# Patient Record
Sex: Female | Born: 1975
Health system: Southern US, Community
[De-identification: ages and names within clinical notes are randomized; demographics above are authoritative.]

## PROBLEM LIST (undated history)

## (undated) DIAGNOSIS — R87619 Unspecified abnormal cytological findings in specimens from cervix uteri: Secondary | ICD-10-CM

## (undated) DIAGNOSIS — F32A Depression, unspecified: Secondary | ICD-10-CM

## (undated) DIAGNOSIS — M1990 Unspecified osteoarthritis, unspecified site: Secondary | ICD-10-CM

## (undated) DIAGNOSIS — R102 Pelvic and perineal pain unspecified side: Secondary | ICD-10-CM

## (undated) DIAGNOSIS — Z9889 Other specified postprocedural states: Secondary | ICD-10-CM

## (undated) DIAGNOSIS — R112 Nausea with vomiting, unspecified: Secondary | ICD-10-CM

## (undated) DIAGNOSIS — E039 Hypothyroidism, unspecified: Secondary | ICD-10-CM

## (undated) DIAGNOSIS — F1910 Other psychoactive substance abuse, uncomplicated: Secondary | ICD-10-CM

## (undated) DIAGNOSIS — F329 Major depressive disorder, single episode, unspecified: Secondary | ICD-10-CM

## (undated) DIAGNOSIS — N809 Endometriosis, unspecified: Secondary | ICD-10-CM

## (undated) DIAGNOSIS — E079 Disorder of thyroid, unspecified: Secondary | ICD-10-CM

## (undated) HISTORY — DX: Endometriosis, unspecified: N80.9

## (undated) HISTORY — PX: RHINOPLASTY: SUR1284

## (undated) HISTORY — DX: Other psychoactive substance abuse, uncomplicated: F19.10

## (undated) HISTORY — PX: APPENDECTOMY: SHX54

## (undated) HISTORY — DX: Unspecified abnormal cytological findings in specimens from cervix uteri: R87.619

## (undated) HISTORY — DX: Disorder of thyroid, unspecified: E07.9

## (undated) HISTORY — PX: WISDOM TOOTH EXTRACTION: SHX21

---

## 2010-01-21 ENCOUNTER — Encounter: Admission: RE | Admit: 2010-01-21 | Discharge: 2010-01-21 | Payer: Self-pay | Admitting: Obstetrics and Gynecology

## 2010-03-23 ENCOUNTER — Ambulatory Visit: Payer: Self-pay | Admitting: Genetic Counselor

## 2010-06-14 ENCOUNTER — Encounter: Payer: Self-pay | Admitting: Obstetrics and Gynecology

## 2010-07-20 ENCOUNTER — Ambulatory Visit
Admission: RE | Admit: 2010-07-20 | Discharge: 2010-07-20 | Disposition: A | Discharge status: Home / Self Care | Payer: BC Managed Care – PPO | Source: Ambulatory Visit | Attending: Family Medicine | Admitting: Family Medicine

## 2010-07-20 ENCOUNTER — Other Ambulatory Visit: Payer: Self-pay | Admitting: Family Medicine

## 2010-08-06 ENCOUNTER — Other Ambulatory Visit (HOSPITAL_COMMUNITY): Payer: Self-pay | Admitting: Obstetrics and Gynecology

## 2010-08-06 DIAGNOSIS — Z09 Encounter for follow-up examination after completed treatment for conditions other than malignant neoplasm: Secondary | ICD-10-CM

## 2010-08-17 ENCOUNTER — Ambulatory Visit
Admission: RE | Admit: 2010-08-17 | Discharge: 2010-08-17 | Disposition: A | Discharge status: Home / Self Care | Payer: BC Managed Care – PPO | Source: Ambulatory Visit | Attending: Obstetrics and Gynecology | Admitting: Obstetrics and Gynecology

## 2010-08-17 DIAGNOSIS — Z09 Encounter for follow-up examination after completed treatment for conditions other than malignant neoplasm: Secondary | ICD-10-CM

## 2011-02-16 ENCOUNTER — Other Ambulatory Visit: Payer: Self-pay | Admitting: Obstetrics and Gynecology

## 2011-02-16 DIAGNOSIS — R920 Mammographic microcalcification found on diagnostic imaging of breast: Secondary | ICD-10-CM

## 2011-03-03 ENCOUNTER — Ambulatory Visit
Admission: RE | Admit: 2011-03-03 | Discharge: 2011-03-03 | Disposition: A | Discharge status: Home / Self Care | Payer: BC Managed Care – PPO | Source: Ambulatory Visit | Attending: Obstetrics and Gynecology | Admitting: Obstetrics and Gynecology

## 2011-03-03 DIAGNOSIS — R920 Mammographic microcalcification found on diagnostic imaging of breast: Secondary | ICD-10-CM

## 2011-04-29 ENCOUNTER — Ambulatory Visit (INDEPENDENT_AMBULATORY_CARE_PROVIDER_SITE_OTHER): Payer: BC Managed Care – PPO

## 2011-04-29 DIAGNOSIS — B9789 Other viral agents as the cause of diseases classified elsewhere: Secondary | ICD-10-CM

## 2011-04-29 DIAGNOSIS — R509 Fever, unspecified: Secondary | ICD-10-CM

## 2011-05-25 HISTORY — PX: LEEP: SHX91

## 2011-08-05 ENCOUNTER — Other Ambulatory Visit: Payer: Self-pay | Admitting: Family Medicine

## 2011-09-14 ENCOUNTER — Other Ambulatory Visit: Payer: Self-pay | Admitting: Physician Assistant

## 2011-09-14 ENCOUNTER — Other Ambulatory Visit: Payer: Self-pay | Admitting: Family Medicine

## 2011-10-04 ENCOUNTER — Ambulatory Visit: Payer: BC Managed Care – PPO

## 2011-10-04 ENCOUNTER — Ambulatory Visit (INDEPENDENT_AMBULATORY_CARE_PROVIDER_SITE_OTHER): Payer: BC Managed Care – PPO | Admitting: Internal Medicine

## 2011-10-04 VITALS — BP 100/68 | HR 81 | Temp 98.0°F | Resp 16 | Ht 66.5 in | Wt 123.0 lb

## 2011-10-04 DIAGNOSIS — S8000XA Contusion of unspecified knee, initial encounter: Secondary | ICD-10-CM

## 2011-10-04 DIAGNOSIS — M25569 Pain in unspecified knee: Secondary | ICD-10-CM

## 2011-10-04 MED ORDER — MELOXICAM 15 MG PO TABS: 15.0000 mg | ORAL_TABLET | Freq: Every day | ORAL | Status: DC

## 2011-10-04 NOTE — Progress Notes (Signed)
  Subjective:    Patient ID: Crystal Mcknight, female    DOB: Dec 23, 1975, 36 y.o.   MRN: [id number]  HPIDuring an incident with her dog her left knee was slammed into a gait yesterday She has pain and swelling with difficulty weightbearing She is an avid athlete    Review of Systems     Objective:   Physical Exam The left knee has a slight effusion under the patella. There is slight ecchymosis along the lateral patellar but she is most tender on the medial aspect of the patella and up into the medial aspect of the left anterior thigh/There is no laxity to valgus varus or drawer/she has pain throughout her full range of motion and can flex past 90 and fully extend/any patellar ballottement is uncomfortable     [facility] reading (PRIMARY) by  Dr. Merla Riches no fx or patellar disloc     Assessment & Plan:  Problem #1 contusion patellar with strain of the medial muscles about the knee Mobic 15 mg daily Ice 20 minutes 3 times a day Continue range of motion  advance activities as tolerated Recheck in 7-10 days if not improving

## 2011-11-29 ENCOUNTER — Ambulatory Visit (INDEPENDENT_AMBULATORY_CARE_PROVIDER_SITE_OTHER): Payer: BC Managed Care – PPO | Admitting: Family Medicine

## 2011-11-29 ENCOUNTER — Telehealth: Payer: Self-pay

## 2011-11-29 VITALS — BP 128/86 | HR 87 | Temp 98.1°F | Resp 16 | Ht 66.78 in | Wt 125.4 lb

## 2011-11-29 DIAGNOSIS — J029 Acute pharyngitis, unspecified: Secondary | ICD-10-CM

## 2011-11-29 DIAGNOSIS — F411 Generalized anxiety disorder: Secondary | ICD-10-CM

## 2011-11-29 DIAGNOSIS — F419 Anxiety disorder, unspecified: Secondary | ICD-10-CM

## 2011-11-29 MED ORDER — ALPRAZOLAM 0.25 MG PO TABS: 0.2500 mg | ORAL_TABLET | Freq: Every day | ORAL | Status: DC | PRN

## 2011-11-29 MED ORDER — FLUCONAZOLE 150 MG PO TABS: 150.0000 mg | ORAL_TABLET | Freq: Once | ORAL | Status: AC

## 2011-11-29 MED ORDER — CEFDINIR 300 MG PO CAPS: 600.0000 mg | ORAL_CAPSULE | Freq: Every day | ORAL | Status: AC

## 2011-11-29 NOTE — Telephone Encounter (Signed)
[place] HAVE QUESTIONS REGARDING PT'S MEDICINE. PLEASE CALL [phone]

## 2011-11-29 NOTE — Progress Notes (Signed)
@[email or web address] @   Patient ID: Crystal Mcknight MRN: [id number], DOB: 1975/07/26, 36 y.o. Date of Encounter: 11/29/2011, 2:09 PM  Primary Physician: No primary provider on file.  Chief Complaint:  Chief Complaint  Patient presents with  . Sore Throat    x 3 days    Has had strep twice before this year, most recently in May  Also requests refill on Xanax which she takes "rarely" HPI: 36 y.o. year old female presents with day history of sore throat. Subjective fever and chills. No cough, congestion, rhinorrhea, sinus pressure, otalgia, or headache. Normal hearing. No GI complaints. Able to swallow saliva, but hurts to do so. Decreased appetite secondary to sore throat.   No past medical history on file.   Home Meds: Prior to Admission medications   Medication Sig Start Date End Date Taking? Authorizing Provider  LEVOTHROID 150 MCG tablet TAKE 1 TABLET DAILY. TAKE A TABLET WITH THESE ON TUESDAY AND SATURDAYS. 08/05/11  Yes Morrell Riddle, PA-C  LEVOTHROID 25 MCG tablet TAKE 1 TABLET ONCE DAILY ON TUESDAYS AND SATURDAYS ONLY 09/14/11  Yes Ryan M Dunn, PA-C  sertraline (ZOLOFT) 25 MG tablet Take 25 mg by mouth daily.   Yes Historical Provider, MD  ALPRAZolam (XANAX) 0.25 MG tablet Take 1 tablet (0.25 mg total) by mouth daily as needed for sleep. 11/29/11 12/29/11  Elvina Sidle, MD  cefdinir (OMNICEF) 300 MG capsule Take 2 capsules (600 mg total) by mouth daily. 11/29/11 12/09/11  Elvina Sidle, MD  fluconazole (DIFLUCAN) 150 MG tablet Take 1 tablet (150 mg total) by mouth once. 11/29/11 11/30/11  Elvina Sidle, MD    Allergies:  Allergies  Allergen Reactions  . Penicillins Rash    History   Social History  . Marital Status: Married    Spouse Name: N/A    Number of Children: N/A  . Years of Education: N/A   Occupational History  . Not on file.   Social History Main Topics  . Smoking status: Never Smoker   . Smokeless tobacco: Not on file  . Alcohol Use: Not on file  . Drug  Use: Not on file  . Sexually Active: Not on file   Other Topics Concern  . Not on file   Social History Narrative  . No narrative on file     Review of Systems: Constitutional: negative for chills, fever, night sweats or weight changes HEENT: see above Cardiovascular: negative for chest pain or palpitations Respiratory: negative for hemoptysis, wheezing, or shortness of breath Abdominal: negative for abdominal pain, nausea, vomiting or diarrhea Dermatological: negative for rash Neurologic: negative for headache   Physical Exam: Blood pressure 128/86, pulse 87, temperature 98.1 F (36.7 C), temperature source Oral, resp. rate 16, height 5' 6.78" (1.696 m), weight 125 lb 6.4 oz (56.881 kg), last menstrual period 11/12/2011, SpO2 100.00%., Body mass index is 19.77 kg/(m^2). General: Well developed, well nourished, in no acute distress. Head: Normocephalic, atraumatic, eyes without discharge, sclera non-icteric, nares are patent. Bilateral auditory canals clear, TM's are without perforation, pearly grey with reflective cone of light bilaterally. No sinus TTP. Oral cavity moist, dentition normal. Posterior pharynx with post nasal drip and mild erythema. No peritonsillar abscess or tonsillar exudate. Neck: Supple. No thyromegaly. Full ROM. No lymphadenopathy. Lungs: Clear bilaterally to auscultation without wheezes, rales, or rhonchi. Breathing is unlabored. Heart: RRR with S1 S2. No murmurs, rubs, or gallops appreciated. Msk:  Strength and tone normal for age. Extremities: No clubbing or cyanosis. No edema. Neuro: Alert  and oriented X 3. Moves all extremities spontaneously. CNII-XII grossly in tact. Psych:  Responds to questions appropriately with a normal affect.   Labs:   ASSESSMENT AND PLAN:  36 y.o. year old female with recurrent pharyngitis 1. Pharyngitis  cefdinir (OMNICEF) 300 MG capsule, fluconazole (DIFLUCAN) 150 MG tablet  2. Anxiety  ALPRAZolam (XANAX) 0.25 MG tablet     - -Tylenol/Motrin prn -Rest/fluids -RTC precautions -RTC 3-5 days if no improvement  Signed, Elvina Sidle, MD 11/29/2011 2:09 PM

## 2011-11-30 NOTE — Telephone Encounter (Signed)
Pharmacist stated that the pt was able to answer their ? No further ?s.

## 2012-03-22 ENCOUNTER — Ambulatory Visit (INDEPENDENT_AMBULATORY_CARE_PROVIDER_SITE_OTHER): Payer: BC Managed Care – PPO | Admitting: Emergency Medicine

## 2012-03-22 ENCOUNTER — Ambulatory Visit: Payer: BC Managed Care – PPO

## 2012-03-22 VITALS — BP 122/76 | HR 83 | Temp 99.0°F | Resp 17 | Ht 67.0 in | Wt 126.0 lb

## 2012-03-22 DIAGNOSIS — R05 Cough: Secondary | ICD-10-CM

## 2012-03-22 DIAGNOSIS — B373 Candidiasis of vulva and vagina: Secondary | ICD-10-CM

## 2012-03-22 DIAGNOSIS — J40 Bronchitis, not specified as acute or chronic: Secondary | ICD-10-CM

## 2012-03-22 DIAGNOSIS — R059 Cough, unspecified: Secondary | ICD-10-CM

## 2012-03-22 DIAGNOSIS — B3731 Acute candidiasis of vulva and vagina: Secondary | ICD-10-CM

## 2012-03-22 LAB — POCT CBC
Granulocyte percent: 58.2 %G (ref 37–80)
HCT, POC: 43.7 % (ref 37.7–47.9)
Hemoglobin: 13.6 g/dL (ref 12.2–16.2)
MCHC: 31.1 g/dL — AB (ref 31.8–35.4)
MID (cbc): 0.5 (ref 0–0.9)
POC Granulocyte: 4.1 (ref 2–6.9)
POC LYMPH PERCENT: 35.1 %L (ref 10–50)
POC MID %: 6.7 %M (ref 0–12)
Platelet Count, POC: 185 10*3/uL (ref 142–424)
RBC: 4.21 M/uL (ref 4.04–5.48)

## 2012-03-22 MED ORDER — FLUCONAZOLE 150 MG PO TABS: 150.0000 mg | ORAL_TABLET | Freq: Once | ORAL | Status: DC

## 2012-03-22 MED ORDER — AZITHROMYCIN 250 MG PO TABS: ORAL_TABLET | ORAL | Status: DC

## 2012-03-22 MED ORDER — HYDROCODONE-HOMATROPINE 5-1.5 MG/5ML PO SYRP: 5.0000 mL | ORAL_SOLUTION | Freq: Three times a day (TID) | ORAL | Status: DC | PRN

## 2012-03-22 NOTE — Patient Instructions (Addendum)

## 2012-03-22 NOTE — Progress Notes (Signed)
  Subjective:    Patient ID: Crystal Mcknight, female    DOB: April 09, 1976, 36 y.o.   MRN: [id number]  HPI patient presents with a cough and body aches.  Kids have had strep but she doesn't have a sore throat.  Been coughing for two weeks.  Has taken robitussin, nyquil and mucinex.  Has not had the flu shot this year.  Right side of ear and face has been hurting.      Review of Systems     Objective:   Physical Exam H. EENT exam is unremarkable. Her neck is supple. Her chest is clear to both auscultation and percussion. I did not hear any audible wheezes. There were no areas of dullness to percussion. Her cardiac exam is unremarkable  Results for orders placed in visit on 03/22/12  POCT RAPID STREP A (OFFICE)      Component Value Range   Rapid Strep A Screen Negative  Negative  POCT CBC      Component Value Range   WBC 7.1  4.6 - 10.2 K/uL   Lymph, poc 2.5  0.6 - 3.4   POC LYMPH PERCENT 35.1  10 - 50 %L   MID (cbc) 0.5  0 - 0.9   POC MID % 6.7  0 - 12 %M   POC Granulocyte 4.1  2 - 6.9   Granulocyte percent 58.2  37 - 80 %G   RBC 4.21  4.04 - 5.48 M/uL   Hemoglobin 13.6  12.2 - 16.2 g/dL   HCT, POC 40.9  81.1 - 47.9 %   MCV 103.9 (*) 80 - 97 fL   MCH, POC 32.3 (*) 27 - 31.2 pg   MCHC 31.1 (*) 31.8 - 35.4 g/dL   RDW, POC 91.4     Platelet Count, POC 185  142 - 424 K/uL   MPV 9.1  0 - 99.8 fL   [facility] reading (PRIMARY) by  Dr. Cleta Alberts no acute disease     Assessment & Plan:  There is no pneumonia on x-ray she has a dry nonproductive cough. This could be an atypical infection. We'll treat with Z-Pak and Hycodan at night.

## 2012-03-23 LAB — FOLATE: Folate: >20 ng/mL

## 2012-03-23 LAB — VITAMIN B12: Vitamin B-12: 807 pg/mL (ref 211–911)

## 2012-04-18 ENCOUNTER — Other Ambulatory Visit: Payer: Self-pay | Admitting: Obstetrics and Gynecology

## 2012-04-18 DIAGNOSIS — R921 Mammographic calcification found on diagnostic imaging of breast: Secondary | ICD-10-CM

## 2012-05-03 ENCOUNTER — Ambulatory Visit
Admission: RE | Admit: 2012-05-03 | Discharge: 2012-05-03 | Disposition: A | Discharge status: Home / Self Care | Payer: BC Managed Care – PPO | Source: Ambulatory Visit | Attending: Obstetrics and Gynecology | Admitting: Obstetrics and Gynecology

## 2012-05-03 DIAGNOSIS — R921 Mammographic calcification found on diagnostic imaging of breast: Secondary | ICD-10-CM

## 2012-09-28 DIAGNOSIS — M19049 Primary osteoarthritis, unspecified hand: Secondary | ICD-10-CM | POA: Insufficient documentation

## 2012-09-28 DIAGNOSIS — Q740 Other congenital malformations of upper limb(s), including shoulder girdle: Secondary | ICD-10-CM | POA: Insufficient documentation

## 2012-10-18 ENCOUNTER — Telehealth: Payer: Self-pay

## 2012-10-18 NOTE — Telephone Encounter (Signed)
Okay to refill alprazolam x 6 month.  I am out of office for a week.  Can you help me?

## 2012-10-18 NOTE — Telephone Encounter (Signed)
Pharm requests RF of alprazolam 0.25 mg. 

## 2012-10-19 ENCOUNTER — Other Ambulatory Visit: Payer: Self-pay | Admitting: Family Medicine

## 2012-10-19 DIAGNOSIS — F419 Anxiety disorder, unspecified: Secondary | ICD-10-CM

## 2012-10-19 MED ORDER — ALPRAZOLAM 0.25 MG PO TABS: 0.2500 mg | ORAL_TABLET | Freq: Every day | ORAL | Status: AC | PRN

## 2012-10-19 NOTE — Telephone Encounter (Signed)
Will forward to Dr. Elbert Ewings as he is here for clinic

## 2012-10-25 NOTE — Telephone Encounter (Signed)
Rx sent in 10/19/12.

## 2013-03-06 ENCOUNTER — Other Ambulatory Visit: Payer: Self-pay | Admitting: Obstetrics and Gynecology

## 2013-03-06 DIAGNOSIS — R922 Inconclusive mammogram: Secondary | ICD-10-CM

## 2013-03-19 ENCOUNTER — Other Ambulatory Visit: Payer: Self-pay | Admitting: Gastroenterology

## 2013-03-19 DIAGNOSIS — R1032 Left lower quadrant pain: Secondary | ICD-10-CM

## 2013-03-23 ENCOUNTER — Ambulatory Visit
Admission: RE | Admit: 2013-03-23 | Discharge: 2013-03-23 | Disposition: A | Discharge status: Home / Self Care | Payer: BC Managed Care – PPO | Source: Ambulatory Visit | Attending: Gastroenterology | Admitting: Gastroenterology

## 2013-03-23 DIAGNOSIS — R1032 Left lower quadrant pain: Secondary | ICD-10-CM

## 2013-03-23 MED ORDER — IOHEXOL 300 MG/ML  SOLN
100.0000 mL | Freq: Once | INTRAMUSCULAR | Status: AC | PRN
  Administered 2013-03-23: 100 mL via INTRAVENOUS

## 2013-04-25 ENCOUNTER — Other Ambulatory Visit: Payer: Self-pay | Admitting: Radiology

## 2013-04-30 ENCOUNTER — Other Ambulatory Visit: Payer: Self-pay | Admitting: Radiology

## 2013-05-21 ENCOUNTER — Encounter (HOSPITAL_COMMUNITY): Payer: Self-pay | Admitting: Pharmacist

## 2013-05-29 ENCOUNTER — Inpatient Hospital Stay (HOSPITAL_COMMUNITY)
Admission: RE | Admit: 2013-05-29 | Discharge: 2013-05-29 | Disposition: A | Discharge status: Home / Self Care | Payer: BC Managed Care – PPO | Source: Ambulatory Visit

## 2013-05-29 ENCOUNTER — Encounter (HOSPITAL_COMMUNITY): Payer: Self-pay | Admitting: *Deleted

## 2013-05-29 NOTE — Pre-Procedure Instructions (Signed)
Patient no showed for 05/29/12 PAT appt. At 130pm.  Pamala Hurry in Admitting tried to contact patient but no response.

## 2013-05-30 MED ORDER — GENTAMICIN SULFATE 40 MG/ML IJ SOLN
INTRAVENOUS | Status: AC
  Administered 2013-05-31: 113 mL via INTRAVENOUS
  Filled 2013-05-30: qty 7.25

## 2013-05-30 NOTE — H&P (Addendum)
38 yo G1 with chronic pelvic pain presents for diagnostic laparoscopy  PMHx:  Pelvic pain, hypothyroidism, arthritis PSHx:  Appy, SVD x 1, leep All: PCN Meds:  Levothyroxine, zoloft, vitamins, vicoprofen SHx:  occ etoh, no tobacco FHx:  Breast ca  AF, VSS Gen - NAD Abd - soft, NT CV - RRR Lungs - clear PV - nml EFG, uterus with limited mobility, no adnexal masses  A/P:  chornic pelvic pain Diagnostic l/s with possible fulgeration of endometriosis or LOA R/b/a discussed, questions answered, informed consent

## 2013-05-31 ENCOUNTER — Encounter (HOSPITAL_COMMUNITY)
Admission: RE | Disposition: A | Discharge status: Home / Self Care | Payer: Self-pay | Source: Ambulatory Visit | Attending: Obstetrics and Gynecology

## 2013-05-31 ENCOUNTER — Ambulatory Visit (HOSPITAL_COMMUNITY): Payer: BC Managed Care – PPO | Admitting: Anesthesiology

## 2013-05-31 ENCOUNTER — Ambulatory Visit (HOSPITAL_COMMUNITY)
Admission: RE | Admit: 2013-05-31 | Discharge: 2013-05-31 | Disposition: A | Discharge status: Home / Self Care | Payer: BC Managed Care – PPO | Source: Ambulatory Visit | Attending: Obstetrics and Gynecology | Admitting: Obstetrics and Gynecology

## 2013-05-31 ENCOUNTER — Encounter (HOSPITAL_COMMUNITY): Payer: Self-pay | Admitting: *Deleted

## 2013-05-31 ENCOUNTER — Encounter (HOSPITAL_COMMUNITY): Payer: BC Managed Care – PPO | Admitting: Anesthesiology

## 2013-05-31 DIAGNOSIS — N949 Unspecified condition associated with female genital organs and menstrual cycle: Secondary | ICD-10-CM | POA: Insufficient documentation

## 2013-05-31 DIAGNOSIS — N803 Endometriosis of pelvic peritoneum, unspecified: Secondary | ICD-10-CM | POA: Insufficient documentation

## 2013-05-31 DIAGNOSIS — N838 Other noninflammatory disorders of ovary, fallopian tube and broad ligament: Secondary | ICD-10-CM | POA: Insufficient documentation

## 2013-05-31 DIAGNOSIS — G8929 Other chronic pain: Secondary | ICD-10-CM | POA: Insufficient documentation

## 2013-05-31 HISTORY — DX: Unspecified osteoarthritis, unspecified site: M19.90

## 2013-05-31 HISTORY — PX: LAPAROSCOPY: SHX197

## 2013-05-31 HISTORY — DX: Hypothyroidism, unspecified: E03.9

## 2013-05-31 HISTORY — DX: Other specified postprocedural states: R11.2

## 2013-05-31 HISTORY — DX: Other specified postprocedural states: Z98.890

## 2013-05-31 HISTORY — DX: Major depressive disorder, single episode, unspecified: F32.9

## 2013-05-31 HISTORY — DX: Pelvic and perineal pain unspecified side: R10.20

## 2013-05-31 HISTORY — DX: Depression, unspecified: F32.A

## 2013-05-31 HISTORY — DX: Pelvic and perineal pain: R10.2

## 2013-05-31 HISTORY — DX: Nausea with vomiting, unspecified: Z98.890

## 2013-05-31 LAB — CBC
HCT: 41.4 % (ref 36.0–46.0)
Hemoglobin: 14.6 g/dL (ref 12.0–15.0)
MCH: 33.7 pg (ref 26.0–34.0)
MCHC: 35.3 g/dL (ref 30.0–36.0)
MCV: 95.6 fL (ref 78.0–100.0)
Platelets: 198 10*3/uL (ref 150–400)
RBC: 4.33 MIL/uL (ref 3.87–5.11)
RDW: 11.8 % (ref 11.5–15.5)
WBC: 6 10*3/uL (ref 4.0–10.5)

## 2013-05-31 LAB — PREGNANCY, URINE: PREG TEST UR: NEGATIVE

## 2013-05-31 SURGERY — LAPAROSCOPY OPERATIVE
Anesthesia: General | Site: Abdomen

## 2013-05-31 MED ORDER — BUPIVACAINE HCL (PF) 0.25 % IJ SOLN
INTRAMUSCULAR | Status: AC
  Filled 2013-05-31: qty 30

## 2013-05-31 MED ORDER — LACTATED RINGERS IV SOLN
INTRAVENOUS | Status: DC
  Administered 2013-05-31: 1000 mL via INTRAVENOUS
  Administered 2013-05-31 (×2): via INTRAVENOUS

## 2013-05-31 MED ORDER — PROPOFOL 10 MG/ML IV BOLUS
INTRAVENOUS | Status: DC | PRN
  Administered 2013-05-31: 150 mg via INTRAVENOUS

## 2013-05-31 MED ORDER — DEXAMETHASONE SODIUM PHOSPHATE 4 MG/ML IJ SOLN
INTRAMUSCULAR | Status: DC | PRN
  Administered 2013-05-31: 10 mg via INTRAVENOUS

## 2013-05-31 MED ORDER — ONDANSETRON HCL 4 MG/2ML IJ SOLN
INTRAMUSCULAR | Status: DC | PRN
  Administered 2013-05-31: 4 mg via INTRAVENOUS

## 2013-05-31 MED ORDER — FENTANYL CITRATE 0.05 MG/ML IJ SOLN
INTRAMUSCULAR | Status: AC
  Filled 2013-05-31: qty 5

## 2013-05-31 MED ORDER — ROCURONIUM BROMIDE 100 MG/10ML IV SOLN
INTRAVENOUS | Status: AC
  Filled 2013-05-31: qty 1

## 2013-05-31 MED ORDER — ROCURONIUM BROMIDE 100 MG/10ML IV SOLN
INTRAVENOUS | Status: DC | PRN
  Administered 2013-05-31: 20 mg via INTRAVENOUS
  Administered 2013-05-31: 10 mg via INTRAVENOUS

## 2013-05-31 MED ORDER — NEOSTIGMINE METHYLSULFATE 1 MG/ML IJ SOLN
INTRAMUSCULAR | Status: DC | PRN
  Administered 2013-05-31: 3 mg via INTRAVENOUS

## 2013-05-31 MED ORDER — MIDAZOLAM HCL 2 MG/2ML IJ SOLN
INTRAMUSCULAR | Status: DC | PRN
  Administered 2013-05-31: 2 mg via INTRAVENOUS

## 2013-05-31 MED ORDER — NEOSTIGMINE METHYLSULFATE 1 MG/ML IJ SOLN
INTRAMUSCULAR | Status: AC
  Filled 2013-05-31: qty 1

## 2013-05-31 MED ORDER — BUPIVACAINE HCL (PF) 0.25 % IJ SOLN
INTRAMUSCULAR | Status: DC | PRN
  Administered 2013-05-31: 5 mL

## 2013-05-31 MED ORDER — MIDAZOLAM HCL 2 MG/2ML IJ SOLN
INTRAMUSCULAR | Status: AC
  Filled 2013-05-31: qty 2

## 2013-05-31 MED ORDER — PHENYLEPHRINE HCL 10 MG/ML IJ SOLN
INTRAMUSCULAR | Status: DC | PRN
  Administered 2013-05-31: 40 ug via INTRAVENOUS

## 2013-05-31 MED ORDER — GLYCOPYRROLATE 0.2 MG/ML IJ SOLN
INTRAMUSCULAR | Status: DC | PRN
  Administered 2013-05-31: 0.6 mg via INTRAVENOUS

## 2013-05-31 MED ORDER — ONDANSETRON HCL 4 MG/2ML IJ SOLN
INTRAMUSCULAR | Status: AC
  Filled 2013-05-31: qty 2

## 2013-05-31 MED ORDER — FENTANYL CITRATE 0.05 MG/ML IJ SOLN
INTRAMUSCULAR | Status: AC
  Filled 2013-05-31: qty 2

## 2013-05-31 MED ORDER — METOCLOPRAMIDE HCL 5 MG/ML IJ SOLN
INTRAMUSCULAR | Status: DC | PRN
  Administered 2013-05-31: 5 mg via INTRAVENOUS

## 2013-05-31 MED ORDER — METOCLOPRAMIDE HCL 5 MG/ML IJ SOLN
INTRAMUSCULAR | Status: AC
  Filled 2013-05-31: qty 2

## 2013-05-31 MED ORDER — GLYCOPYRROLATE 0.2 MG/ML IJ SOLN
INTRAMUSCULAR | Status: AC
  Filled 2013-05-31: qty 3

## 2013-05-31 MED ORDER — MIDAZOLAM HCL 2 MG/2ML IJ SOLN: INTRAMUSCULAR | Status: DC | PRN

## 2013-05-31 MED ORDER — LIDOCAINE HCL (CARDIAC) 20 MG/ML IV SOLN
INTRAVENOUS | Status: DC | PRN
  Administered 2013-05-31: 50 mg via INTRAVENOUS

## 2013-05-31 MED ORDER — KETOROLAC TROMETHAMINE 30 MG/ML IJ SOLN
INTRAMUSCULAR | Status: DC | PRN
  Administered 2013-05-31: 30 mg via INTRAVENOUS

## 2013-05-31 MED ORDER — FENTANYL CITRATE 0.05 MG/ML IJ SOLN
25.0000 ug | INTRAMUSCULAR | Status: DC | PRN
  Administered 2013-05-31: 50 ug via INTRAVENOUS

## 2013-05-31 MED ORDER — DEXAMETHASONE SODIUM PHOSPHATE 10 MG/ML IJ SOLN
INTRAMUSCULAR | Status: AC
  Filled 2013-05-31: qty 1

## 2013-05-31 MED ORDER — FENTANYL CITRATE 0.05 MG/ML IJ SOLN
INTRAMUSCULAR | Status: DC | PRN
  Administered 2013-05-31 (×5): 50 ug via INTRAVENOUS

## 2013-05-31 MED ORDER — KETOROLAC TROMETHAMINE 30 MG/ML IJ SOLN
INTRAMUSCULAR | Status: AC
  Filled 2013-05-31: qty 1

## 2013-05-31 SURGICAL SUPPLY — 30 items
BARRIER ADHS 3X4 INTERCEED (GAUZE/BANDAGES/DRESSINGS) IMPLANT
CABLE HIGH FREQUENCY MONO STRZ (ELECTRODE) ×3 IMPLANT
CATH ROBINSON RED A/P 16FR (CATHETERS) ×3 IMPLANT
CHLORAPREP W/TINT 26ML (MISCELLANEOUS) ×6 IMPLANT
CLOSURE WOUND 1/2 X4 (GAUZE/BANDAGES/DRESSINGS)
CLOTH BEACON ORANGE TIMEOUT ST (SAFETY) ×3 IMPLANT
DERMABOND ADVANCED (GAUZE/BANDAGES/DRESSINGS) ×2
DERMABOND ADVANCED .7 DNX12 (GAUZE/BANDAGES/DRESSINGS) ×1 IMPLANT
GLOVE BIO SURGEON STRL SZ 6.5 (GLOVE) ×2 IMPLANT
GLOVE BIO SURGEONS STRL SZ 6.5 (GLOVE) ×1
GLOVE BIOGEL PI IND STRL 7.0 (GLOVE) ×1 IMPLANT
GLOVE BIOGEL PI INDICATOR 7.0 (GLOVE) ×2
GOWN PREVENTION PLUS LG XLONG (DISPOSABLE) ×6 IMPLANT
NS IRRIG 1000ML POUR BTL (IV SOLUTION) ×3 IMPLANT
PACK LAPAROSCOPY BASIN (CUSTOM PROCEDURE TRAY) ×3 IMPLANT
POUCH SPECIMEN RETRIEVAL 10MM (ENDOMECHANICALS) IMPLANT
PROTECTOR NERVE ULNAR (MISCELLANEOUS) ×3 IMPLANT
SEALER TISSUE G2 CVD JAW 35 (ENDOMECHANICALS) IMPLANT
SEALER TISSUE G2 CVD JAW 45CM (ENDOMECHANICALS) IMPLANT
SET IRRIG TUBING LAPAROSCOPIC (IRRIGATION / IRRIGATOR) IMPLANT
STRIP CLOSURE SKIN 1/2X4 (GAUZE/BANDAGES/DRESSINGS) IMPLANT
SUT VIC AB 3-0 PS2 18 (SUTURE) ×2
SUT VIC AB 3-0 PS2 18XBRD (SUTURE) ×1 IMPLANT
SUT VICRYL 0 UR6 27IN ABS (SUTURE) ×3 IMPLANT
TOWEL OR 17X24 6PK STRL BLUE (TOWEL DISPOSABLE) ×6 IMPLANT
TROCAR OPTI TIP 5M 100M (ENDOMECHANICALS) ×3 IMPLANT
TROCAR XCEL NON-BLD 11X100MML (ENDOMECHANICALS) ×3 IMPLANT
TROCAR XCEL OPT SLVE 5M 100M (ENDOMECHANICALS) IMPLANT
WARMER LAPAROSCOPE (MISCELLANEOUS) ×3 IMPLANT
WATER STERILE IRR 1000ML POUR (IV SOLUTION) ×3 IMPLANT

## 2013-05-31 NOTE — Discharge Instructions (Signed)
DISCHARGE INSTRUCTIONS: Laparoscopy  MAY TAKE IBUPROFEN (MOTRIN, ADVIL) OR ALEVE AFTER 2:00 PM FOR CRAMPS OR TENDERNESS!!  The following instructions have been prepared to help you care for yourself upon your return home today.  Wound care:  Do not get the incision wet for the first 24 hours. The incision should be kept clean and dry.  The Band-Aids or dressings may be removed the day after surgery.  Should the incision become sore, red, and swollen after the first week, check with your doctor.  Personal hygiene:  Shower the day after your procedure.  Activity and limitations:  Do NOT drive or operate any equipment today.  Do NOT lift anything more than 15 pounds for 2-3 weeks after surgery.  Do NOT rest in bed all day.  Walking is encouraged. Walk each day, starting slowly with 5-minute walks 3 or 4 times a day. Slowly increase the length of your walks.  Walk up and down stairs slowly.  Do NOT do strenuous activities, such as golfing, playing tennis, bowling, running, biking, weight lifting, gardening, mowing, or vacuuming for 2-4 weeks. Ask your doctor when it is okay to start.  Diet: Eat a light meal as desired this evening. You may resume your usual diet tomorrow.  Return to work: This is dependent on the type of work you do. For the most part you can return to a desk job within a week of surgery. If you are more active at work, please discuss this with your doctor.  What to expect after your surgery: You may have a slight burning sensation when you urinate on the first day. You may have a very small amount of blood in the urine. Expect to have a small amount of vaginal discharge/light bleeding for 1-2 weeks. It is not unusual to have abdominal soreness and bruising for up to 2 weeks. You may be tired and need more rest for about 1 week. You may experience shoulder pain for 24-72 hours. Lying flat in bed may relieve it.  Call your doctor for any of the following:  Develop  a fever of 100.4 or greater  Inability to urinate 6 hours after discharge from hospital  Severe pain not relieved by pain medications  Persistent of heavy bleeding at incision site  Redness or swelling around incision site after a week  Increasing nausea or vomiting  Patient Signature________________________________________ Nurse Signature_________________________________________   AFTER THE PROCEDURE   The gas is released from inside the abdomen.  The incisions are closed with stitches (sutures). Because these incisions are small (usually less than 1/2 inch), there is usually minimal discomfort after the procedure. There may be some mild discomfort in the throat. This is from the tube placed in the throat while you were sleeping. You may have some mild abdominal discomfort. There may also be discomfort from the instrument placement incisions in the abdomen.  The recovery time is shortened as long as there are no complications.  You will rest in a recovery room until stable and doing well. As long as there are no complications, you may be allowed to go home. FINDING OUT THE RESULTS OF YOUR TEST Not all test results are available during your visit. If your test results are not back during the visit, make an appointment with your caregiver to find out the results. Do not assume everything is normal if you have not heard from your caregiver or the medical facility. It is important for you to follow up on all of your test  results. HOME CARE INSTRUCTIONS   Take all medicines as directed.  Only take over-the-counter or prescription medicines for pain, discomfort, or fever as directed by your caregiver.  Resume daily activities as directed.  Showers are preferred over baths.  You may resume sexual activities in 1 week or as directed.  Do not drive while taking narcotics. SEEK MEDICAL CARE IF:   There is increasing abdominal pain.  There is new pain in the shoulders (shoulder  strap areas).  You feel lightheaded or faint.  You have the chills.  You or your child has an oral temperature above 102 F (38.9 C).  There is pus-like (purulent) drainage from any of the wounds.  You are unable to pass gas or have a bowel movement.  You feel sick to your stomach (nauseous) or throw up (vomit). MAKE SURE YOU:   Understand these instructions.  Will watch your condition.  Will get help right away if you are not doing well or get worse. Document Released: 08/16/2000 Document Revised: 09/04/2012 Document Reviewed: 05/10/2007 [facility] Patient Information 2014 [place], [place].

## 2013-05-31 NOTE — Anesthesia Postprocedure Evaluation (Signed)
  Anesthesia Post Note  Patient: Crystal Mcknight  Procedure(s) Performed: Procedure(s) (LRB): LAPAROSCOPY OPERATIVE, FULGURATION OF ENDOMETRIOSIS, REMOVAL OF BILATERAL FALLOPIAN TUBE CYST (N/A)  Anesthesia type: GA  Patient location: PACU  Post pain: Pain level controlled  Post assessment: Post-op Vital signs reviewed  Last Vitals:  Filed Vitals:   05/31/13 0821  BP: 116/80  Pulse: 83  Temp: 36.4 C  Resp: 20    Post vital signs: Reviewed  Level of consciousness: sedated  Complications: No apparent anesthesia complications

## 2013-05-31 NOTE — Addendum Note (Signed)
Addendum created 05/31/13 0847 by Georgeanne Nim, CRNA   Modules edited: Anesthesia Events, Anesthesia Flowsheet, Anesthesia LDA, Anesthesia Review and Sign Navigator Section

## 2013-05-31 NOTE — Transfer of Care (Signed)
Immediate Anesthesia Transfer of Care Note  Patient: Crystal Mcknight  Procedure(s) Performed: Procedure(s): LAPAROSCOPY OPERATIVE, FULGURATION OF ENDOMETRIOSIS, REMOVAL OF BILATERAL FALLOPIAN TUBE CYST (N/A)  Patient Location: PACU  Anesthesia Type:General  Level of Consciousness: awake, alert , oriented and patient cooperative  Airway & Oxygen Therapy: Patient Spontanous Breathing and Patient connected to nasal cannula oxygen  Post-op Assessment: Report given to PACU RN and Post -op Vital signs reviewed and stable  Post vital signs: stable  Complications: No apparent anesthesia complications

## 2013-05-31 NOTE — Anesthesia Preprocedure Evaluation (Signed)
Anesthesia Evaluation  Patient identified by MRN, date of birth, ID band Patient awake    Reviewed: Allergy & Precautions, H&P , Patient's Chart, lab work & pertinent test results, reviewed documented beta blocker date and time   History of Anesthesia Complications (+) PONV and history of anesthetic complications  Airway Mallampati: II TM Distance: >3 FB Neck ROM: full    Dental no notable dental hx.    Pulmonary  breath sounds clear to auscultation  Pulmonary exam normal       Cardiovascular Rhythm:regular Rate:Normal     Neuro/Psych    GI/Hepatic   Endo/Other  Hypothyroidism   Renal/GU      Musculoskeletal   Abdominal   Peds  Hematology   Anesthesia Other Findings   Reproductive/Obstetrics                           Anesthesia Physical Anesthesia Plan  ASA: II  Anesthesia Plan: General   Post-op Pain Management:    Induction: Intravenous  Airway Management Planned: Oral ETT  Additional Equipment:   Intra-op Plan:   Post-operative Plan: Extubation in OR  Informed Consent: I have reviewed the patients History and Physical, chart, labs and discussed the procedure including the risks, benefits and alternatives for the proposed anesthesia with the patient or authorized representative who has indicated his/her understanding and acceptance.   Dental Advisory Given and Dental advisory given  Plan Discussed with: CRNA and Surgeon  Anesthesia Plan Comments: (  Discussed general anesthesia, including possible nausea, instrumentation of airway, sore throat,pulmonary aspiration, etc. I asked if the were any outstanding questions, or  concerns before we proceeded. )        Anesthesia Quick Evaluation

## 2013-06-01 ENCOUNTER — Encounter (HOSPITAL_COMMUNITY): Payer: Self-pay | Admitting: Obstetrics and Gynecology

## 2013-06-01 NOTE — Op Note (Signed)
NAMECLAUDIE, Crystal Mcknight            ACCOUNT NO.:  [id number]  MEDICAL RECORD NO.:  [id number]  LOCATION:  [facility]                          FACILITY:  [facility]  PHYSICIAN:  Marylynn Pearson, MD    DATE OF BIRTH:  12/28/1975  DATE OF PROCEDURE:  05/31/2013 DATE OF DISCHARGE:                              OPERATIVE REPORT   PREOPERATIVE DIAGNOSIS:  Pelvic pain.  POSTOPERATIVE DIAGNOSES: 1. Pelvic pain. 2. Endometriosis. 3. Bilateral fallopian tube cysts.  PROCEDURE: 1. Laparoscopy. 2. Fulguration of endometriosis. 3. Resection of bilateral fallopian tube cysts.  SURGEON:  Marylynn Pearson, MD.  ANESTHESIA:  General.  SPECIMENS:  Fallopian tube cysts.  COMPLICATIONS:  None.  CONDITION:  Stable to recovery room.  PROCEDURE IN DETAIL:  The patient was taken to the operating room. After informed consent was obtained, she was given general anesthesia, placed in the dorsal lithotomy position using Allen stirrups.  She was prepped and draped in sterile fashion.  An in-out catheter was used to drain her bladder for an unmeasured amount of urine.  A bivalve speculum was placed in the vagina and a single-tooth tenaculum attached to the anterior lip of the cervix.  Hulka clamp was placed.  Single-tooth tenaculum and speculum were removed and our attention was turned to the abdomen.  A 0.25% Marcaine was used to provide local anesthesia at the site of our infraumbilical and suprapubic incisions.  Scalpel was used to make an infraumbilical skin incision and this was extended bluntly to the level of the fascia using a Kelly clamp.  Optical trocar was then inserted under direct visualization.  Once intraperitoneal placement was confirmed, CO2 was turned on and the abdomen and pelvis were surveyed. The uterus appeared normal.  Small, less than 1 cm cystic lesions at the fimbriated end of bilateral fallopian tubes.  Ovaries appeared normal. She did have endometriotic implants on the left  uterosacral ligament and the left posterior cul-de-sac and the left ovarian fossa.  A scalpel was used to make a 5-mm suprapubic incision and a 5-mm trocar was inserted under direct visualization.  The uterus was tented upward and the Kleppingers were used to cauterize endometriosis implants mentioned above.  Fallopian tube cysts were then grasped and tented upwards, cauterized, and cut.  Hemostasis was noted.  Cysts were removed and passed off to be sent to pathology.  Hemostasis was noted throughout.  No other endometriosis implants were seen.  Trocars and instruments were removed from the abdomen.  A deep stitch was placed in the infraumbilical incision and the skin was closed with 3-0 Vicryl. Dermabond was placed over both incisions.  The Hulka clamp was removed. The patient was extubated and taken to the recovery room in stable condition.  Sponge, lap, needle, and instrument counts were correct x2.     Marylynn Pearson, MD     GA/MEDQ  D:  05/31/2013  T:  05/31/2013  Job:  [id number]

## 2013-06-13 ENCOUNTER — Other Ambulatory Visit: Payer: Self-pay

## 2013-06-13 DIAGNOSIS — Z1231 Encounter for screening mammogram for malignant neoplasm of breast: Secondary | ICD-10-CM

## 2013-06-26 ENCOUNTER — Ambulatory Visit: Payer: BC Managed Care – PPO

## 2013-06-28 ENCOUNTER — Other Ambulatory Visit: Payer: Self-pay

## 2013-06-28 DIAGNOSIS — Z1231 Encounter for screening mammogram for malignant neoplasm of breast: Secondary | ICD-10-CM

## 2013-07-06 ENCOUNTER — Ambulatory Visit
Admission: RE | Admit: 2013-07-06 | Discharge: 2013-07-06 | Disposition: A | Discharge status: Home / Self Care | Payer: BC Managed Care – PPO | Source: Ambulatory Visit

## 2013-07-06 DIAGNOSIS — Z1231 Encounter for screening mammogram for malignant neoplasm of breast: Secondary | ICD-10-CM

## 2013-07-31 ENCOUNTER — Ambulatory Visit
Admission: RE | Admit: 2013-07-31 | Discharge: 2013-07-31 | Disposition: A | Discharge status: Home / Self Care | Payer: BC Managed Care – PPO | Source: Ambulatory Visit | Attending: Obstetrics and Gynecology | Admitting: Obstetrics and Gynecology

## 2013-07-31 DIAGNOSIS — R922 Inconclusive mammogram: Secondary | ICD-10-CM

## 2013-07-31 MED ORDER — GADOBENATE DIMEGLUMINE 529 MG/ML IV SOLN
10.0000 mL | Freq: Once | INTRAVENOUS | Status: AC | PRN
  Administered 2013-07-31: 10 mL via INTRAVENOUS

## 2014-03-25 ENCOUNTER — Other Ambulatory Visit: Payer: Self-pay | Admitting: Obstetrics and Gynecology

## 2014-03-26 LAB — CYTOLOGY - PAP

## 2014-04-08 ENCOUNTER — Ambulatory Visit (INDEPENDENT_AMBULATORY_CARE_PROVIDER_SITE_OTHER): Payer: BC Managed Care – PPO | Admitting: Emergency Medicine

## 2014-04-08 VITALS — BP 110/62 | HR 84 | Temp 98.3°F | Resp 18 | Ht 67.0 in | Wt 131.0 lb

## 2014-04-08 DIAGNOSIS — R05 Cough: Secondary | ICD-10-CM

## 2014-04-08 DIAGNOSIS — R0981 Nasal congestion: Secondary | ICD-10-CM

## 2014-04-08 DIAGNOSIS — R059 Cough, unspecified: Secondary | ICD-10-CM

## 2014-04-08 LAB — POCT RAPID STREP A (OFFICE): Rapid Strep A Screen: NEGATIVE

## 2014-04-08 MED ORDER — AZITHROMYCIN 250 MG PO TABS: ORAL_TABLET | ORAL | Status: DC

## 2014-04-08 NOTE — Progress Notes (Signed)
Subjective:  This chart was scribed for Remo Lipps A. Everlene Farrier, MD by Molli Posey, Medical scribe. This patient was seen in ROOM 10 and the patient's care was started 12:22 PM.   Patient ID: Crystal Mcknight, female    DOB: Jan 16, 1976, 38 y.o.   MRN: [id number] Chief Complaint  Patient presents with  . Cough    x2 dark yellow mucous   . Nasal Congestion    HPI HPI Comments: Crystal Mcknight is a 38 y.o. female who presents to [facility] complaining of constant congestion for the past 2 weeks. She reports her phlegm is yellow colored. She reports associated sinus pressure, rhinorrhea and productive cough. Pt states she has been taking sudafed and Mucinex which has failed to relieve her symptoms. Pt recently traveled to [place]. She denies sore throat. She reports she is allergic to Penicillins. She states she knows she can take Z-Pak.   Past Medical History  Diagnosis Date  . Thyroid disease   . Hypothyroidism   . SVD (spontaneous vaginal delivery)     x 2  . PONV (postoperative nausea and vomiting)   . Depression   . Arthritis     hands  . Pelvic pain    Allergies  Allergen Reactions  . Penicillins Rash    When she was 38years old, had a rash when took this for strep throat   Current Outpatient Prescriptions on File Prior to Visit  Medication Sig Dispense Refill  . levothyroxine (LEVOTHROID) 175 MCG tablet Take 175 mcg by mouth daily before breakfast.    . sertraline (ZOLOFT) 25 MG tablet Take 25 mg by mouth daily.    . DULoxetine (CYMBALTA) 20 MG capsule Take 20 mg by mouth daily.    Marland Kitchen HYDROcodone-acetaminophen (NORCO/VICODIN) 5-325 MG per tablet Take 0.5 tablets by mouth every 6 (six) hours as needed for moderate pain.    Marland Kitchen ibuprofen (ADVIL,MOTRIN) 200 MG tablet Take 600 mg by mouth every 6 (six) hours as needed for mild pain, moderate pain or cramping.     No current facility-administered medications on file prior to visit.   Past Surgical History  Procedure Laterality Date    . Appendectomy    . Rhinoplasty    . Wisdom tooth extraction    . Laparoscopy N/A 05/31/2013    Procedure: LAPAROSCOPY OPERATIVE, FULGURATION OF ENDOMETRIOSIS, REMOVAL OF BILATERAL FALLOPIAN TUBE CYST;  Surgeon: Marylynn Pearson, MD;  Location: [facility];  Service: Gynecology;  Laterality: N/A;   Filed Vitals:   04/08/14 1109  BP: 110/62  Pulse: 84  Temp: 98.3 F (36.8 C)  Resp: 18     Review of Systems  HENT: Positive for congestion, rhinorrhea and sinus pressure. Negative for sore throat.   Respiratory: Positive for cough.        Objective:   Physical Exam  Constitutional: She is oriented to person, place, and time. She appears well-developed and well-nourished.  HENT:  Head: Normocephalic and atraumatic.  Nasal congestion. Tenderness over both maxillary sinuses. Throat was normal.   Neck: Normal range of motion. Neck supple. No tracheal deviation present.  Cardiovascular: Normal rate, regular rhythm and normal heart sounds.   Pulmonary/Chest: Effort normal and breath sounds normal. No respiratory distress. She has no wheezes. She has no rales.  Abdominal: She exhibits no distension.  Neurological: She is alert and oriented to person, place, and time.  Skin: Skin is warm and dry.  Psychiatric: She has a normal mood and affect. Her behavior is normal.  Nursing note  and vitals reviewed.      Results for orders placed or performed in visit on 04/08/14  POCT rapid strep A  Result Value Ref Range   Rapid Strep A Screen Negative Negative    Assessment & Plan:  Patient's physical exam is consistent with maxillary sinusitis. Will treat with Z-Pak and  saline flushes..I personally performed the services described in this documentation, which was scribed in my presence. The recorded information has been reviewed and is accurate.

## 2014-04-08 NOTE — Patient Instructions (Signed)

## 2014-12-23 ENCOUNTER — Ambulatory Visit (INDEPENDENT_AMBULATORY_CARE_PROVIDER_SITE_OTHER): Payer: BLUE CROSS/BLUE SHIELD | Admitting: Family Medicine

## 2014-12-23 ENCOUNTER — Ambulatory Visit (INDEPENDENT_AMBULATORY_CARE_PROVIDER_SITE_OTHER): Payer: BLUE CROSS/BLUE SHIELD

## 2014-12-23 VITALS — BP 112/76 | HR 90 | Temp 98.6°F | Resp 16 | Ht 67.0 in | Wt 131.8 lb

## 2014-12-23 DIAGNOSIS — M79672 Pain in left foot: Secondary | ICD-10-CM

## 2014-12-23 DIAGNOSIS — S9032XA Contusion of left foot, initial encounter: Secondary | ICD-10-CM

## 2014-12-23 MED ORDER — TRAMADOL HCL 50 MG PO TABS: 50.0000 mg | ORAL_TABLET | Freq: Three times a day (TID) | ORAL | Status: DC | PRN

## 2014-12-23 NOTE — Patient Instructions (Addendum)
I do not see any definite fracture on your foot x-ray Use the walking boot as needed for comfort- you can also use your crutches Continue to ice, elevated the foot when you can Let me know if your foot is not getting a lot better over the next 1-2 weeks- in that case we should re x-ray your foot Use the tramadol as needed for pain but remember it can make you a little sleepy

## 2014-12-23 NOTE — Progress Notes (Signed)
[facility] [address] [place] [address] [phone]  Date:  12/23/2014   Name:  Crystal Mcknight   DOB:  [birth year]   MRN:  [id number]  PCP:  Default, Provider, MD    Chief Complaint: Foot Injury   History of Present Illness:  Crystal Mcknight is a 39 y.o. very pleasant female patient who presents with the following:  Generally healthy woman here today with problem with her left foot.  She slipped while walking down some stairs this past thursday- today is Monday.  She had a little cut on the big toe which seems to be doing ok.  She has been using ice, elevation and has been staying off the foot as best as she can- she has crutches to use at home She is generally very active and healthy.  Enjoys exercise,  Married with 2 young children She had a tetanus in 2008.    She is taking 800 mg of ibuprofen every 6 hours but still has some pain  There are no active problems to display for this patient.   Past Medical History  Diagnosis Date  . Thyroid disease   . Hypothyroidism   . SVD (spontaneous vaginal delivery)     x 2  . PONV (postoperative nausea and vomiting)   . Depression   . Arthritis     hands  . Pelvic pain     Past Surgical History  Procedure Laterality Date  . Appendectomy    . Rhinoplasty    . Wisdom tooth extraction    . Laparoscopy N/A 05/31/2013    Procedure: LAPAROSCOPY OPERATIVE, FULGURATION OF ENDOMETRIOSIS, REMOVAL OF BILATERAL FALLOPIAN TUBE CYST;  Surgeon: Marylynn Pearson, MD;  Location: [facility];  Service: Gynecology;  Laterality: N/A;    History  Substance Use Topics  . Smoking status: Never Smoker   . Smokeless tobacco: Never Used  . Alcohol Use: 0.0 oz/week    7-14 Glasses of wine per week     Comment: 1-2 glasses wind daily    Family History  Problem Relation Age of Onset  . Cancer Maternal Grandmother   . Heart disease Maternal Grandfather   . Cancer Paternal Grandmother   . Heart disease Paternal Grandfather    . Cancer Mother   . Hyperlipidemia Mother   . Hyperlipidemia Father   . Hypertension Father     Allergies  Allergen Reactions  . Penicillins Rash    When she was 39years old, had a rash when took this for strep throat    Medication list has been reviewed and updated.  Current Outpatient Prescriptions on File Prior to Visit  Medication Sig Dispense Refill  . ibuprofen (ADVIL,MOTRIN) 200 MG tablet Take 600 mg by mouth every 6 (six) hours as needed for mild pain, moderate pain or cramping.    Marland Kitchen levothyroxine (LEVOTHROID) 175 MCG tablet Take 175 mcg by mouth daily before breakfast.    . Multiple Vitamin (MULTIVITAMIN WITH MINERALS) TABS tablet Take 1 tablet by mouth daily.    . sertraline (ZOLOFT) 25 MG tablet Take 25 mg by mouth daily.    Marland Kitchen azithromycin (ZITHROMAX) 250 MG tablet Take 2 tabs PO x 1 dose, then 1 tab PO QD x 4 days (Patient not taking: Reported on 12/23/2014) 6 tablet 0  . DULoxetine (CYMBALTA) 20 MG capsule Take 20 mg by mouth daily.    Marland Kitchen HYDROcodone-acetaminophen (NORCO/VICODIN) 5-325 MG per tablet Take 0.5 tablets by mouth every 6 (six) hours as needed  for moderate pain.     No current facility-administered medications on file prior to visit.    Review of Systems:  As per HPI- otherwise negative.   Physical Examination: Filed Vitals:   12/23/14 1255  BP: 112/76  Pulse: 90  Temp: 98.6 F (37 C)  Resp: 16   Filed Vitals:   12/23/14 1255  Height: 5\' 7"  (1.702 m)  Weight: 131 lb 12.8 oz (59.784 kg)   Body mass index is 20.64 kg/(m^2). Ideal Body Weight: Weight in (lb) to have BMI = 25: 159.3   GEN: WDWN, NAD, Non-toxic, Alert & Oriented x 3, fit build HEENT: Atraumatic, Normocephalic.  Ears and Nose: No external deformity. EXTR: No clubbing/cyanosis/edema NEURO: favoring left slightly PSYCH: Normally interactive. Conversant. Not depressed or anxious appearing.  Calm demeanor.  Left foot: she has tenderness and bruising over the distal half of the 1st/  2nd/3rd MT.  Small abrasion over dorsum of great toe.  Achilles and ankle negative,  Tibia negative  [facility] reading (PRIMARY) by  Dr. Lorelei Pont. Left foot: possible fracture of the cuboid, overread as negative  LEFT FOOT - COMPLETE 3+ VIEW  COMPARISON: None.  FINDINGS: There is no evidence of fracture or dislocation. There is no evidence of arthropathy or other focal bone abnormality. Soft tissues are unremarkable.  IMPRESSION: Negative.  Assessment and Plan: Pain in left foot - Plan: DG Foot Complete Left  Contusion of left foot, initial encounter - Plan: traMADol (ULTRAM) 50 MG tablet  No definite fracture.  Placed in a short CAM for comfort, she has crutches at home.  Plan for close follow-up if not better soon  Signed Lamar Blinks, MD

## 2015-05-26 ENCOUNTER — Telehealth: Payer: Self-pay

## 2015-05-26 MED ORDER — LEVOTHYROXINE SODIUM 175 MCG PO TABS: 175.0000 ug | ORAL_TABLET | Freq: Every day | ORAL | Status: DC

## 2015-05-26 NOTE — Telephone Encounter (Signed)
Called in a month supply for pt- called and LMOM for her that this was done.  If her current dose is not 175 please let me know

## 2015-05-26 NOTE — Telephone Encounter (Signed)
Can we do this for pt?

## 2015-05-26 NOTE — Telephone Encounter (Signed)
Pt is out of town and needs [place] to call in a few Levothyroxine pills  Until she gets back,her obgyn usually prescribes but that office is closed for the holidays,  Best phone for pt is [phone]  Pharmacy in [facility]  Phone number [phone]

## 2015-06-23 ENCOUNTER — Ambulatory Visit (INDEPENDENT_AMBULATORY_CARE_PROVIDER_SITE_OTHER): Payer: BLUE CROSS/BLUE SHIELD | Admitting: Family Medicine

## 2015-06-23 ENCOUNTER — Encounter: Payer: Self-pay | Admitting: Family Medicine

## 2015-06-23 VITALS — BP 132/83 | HR 105 | Temp 98.0°F | Resp 16 | Ht 67.5 in | Wt 137.0 lb

## 2015-06-23 DIAGNOSIS — R05 Cough: Secondary | ICD-10-CM | POA: Diagnosis not present

## 2015-06-23 DIAGNOSIS — J22 Unspecified acute lower respiratory infection: Secondary | ICD-10-CM

## 2015-06-23 DIAGNOSIS — R059 Cough, unspecified: Secondary | ICD-10-CM

## 2015-06-23 DIAGNOSIS — J988 Other specified respiratory disorders: Secondary | ICD-10-CM

## 2015-06-23 MED ORDER — AZITHROMYCIN 250 MG PO TABS: ORAL_TABLET | ORAL | Status: DC

## 2015-06-23 MED ORDER — BENZONATATE 100 MG PO CAPS: 100.0000 mg | ORAL_CAPSULE | Freq: Three times a day (TID) | ORAL | Status: DC | PRN

## 2015-06-23 MED ORDER — HYDROCODONE-HOMATROPINE 5-1.5 MG/5ML PO SYRP: ORAL_SOLUTION | ORAL | Status: DC

## 2015-06-23 NOTE — Patient Instructions (Signed)
Saline nasal spray atleast 4 times per day for nasal congestion, over the counter mucinex or mucinex DM for cough during the day.  Tessalon if needed during day, then hydrocodone cough syrup at night if needed.Drink plenty of fluids, and rest as needed. Return to the clinic or go to the nearest emergency room if any of your symptoms worsen or new symptoms occur.   Acute Bronchitis Bronchitis is inflammation of the airways that extend from the windpipe into the lungs (bronchi). The inflammation often causes mucus to develop. This leads to a cough, which is the most common symptom of bronchitis.  In acute bronchitis, the condition usually develops suddenly and goes away over time, usually in a couple weeks. Smoking, allergies, and asthma can make bronchitis worse. Repeated episodes of bronchitis may cause further lung problems.  CAUSES Acute bronchitis is most often caused by the same virus that causes a cold. The virus can spread from person to person (contagious) through coughing, sneezing, and touching contaminated objects. SIGNS AND SYMPTOMS   Cough.   Fever.   Coughing up mucus.   Body aches.   Chest congestion.   Chills.   Shortness of breath.   Sore throat.  DIAGNOSIS  Acute bronchitis is usually diagnosed through a physical exam. Your health care provider will also ask you questions about your medical history. Tests, such as chest X-rays, are sometimes done to rule out other conditions.  TREATMENT  Acute bronchitis usually goes away in a couple weeks. Oftentimes, no medical treatment is necessary. Medicines are sometimes given for relief of fever or cough. Antibiotic medicines are usually not needed but may be prescribed in certain situations. In some cases, an inhaler may be recommended to help reduce shortness of breath and control the cough. A cool mist vaporizer may also be used to help thin bronchial secretions and make it easier to clear the chest.  HOME CARE  INSTRUCTIONS  Get plenty of rest.   Drink enough fluids to keep your urine clear or pale yellow (unless you have a medical condition that requires fluid restriction). Increasing fluids may help thin your respiratory secretions (sputum) and reduce chest congestion, and it will prevent dehydration.   Take medicines only as directed by your health care provider.  If you were prescribed an antibiotic medicine, finish it all even if you start to feel better.  Avoid smoking and secondhand smoke. Exposure to cigarette smoke or irritating chemicals will make bronchitis worse. If you are a smoker, consider using nicotine gum or skin patches to help control withdrawal symptoms. Quitting smoking will help your lungs heal faster.   Reduce the chances of another bout of acute bronchitis by washing your hands frequently, avoiding people with cold symptoms, and trying not to touch your hands to your mouth, nose, or eyes.   Keep all follow-up visits as directed by your health care provider.  SEEK MEDICAL CARE IF: Your symptoms do not improve after 1 week of treatment.  SEEK IMMEDIATE MEDICAL CARE IF:  You develop an increased fever or chills.   You have chest pain.   You have severe shortness of breath.  You have bloody sputum.   You develop dehydration.  You faint or repeatedly feel like you are going to pass out.  You develop repeated vomiting.  You develop a severe headache. MAKE SURE YOU:   Understand these instructions.  Will watch your condition.  Will get help right away if you are not doing well or get worse.  This information is not intended to replace advice given to you by your health care provider. Make sure you discuss any questions you have with your health care provider.   Document Released: 06/17/2004 Document Revised: 05/31/2014 Document Reviewed: 10/31/2012 Elsevier Interactive Patient Education [employer].

## 2015-06-23 NOTE — Progress Notes (Signed)
Subjective:  By signing my name below, I, Moises Blood, attest that this documentation has been prepared under the direction and in the presence of Merri Ray, MD. Electronically Signed: Moises Blood, [place]. 06/23/2015 , 11:51 AM .  Patient was seen in Room 27 .   Patient ID: Crystal Mcknight, female    DOB: [birth year], 40 y.o.   MRN: [id number] Chief Complaint  Patient presents with  . Cough    x 3 weeks   HPI Crystal Mcknight is a 40 y.o. female  Pt is here for a cough that's present for past 3 weeks.  She was in [place] visiting family for the holidays. Her mother had a bad cough and was put on breathing treatments.   When she returned from [place], she noticed having some sinus congestion and chest congestion. She also has some productive coughs (yellow phlegm) and feels fatigue. She also notes shortness of breath with exertion. She's taken ibuprofen, sudafed and robitussin without any relief. Her coughs keeps her up at night. She took zpak for sinusitis in November. She denies heart complications. She denies asthma or lung problems.   She works from home.   Patient Active Problem List   Diagnosis Date Noted  . 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 Degenerative arthritis of finger 09/28/2012   Past Medical History  Diagnosis Date  . Thyroid disease   . Hypothyroidism   . SVD (spontaneous vaginal delivery)     x 2  . PONV (postoperative nausea and vomiting)   . Depression   . Arthritis     hands  . Pelvic pain    Past Surgical History  Procedure Laterality Date  . Appendectomy    . Rhinoplasty    . Wisdom tooth extraction    . Laparoscopy N/A 05/31/2013    Procedure: LAPAROSCOPY OPERATIVE, FULGURATION OF ENDOMETRIOSIS, REMOVAL OF BILATERAL FALLOPIAN TUBE CYST;  Surgeon: Marylynn Pearson, MD;  Location: [facility];  Service: Gynecology;  Laterality: N/A;   Allergies  Allergen Reactions  . Penicillins Rash    When she was 40years old, had a rash when took this for strep  throat   Prior to Admission medications   Medication Sig Start Date End Date Taking? Authorizing Provider  fluticasone (FLONASE) 50 MCG/ACT nasal spray Place into both nostrils daily.    Historical Provider, MD  ibuprofen (ADVIL,MOTRIN) 200 MG tablet Take 600 mg by mouth every 6 (six) hours as needed for mild pain, moderate pain or cramping.    Historical Provider, MD  levothyroxine (LEVOTHROID) 175 MCG tablet Take 1 tablet (175 mcg total) by mouth daily before breakfast. 05/26/15   Darreld Mclean, MD  Multiple Vitamin (MULTIVITAMIN WITH MINERALS) TABS tablet Take 1 tablet by mouth daily.    Historical Provider, MD  sertraline (ZOLOFT) 25 MG tablet Take 25 mg by mouth daily.    Historical Provider, MD  traMADol (ULTRAM) 50 MG tablet Take 1 tablet (50 mg total) by mouth every 8 (eight) hours as needed. 12/23/14   Darreld Mclean, MD   Social History   Social History  . Marital Status: Married    Spouse Name: N/A  . Number of Children: N/A  . Years of Education: N/A   Occupational History  . Not on file.   Social History Main Topics  . Smoking status: Never Smoker   . Smokeless tobacco: Never Used  . Alcohol Use: 0.0 oz/week    7-14 Glasses of wine per week     Comment: 1-2 glasses wind  daily  . Drug Use: No  . Sexual Activity: Yes    Birth Control/ Protection: None     Comment: husband vasectomy   Other Topics Concern  . Not on file   Social History Narrative   Review of Systems  Constitutional: Positive for fatigue. Negative for fever and chills.  HENT: Positive for congestion and sinus pressure. Negative for rhinorrhea and sore throat.   Respiratory: Positive for cough and shortness of breath. Negative for chest tightness and wheezing.   Cardiovascular: Negative for chest pain.  Gastrointestinal: Negative for nausea and vomiting.       Objective:   Physical Exam  Constitutional: She is oriented to person, place, and time. She appears well-developed and  well-nourished. No distress.  HENT:  Head: Normocephalic and atraumatic.  Right Ear: Hearing, tympanic membrane, external ear and ear canal normal.  Left Ear: Hearing, tympanic membrane, external ear and ear canal normal.  Nose: Right sinus exhibits maxillary sinus tenderness. Right sinus exhibits no frontal sinus tenderness. Left sinus exhibits maxillary sinus tenderness. Left sinus exhibits no frontal sinus tenderness.  Mouth/Throat: Oropharynx is clear and moist and mucous membranes are normal. Mucous membranes are not dry. No oropharyngeal exudate.  Eyes: Conjunctivae and EOM are normal. Pupils are equal, round, and reactive to light.  Cardiovascular: Normal rate, regular rhythm, normal heart sounds and intact distal pulses.   No murmur heard. Pulmonary/Chest: Effort normal and breath sounds normal. No respiratory distress. She has no wheezes. She has no rhonchi. She has no rales.  Lymphadenopathy:    She has no cervical adenopathy.  Neurological: She is alert and oriented to person, place, and time.  Skin: Skin is warm and dry. No rash noted.  Psychiatric: She has a normal mood and affect. Her behavior is normal.  Vitals reviewed.   Filed Vitals:   06/23/15 1114  BP: 132/83  Pulse: 105  Temp: 98 F (36.7 C)  Resp: 16  Height: 5' 7.5" (1.715 m)  Weight: 137 lb (62.143 kg)  SpO2: 98%      Assessment & Plan:   Crystal Mcknight is a 40 y.o. female Cough - Plan: azithromycin (ZITHROMAX) 250 MG tablet, benzonatate (TESSALON) 100 MG capsule, HYDROcodone-homatropine (HYCODAN) 5-1.5 MG/5ML syrup  LRTI (lower respiratory tract infection) - Plan: azithromycin (ZITHROMAX) 250 MG tablet, benzonatate (TESSALON) 100 MG capsule, HYDROcodone-homatropine (HYCODAN) 5-1.5 MG/5ML syrup   suspected bronchitis, with persistent cough for the past 3 weeks. Reassuring exam and vital signs, so x-ray and blood work were deferred.  Atypicals possible.   -Start azithromycin , Tessalon Perles and  Hycodan syrup given for cough.  Side effects discussed, and RTC precautions.   Meds ordered this encounter  Medications  . azithromycin (ZITHROMAX) 250 MG tablet    Sig: Take 2 pills by mouth on day 1, then 1 pill by mouth per day on days 2 through 5.    Dispense:  6 tablet    Refill:  0  . benzonatate (TESSALON) 100 MG capsule    Sig: Take 1 capsule (100 mg total) by mouth 3 (three) times daily as needed for cough.    Dispense:  20 capsule    Refill:  0  . HYDROcodone-homatropine (HYCODAN) 5-1.5 MG/5ML syrup    Sig: 37m by mouth a bedtime as needed for cough.    Dispense:  120 mL    Refill:  0   Patient Instructions  Saline nasal spray atleast 4 times per day for nasal congestion, over the counter mucinex or  mucinex DM for cough during the day.  Tessalon if needed during day, then hydrocodone cough syrup at night if needed.Drink plenty of fluids, and rest as needed. Return to the clinic or go to the nearest emergency room if any of your symptoms worsen or new symptoms occur.   Acute Bronchitis Bronchitis is inflammation of the airways that extend from the windpipe into the lungs (bronchi). The inflammation often causes mucus to develop. This leads to a cough, which is the most common symptom of bronchitis.  In acute bronchitis, the condition usually develops suddenly and goes away over time, usually in a couple weeks. Smoking, allergies, and asthma can make bronchitis worse. Repeated episodes of bronchitis may cause further lung problems.  CAUSES Acute bronchitis is most often caused by the same virus that causes a cold. The virus can spread from person to person (contagious) through coughing, sneezing, and touching contaminated objects. SIGNS AND SYMPTOMS   Cough.   Fever.   Coughing up mucus.   Body aches.   Chest congestion.   Chills.   Shortness of breath.   Sore throat.  DIAGNOSIS  Acute bronchitis is usually diagnosed through a physical exam. Your health  care provider will also ask you questions about your medical history. Tests, such as chest X-rays, are sometimes done to rule out other conditions.  TREATMENT  Acute bronchitis usually goes away in a couple weeks. Oftentimes, no medical treatment is necessary. Medicines are sometimes given for relief of fever or cough. Antibiotic medicines are usually not needed but may be prescribed in certain situations. In some cases, an inhaler may be recommended to help reduce shortness of breath and control the cough. A cool mist vaporizer may also be used to help thin bronchial secretions and make it easier to clear the chest.  HOME CARE INSTRUCTIONS  Get plenty of rest.   Drink enough fluids to keep your urine clear or pale yellow (unless you have a medical condition that requires fluid restriction). Increasing fluids may help thin your respiratory secretions (sputum) and reduce chest congestion, and it will prevent dehydration.   Take medicines only as directed by your health care provider.  If you were prescribed an antibiotic medicine, finish it all even if you start to feel better.  Avoid smoking and secondhand smoke. Exposure to cigarette smoke or irritating chemicals will make bronchitis worse. If you are a smoker, consider using nicotine gum or skin patches to help control withdrawal symptoms. Quitting smoking will help your lungs heal faster.   Reduce the chances of another bout of acute bronchitis by washing your hands frequently, avoiding people with cold symptoms, and trying not to touch your hands to your mouth, nose, or eyes.   Keep all follow-up visits as directed by your health care provider.  SEEK MEDICAL CARE IF: Your symptoms do not improve after 1 week of treatment.  SEEK IMMEDIATE MEDICAL CARE IF:  You develop an increased fever or chills.   You have chest pain.   You have severe shortness of breath.  You have bloody sputum.   You develop dehydration.  You faint or  repeatedly feel like you are going to pass out.  You develop repeated vomiting.  You develop a severe headache. MAKE SURE YOU:   Understand these instructions.  Will watch your condition.  Will get help right away if you are not doing well or get worse.   This information is not intended to replace advice given to you by your health  care provider. Make sure you discuss any questions you have with your health care provider.   Document Released: 06/17/2004 Document Revised: 05/31/2014 Document Reviewed: 10/31/2012 Elsevier Interactive Patient Education [employer].        I personally performed the services described in this documentation, which was scribed in my presence. The recorded information has been reviewed and considered, and addended by me as needed.

## 2015-07-03 ENCOUNTER — Ambulatory Visit (INDEPENDENT_AMBULATORY_CARE_PROVIDER_SITE_OTHER): Payer: BLUE CROSS/BLUE SHIELD

## 2015-07-03 ENCOUNTER — Ambulatory Visit (INDEPENDENT_AMBULATORY_CARE_PROVIDER_SITE_OTHER): Payer: BLUE CROSS/BLUE SHIELD | Admitting: Urgent Care

## 2015-07-03 VITALS — BP 120/76 | HR 99 | Temp 98.1°F | Resp 16 | Ht 67.5 in | Wt 137.0 lb

## 2015-07-03 DIAGNOSIS — M545 Low back pain: Secondary | ICD-10-CM

## 2015-07-03 DIAGNOSIS — J988 Other specified respiratory disorders: Secondary | ICD-10-CM | POA: Diagnosis not present

## 2015-07-03 DIAGNOSIS — R0781 Pleurodynia: Secondary | ICD-10-CM

## 2015-07-03 DIAGNOSIS — R05 Cough: Secondary | ICD-10-CM

## 2015-07-03 DIAGNOSIS — R059 Cough, unspecified: Secondary | ICD-10-CM

## 2015-07-03 DIAGNOSIS — J22 Unspecified acute lower respiratory infection: Secondary | ICD-10-CM

## 2015-07-03 LAB — POCT CBC
GRANULOCYTE PERCENT: 57.4 % (ref 37–80)
HEMATOCRIT: 40.5 % (ref 37.7–47.9)
HEMOGLOBIN: 14.1 g/dL (ref 12.2–16.2)
Lymph, poc: 1.8 (ref 0.6–3.4)
MCH, POC: 34.5 pg — AB (ref 27–31.2)
MCHC: 34.7 g/dL (ref 31.8–35.4)
MCV: 99.4 fL — AB (ref 80–97)
MID (cbc): 0.6 (ref 0–0.9)
MPV: 7.3 fL (ref 0–99.8)
POC GRANULOCYTE: 3.2 (ref 2–6.9)
POC LYMPH PERCENT: 32.7 %L (ref 10–50)
POC MID %: 9.9 % (ref 0–12)
Platelet Count, POC: 183 10*3/uL (ref 142–424)
RBC: 4.08 M/uL (ref 4.04–5.48)
RDW, POC: 12.8 %
WBC: 5.6 10*3/uL (ref 4.6–10.2)

## 2015-07-03 MED ORDER — PREDNISONE 20 MG PO TABS: ORAL_TABLET | ORAL | Status: DC

## 2015-07-03 MED ORDER — HYDROCODONE-HOMATROPINE 5-1.5 MG/5ML PO SYRP: ORAL_SOLUTION | ORAL | Status: DC

## 2015-07-03 MED ORDER — ALBUTEROL SULFATE (2.5 MG/3ML) 0.083% IN NEBU
2.5000 mg | INHALATION_SOLUTION | Freq: Once | RESPIRATORY_TRACT | Status: AC
  Administered 2015-07-03: 2.5 mg via RESPIRATORY_TRACT

## 2015-07-03 MED ORDER — CYCLOBENZAPRINE HCL 5 MG PO TABS: 5.0000 mg | ORAL_TABLET | Freq: Every day | ORAL | Status: DC

## 2015-07-03 MED ORDER — ALBUTEROL SULFATE HFA 108 (90 BASE) MCG/ACT IN AERS: 2.0000 | INHALATION_SPRAY | Freq: Four times a day (QID) | RESPIRATORY_TRACT | Status: DC | PRN

## 2015-07-03 MED ORDER — IPRATROPIUM BROMIDE 0.02 % IN SOLN
0.5000 mg | Freq: Once | RESPIRATORY_TRACT | Status: AC
  Administered 2015-07-03: 0.5 mg via RESPIRATORY_TRACT

## 2015-07-03 NOTE — Progress Notes (Signed)
MRN: [id number] DOB: 10/15/1975  Subjective:   Crystal Mcknight is a 40 y.o. female presenting for chief complaint of Cough and Back Pain  Reports 4 week history of cough, elicits chest pain, had a really bad coughing fit that has now caused her severe back pain. Has been seen on 06/23/2015, started on Azithromycin which made no difference. She has been using Mucinex regularly as well. Denies fever, shob, belly pain, throat pain, headache, congestion, weight loss, night sweats. Denies history of asthma. Denies family history of lung disease, lung cancer. Denies smoking cigarettes.  Crystal Mcknight has a current medication list which includes the following prescription(s): benzonatate, fluticasone, hydrocodone-homatropine, levothyroxine, multivitamin with minerals, sertraline, and azithromycin. Also is allergic to penicillins.  Crystal Mcknight  has a past medical history of Thyroid disease; Hypothyroidism; SVD (spontaneous vaginal delivery); PONV (postoperative nausea and vomiting); Depression; Arthritis; and Pelvic pain. Also  has past surgical history that includes Appendectomy; Rhinoplasty; Wisdom tooth extraction; and laparoscopy (N/A, 05/31/2013).  Objective:   Vitals: BP 120/76 mmHg  Pulse 99  Temp(Src) 98.1 F (36.7 C) (Oral)  Resp 16  Ht 5' 7.5" (1.715 m)  Wt 137 lb (62.143 kg)  BMI 21.13 kg/m2  SpO2 98%  LMP 06/28/2015  Physical Exam  Constitutional: She is oriented to person, place, and time. She appears well-developed and well-nourished.  HENT:  Mouth/Throat: Oropharynx is clear and moist.  TM's intact bilaterally, no effusions or erythema. Nasal turbinates slightly erythema, nasal passages patent. No sinus tenderness. Oropharynx with post-nasal drainage but no exudates.  Eyes: Right eye exhibits no discharge. Left eye exhibits no discharge. No scleral icterus.  Neck: Normal range of motion. Neck supple.  Cardiovascular: Normal rate, regular rhythm and intact distal pulses.  Exam  reveals no gallop and no friction rub.   No murmur heard. Pulmonary/Chest: No respiratory distress. She has no wheezes. She has no rales.  Lymphadenopathy:    She has no cervical adenopathy.  Neurological: She is alert and oriented to person, place, and time.  Skin: Skin is warm and dry.   Results for orders placed or performed in visit on 07/03/15 (from the past 24 hour(s))  POCT CBC     Status: Abnormal   Collection Time: 07/03/15  2:43 PM  Result Value Ref Range   WBC 5.6 4.6 - 10.2 K/uL   Lymph, poc 1.8 0.6 - 3.4   POC LYMPH PERCENT 32.7 10 - 50 %L   MID (cbc) 0.6 0 - 0.9   POC MID % 9.9 0 - 12 %M   POC Granulocyte 3.2 2 - 6.9   Granulocyte percent 57.4 37 - 80 %G   RBC 4.08 4.04 - 5.48 M/uL   Hemoglobin 14.1 12.2 - 16.2 g/dL   HCT, POC 40.5 37.7 - 47.9 %   MCV 99.4 (A) 80 - 97 fL   MCH, POC 34.5 (A) 27 - 31.2 pg   MCHC 34.7 31.8 - 35.4 g/dL   RDW, POC 12.8 %   Platelet Count, POC 183 142 - 424 K/uL   MPV 7.3 0 - 99.8 fL   Assessment and Plan :   1. Cough 2. Pleuritic pain 3. LRTI (lower respiratory tract infection) - Patient had significant improvement with neb treatment. She will start low dose steroid course with scheduled albuterol inhaler. Continue Hycodan at night for rest. RTC in 1 week if no improvement.  4. Midline low back pain, with sciatica presence unspecified - Patient started having back pain from coughing fits. She agreed  to treatment as above. Start Flexeril at night for muscle relaxant properties.   Jaynee Eagles, PA-C [facility] [phone] 07/03/2015 1:56 PM

## 2015-07-03 NOTE — Patient Instructions (Addendum)
Because you received an x-ray today, you will receive an invoice from [facility]. Please contact [facility] at [phone] with questions or concerns regarding your invoice. Our billing staff will not be able to assist you with those questions.   Cough, Adult Coughing is a reflex that clears your throat and your airways. Coughing helps to heal and protect your lungs. It is normal to cough occasionally, but a cough that happens with other symptoms or lasts a long time may be a sign of a condition that needs treatment. A cough may last only 2-3 weeks (acute), or it may last longer than 8 weeks (chronic). CAUSES Coughing is commonly caused by:  Breathing in substances that irritate your lungs.  A viral or bacterial respiratory infection.  Allergies.  Asthma.  Postnasal drip.  Smoking.  Acid backing up from the stomach into the esophagus (gastroesophageal reflux).  Certain medicines.  Chronic lung problems, including COPD (or rarely, lung cancer).  Other medical conditions such as heart failure. HOME CARE INSTRUCTIONS  Pay attention to any changes in your symptoms. Take these actions to help with your discomfort:  Take medicines only as told by your health care provider.  If you were prescribed an antibiotic medicine, take it as told by your health care provider. Do not stop taking the antibiotic even if you start to feel better.  Talk with your health care provider before you take a cough suppressant medicine.  Drink enough fluid to keep your urine clear or pale yellow.  If the air is dry, use a cold steam vaporizer or humidifier in your bedroom or your home to help loosen secretions.  Avoid anything that causes you to cough at work or at home.  If your cough is worse at night, try sleeping in a semi-upright position.  Avoid cigarette smoke. If you smoke, quit smoking. If you need help quitting, ask your health care provider.  Avoid caffeine.  Avoid  alcohol.  Rest as needed. SEEK MEDICAL CARE IF:   You have new symptoms.  You cough up pus.  Your cough does not get better after 2-3 weeks, or your cough gets worse.  You cannot control your cough with suppressant medicines and you are losing sleep.  You develop pain that is getting worse or pain that is not controlled with pain medicines.  You have a fever.  You have unexplained weight loss.  You have night sweats. SEEK IMMEDIATE MEDICAL CARE IF:  You cough up blood.  You have difficulty breathing.  Your heartbeat is very fast.   This information is not intended to replace advice given to you by your health care provider. Make sure you discuss any questions you have with your health care provider.   Document Released: 11/06/2010 Document Revised: 01/29/2015 Document Reviewed: 07/17/2014 Elsevier Interactive Patient Education [employer].

## 2015-07-20 IMAGING — MG MM SCREEN MAMMOGRAM BILATERAL
4 series · 4 of 4 positions shown · non-contrast
Comparison: Previous exam(s).

CLINICAL DATA: Screening.

EXAM:
DIGITAL SCREENING BILATERAL MAMMOGRAM WITH CAD

[R CC]
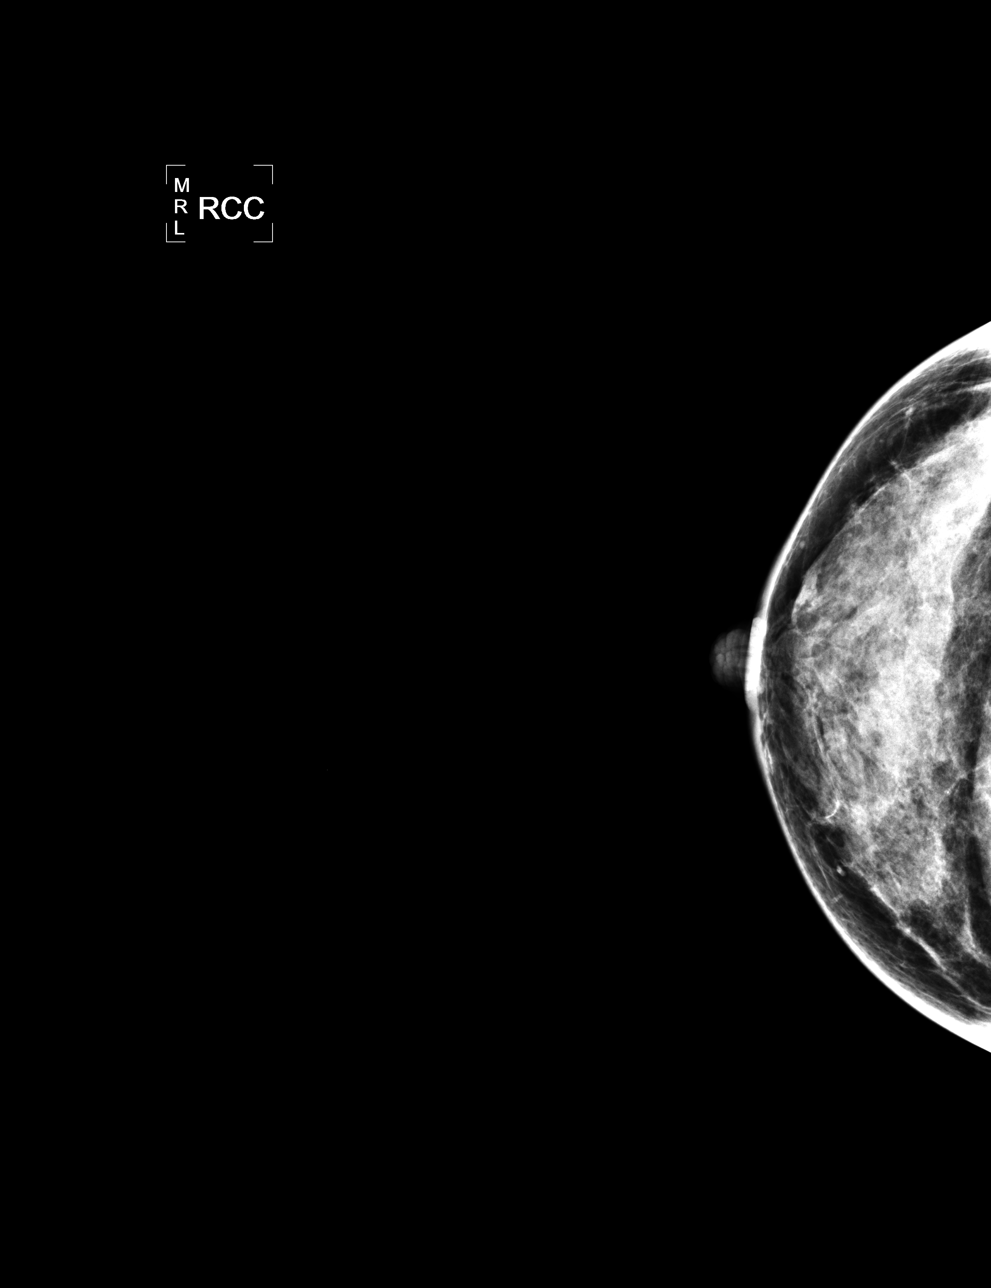

[L CC]
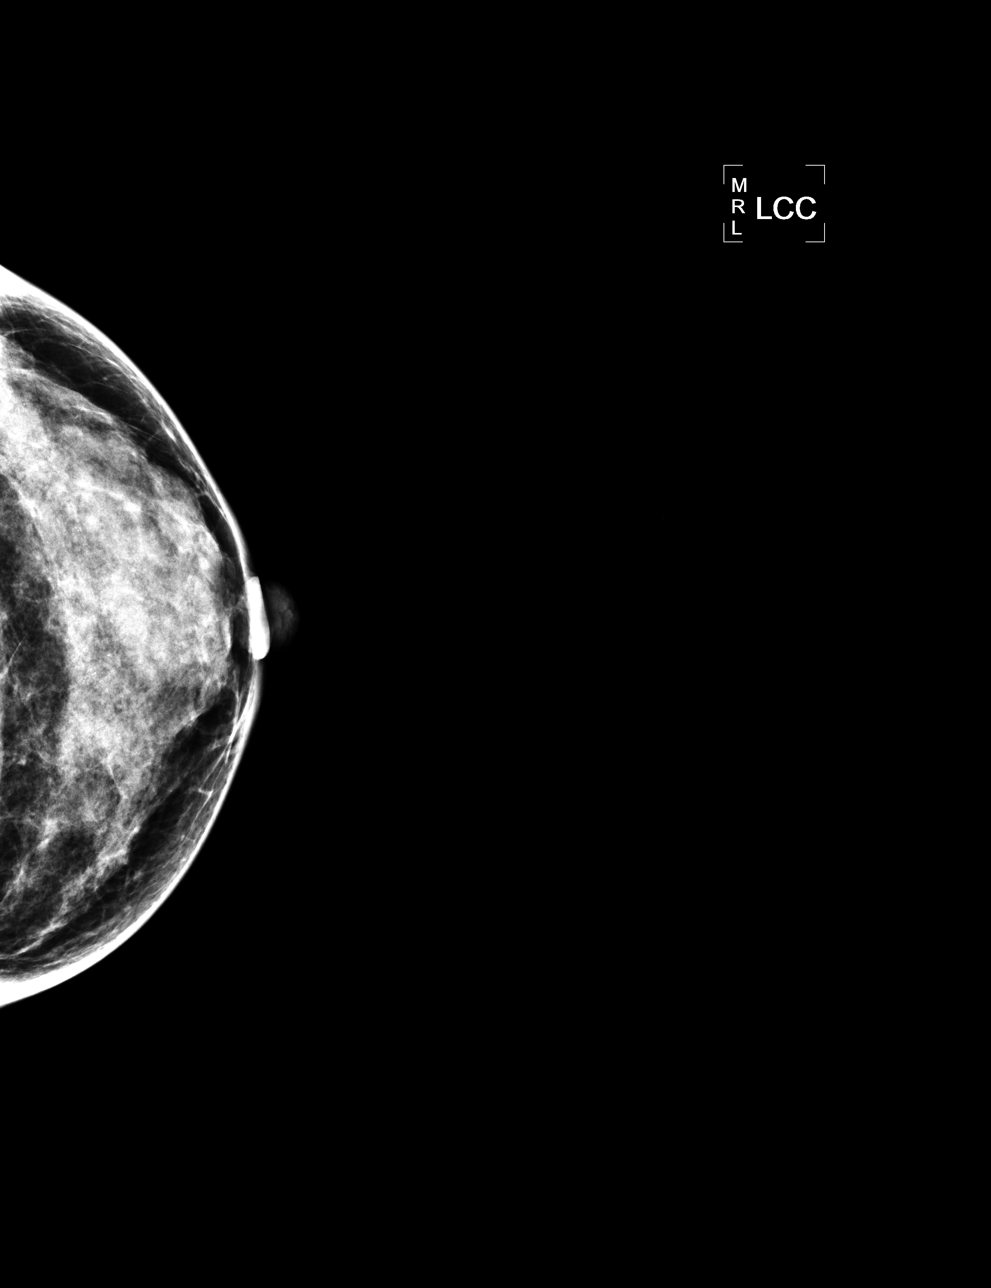

[L MLO]
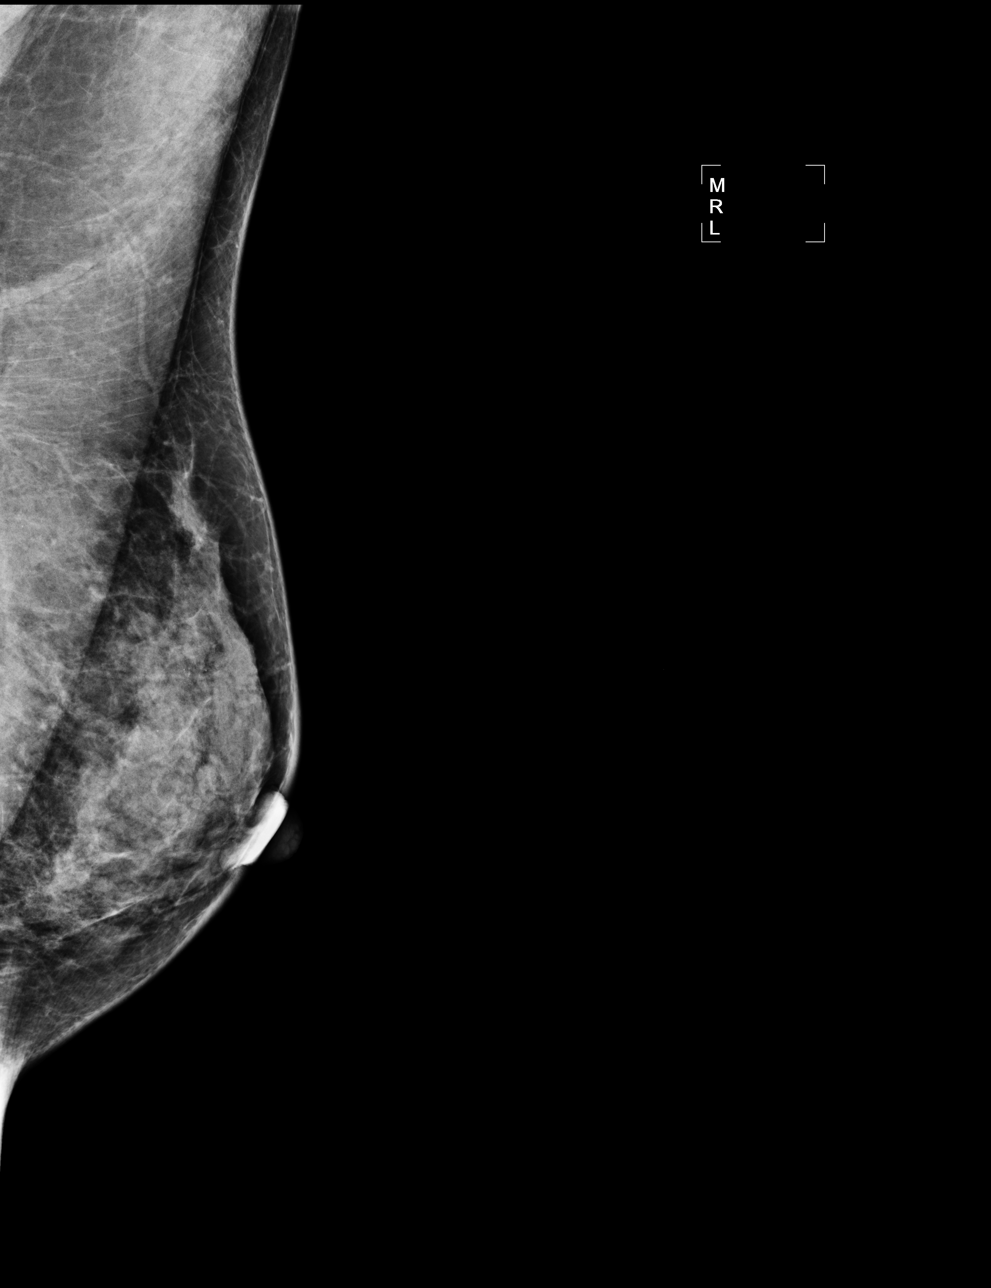

[R MLO]
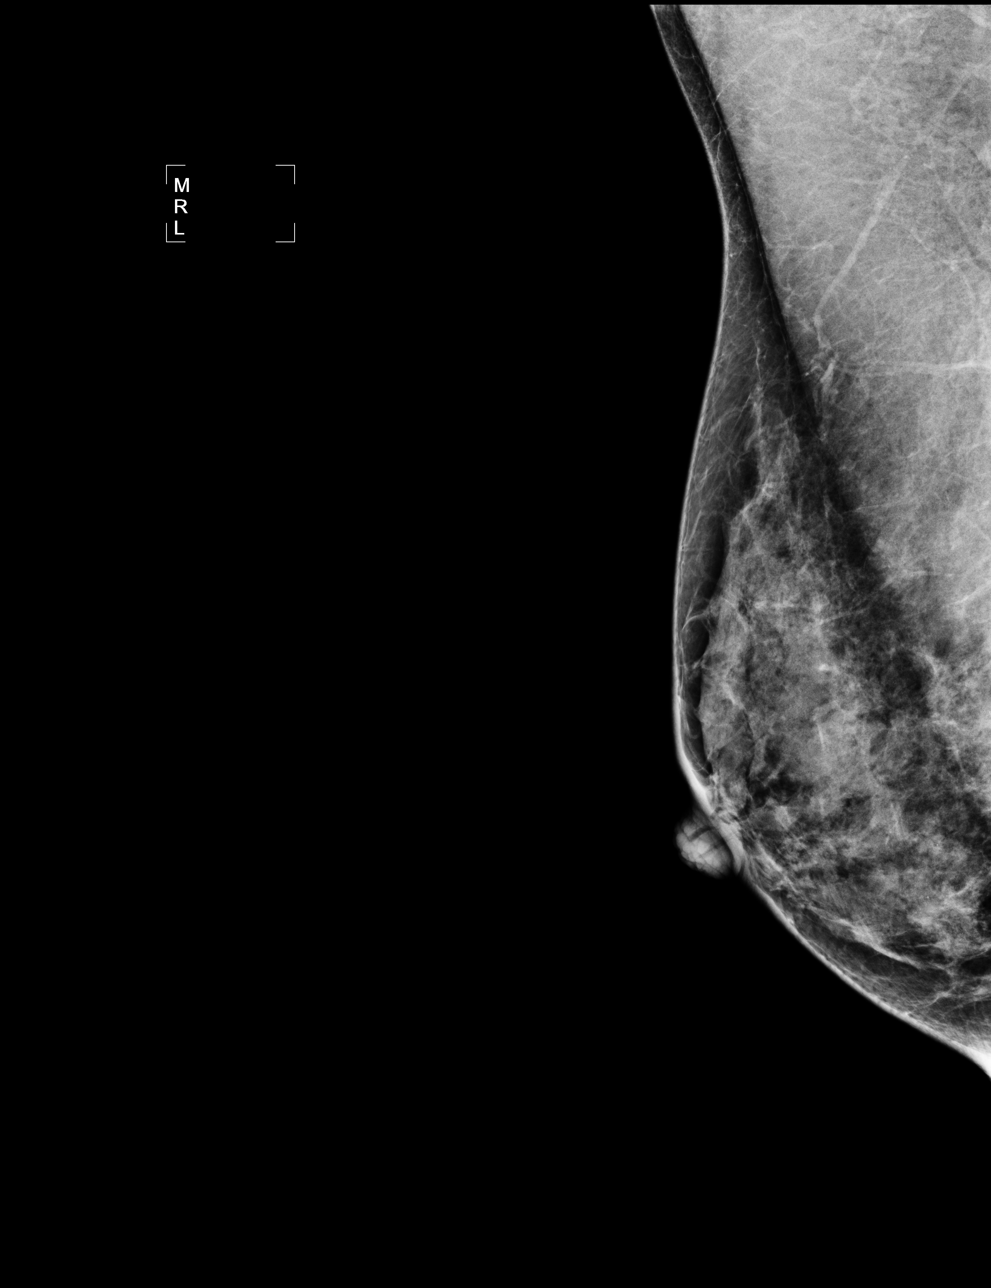

[4 of 4 positions shown; findings below may reference images not displayed]

ACR Breast Density Category d: The breast tissue is extremely dense,
which lowers the sensitivity of mammography.
FINDINGS: There are no findings suspicious for malignancy. Images were
processed with CAD.
IMPRESSION: No mammographic evidence of malignancy. A result letter of this
screening mammogram will be mailed directly to the patient.

RECOMMENDATION:
Screening mammogram in one year. (Code:[id number])

BI-RADS CATEGORY  1: Negative.

## 2016-03-18 ENCOUNTER — Ambulatory Visit (INDEPENDENT_AMBULATORY_CARE_PROVIDER_SITE_OTHER): Payer: BLUE CROSS/BLUE SHIELD | Admitting: Physician Assistant

## 2016-03-18 VITALS — BP 126/90 | HR 81 | Temp 98.4°F | Resp 17 | Ht 67.5 in | Wt 139.0 lb

## 2016-03-18 DIAGNOSIS — D696 Thrombocytopenia, unspecified: Secondary | ICD-10-CM

## 2016-03-18 DIAGNOSIS — R9431 Abnormal electrocardiogram [ECG] [EKG]: Secondary | ICD-10-CM | POA: Diagnosis not present

## 2016-03-18 DIAGNOSIS — D751 Secondary polycythemia: Secondary | ICD-10-CM | POA: Diagnosis not present

## 2016-03-18 DIAGNOSIS — R718 Other abnormality of red blood cells: Secondary | ICD-10-CM | POA: Diagnosis not present

## 2016-03-18 DIAGNOSIS — R42 Dizziness and giddiness: Secondary | ICD-10-CM | POA: Diagnosis not present

## 2016-03-18 LAB — POCT CBC
Granulocyte percent: 60.1 %G (ref 37–80)
HEMATOCRIT: 52.5 % — AB (ref 37.7–47.9)
Hemoglobin: 18.9 g/dL — AB (ref 12.2–16.2)
LYMPH, POC: 1.6 (ref 0.6–3.4)
MCH, POC: 34.7 pg — AB (ref 27–31.2)
MCHC: 36.1 g/dL — AB (ref 31.8–35.4)
MCV: 96.3 fL (ref 80–97)
MID (CBC): 0.4 (ref 0–0.9)
MPV: 7.7 fL (ref 0–99.8)
POC GRANULOCYTE: 3.1 (ref 2–6.9)
POC LYMPH %: 31.7 % (ref 10–50)
POC MID %: 8.2 % (ref 0–12)
Platelet Count, POC: 71 10*3/uL — AB (ref 142–424)
RBC: 5.45 M/uL (ref 4.04–5.48)
RDW, POC: 12.6 %
WBC: 5.1 10*3/uL (ref 4.6–10.2)

## 2016-03-18 LAB — COMPLETE METABOLIC PANEL WITH GFR
ALT: 26 U/L (ref 6–29)
AST: 47 U/L — ABNORMAL HIGH (ref 10–30)
Albumin: 4.4 g/dL (ref 3.6–5.1)
Alkaline Phosphatase: 56 U/L (ref 33–115)
BUN: 14 mg/dL (ref 7–25)
CALCIUM: 9.7 mg/dL (ref 8.6–10.2)
CHLORIDE: 101 mmol/L (ref 98–110)
CO2: 20 mmol/L (ref 20–31)
Creat: 0.81 mg/dL (ref 0.50–1.10)
GFR, Est African American: >89 mL/min (ref >=60)
Glucose, Bld: 97 mg/dL (ref 65–99)
POTASSIUM: 4.1 mmol/L (ref 3.5–5.3)
Sodium: 136 mmol/L (ref 135–146)
Total Bilirubin: 0.5 mg/dL (ref 0.2–1.2)
Total Protein: 7.3 g/dL (ref 6.1–8.1)

## 2016-03-18 LAB — VITAMIN B12: VITAMIN B 12: 682 pg/mL (ref 200–1100)

## 2016-03-18 LAB — POCT URINALYSIS DIP (MANUAL ENTRY)
BILIRUBIN UA: NEGATIVE
Bilirubin, UA: NEGATIVE
Glucose, UA: NEGATIVE
LEUKOCYTES UA: NEGATIVE
Nitrite, UA: NEGATIVE
PH UA: 5.5
PROTEIN UA: NEGATIVE
Urobilinogen, UA: 0.2

## 2016-03-18 LAB — POCT GLYCOSYLATED HEMOGLOBIN (HGB A1C): Hemoglobin A1C: 5

## 2016-03-18 LAB — POCT SEDIMENTATION RATE: POCT SED RATE: 3 mm/h (ref 0–22)

## 2016-03-18 LAB — TSH: TSH: 0.1 m[IU]/L — AB

## 2016-03-18 LAB — POCT URINE PREGNANCY: PREG TEST UR: NEGATIVE

## 2016-03-18 LAB — FOLATE: Folate: 10.4 ng/mL (ref >5.4)

## 2016-03-18 NOTE — Addendum Note (Signed)
Addended by: Kem Boroughs D on: 03/18/2016 05:11 PM   Modules accepted: Orders

## 2016-03-18 NOTE — Patient Instructions (Signed)
     IF you received an x-ray today, you will receive an invoice from [facility]. Please contact [facility] at [phone] with questions or concerns regarding your invoice.   IF you received labwork today, you will receive an invoice from [facility]. Please contact [facility] at [phone] with questions or concerns regarding your invoice.   Our billing staff will not be able to assist you with questions regarding bills from these companies.  You will be contacted with the lab results as soon as they are available. The fastest way to get your results is to activate your My Chart account. Instructions are located on the last page of this paperwork. If you have not heard from us regarding the results in 2 weeks, please contact this office.      

## 2016-03-18 NOTE — Progress Notes (Signed)
By signing my name below, I, Mesha Guinyard, attest that this documentation has been prepared under the direction and in the presence of Philis Fendt, PA-C.  Electronically Signed: Verlee Monte, Medical Scribe. 03/18/16. 2:48 PM.  03/18/2016 4:52 PM   DOB: 07-02-1975 / MRN: [id number]  SUBJECTIVE:  Crystal Mcknight is a 40 y.o. female with a PMHx of hypothyroidism presenting for waxing and waning dizziness onset 1 week. Pt states her dizziness is visually related and it fluctuates in severity throughout the day. Pt took dramamine with little relief to her symptoms. Pt had vertigo 8 years ago, but states this dizziness is different. Pt has a right sided HA today, and she reports chest pain. Pt's chest pain is described as painful tingling with pinching without pressure in her chest and she suspects it's due to anxiety. Pt was in [place] for vacation when her symptoms started and she got back 2 days ago. Pt had some diarrhea while there but suspects it was from her temporary unusual diet of "lots of nachos and tacos" and binge drinking; she drank all day and night every day. During her trip she swam in the ocean and on a sperate occasion she scratched her eye with her ring. Pt seeked medical attention for her corneal abrasion and was given eye drops which immediately gave relief to her symptoms. Pt takes Vit B12 supplements. Pt mentions she had a MRI a few years ago after hitting her head. Pt does not smoke. Pt denies PMHx of DM, or HTN; her bp is normally around 100/60. Pt doesn't think her dizziness is "room spinning dizziness", or "syncopal dizziness". Pt denies sensory change, focal weakness in her hands or feet, change in strength, diarrhea, dysuria, rash, stress, tinnitus, nasal congestion, new cough, sore throat, and nausea.  Pt reports episodic muffled hearing that's described as a "honing in" for a few seconds before her hearing is back to nl. Pt denies hearing loss and change in  hearing.  FHx: Dad needs Vit B12 injections, has myasthenia gravis and had heart dx at 53 y/o. Grandpa had MI in 1 y/o. Mom has a small heart and needed a catheter when pt was in HighSchool   She is allergic to penicillins.   She  has a past medical history of Arthritis; Depression; Hypothyroidism; Pelvic pain; PONV (postoperative nausea and vomiting); SVD (spontaneous vaginal delivery); and Thyroid disease.    She  reports that she has never smoked. She has never used smokeless tobacco. She reports that she drinks alcohol. She reports that she does not use drugs. She  reports that she currently engages in sexual activity. She reports using the following method of birth control/protection: None. The patient  has a past surgical history that includes Appendectomy; Rhinoplasty; Wisdom tooth extraction; and laparoscopy (N/A, 05/31/2013).  Her family history includes Cancer in her maternal grandmother, mother, and paternal grandmother; Heart disease in her maternal grandfather and paternal grandfather; Hyperlipidemia in her father and mother; Hypertension in her father.  Review of Systems  HENT: Negative for congestion, hearing loss, sore throat and tinnitus.        Positive muffled hearing  Respiratory: Negative for cough.   Cardiovascular: Negative for chest pain.  Gastrointestinal: Negative for diarrhea and nausea.  Genitourinary: Negative for dysuria.  Skin: Negative for rash.  Neurological: Positive for dizziness and headaches. Negative for sensory change, focal weakness and loss of consciousness.   The problem list and medications were reviewed and updated by myself where necessary and exist  elsewhere in the encounter.   OBJECTIVE:  BP 126/90 (BP Location: Left Arm, Patient Position: Sitting, Cuff Size: Normal)    Pulse 81    Temp 98.4 F (36.9 C) (Oral)    Resp 17    Ht 5' 7.5" (1.715 m)    Wt 139 lb (63 kg)    LMP 02/23/2016 (Approximate)    SpO2 100%    BMI 21.45 kg/m   Physical Exam   Constitutional: She appears well-developed and well-nourished. No distress.  HENT:  Head: Normocephalic and atraumatic.  Right Ear: Hearing normal.  Left Ear: Hearing normal.  Eyes: Conjunctivae are normal. Right eye exhibits no nystagmus. Left eye exhibits no nystagmus.  Pt states she gets motions sickness but this is a different kind of dizziness.  Neck: Neck supple. No thyromegaly present.  Cardiovascular: Normal rate, regular rhythm and normal heart sounds.  Exam reveals no gallop and no friction rub.   No murmur heard. Pulmonary/Chest: Effort normal and breath sounds normal. No respiratory distress. She has no wheezes. She has no rales.  Neurological: She is alert. No cranial nerve deficit or sensory deficit.  Reflex Scores:      Tricep reflexes are 2+ on the right side and 2+ on the left side.      Bicep reflexes are 2+ on the right side and 2+ on the left side.      Brachioradialis reflexes are 2+ on the right side and 2+ on the left side.      Patellar reflexes are 2+ on the right side and 2+ on the left side.      Achilles reflexes are 2+ on the right side and 2+ on the left side. Single leg stand nl Grip strength is nl Strength in upper and lower extremities is nl No intension tremor noted Rapid alternating movement intact Stereognosis intact Vibration in tact about the thumb and great toes   Skin: Skin is warm and dry.  Psychiatric: She has a normal mood and affect. Her behavior is normal.  Nursing note and vitals reviewed.  Results for orders placed or performed in visit on 03/18/16 (from the past 72 hour(s))  POCT urinalysis dipstick     Status: Abnormal   Collection Time: 03/18/16  3:35 PM  Result Value Ref Range   Color, UA yellow yellow   Clarity, UA clear clear   Glucose, UA negative negative   Bilirubin, UA negative negative   Ketones, POC UA negative negative   Spec Grav, UA <=1.005    Blood, UA trace-intact (A) negative   pH, UA 5.5    Protein Ur, POC  negative negative   Urobilinogen, UA 0.2    Nitrite, UA Negative Negative   Leukocytes, UA Negative Negative  POCT glycosylated hemoglobin (Hb A1C)     Status: None   Collection Time: 03/18/16  3:43 PM  Result Value Ref Range   Hemoglobin A1C 5.0   POCT urine pregnancy     Status: None   Collection Time: 03/18/16  4:07 PM  Result Value Ref Range   Preg Test, Ur Negative Negative  POCT CBC     Status: Abnormal   Collection Time: 03/18/16  4:20 PM  Result Value Ref Range   WBC 5.1 4.6 - 10.2 K/uL   Lymph, poc 1.6 0.6 - 3.4   POC LYMPH PERCENT 31.7 10 - 50 %L   MID (cbc) 0.4 0 - 0.9   POC MID % 8.2 0 - 12 %M   POC  Granulocyte 3.1 2 - 6.9   Granulocyte percent 60.1 37 - 80 %G   RBC 5.45 4.04 - 5.48 M/uL   Hemoglobin 18.9 (A) 12.2 - 16.2 g/dL   HCT, POC 52.5 (A) 37.7 - 47.9 %   MCV 96.3 80 - 97 fL   MCH, POC 34.7 (A) 27 - 31.2 pg   MCHC 36.1 (A) 31.8 - 35.4 g/dL   RDW, POC 12.6 %   Platelet Count, POC 71 (A) 142 - 424 K/uL   MPV 7.7 0 - 99.8 fL  POCT SEDIMENTATION RATE     Status: None   Collection Time: 03/18/16  4:39 PM  Result Value Ref Range   POCT SED RATE 3 0 - 22 mm/hr   Orthostatic VS for the past 24 hrs:  BP- Lying Pulse- Lying BP- Sitting Pulse- Sitting BP- Standing at 0 minutes Pulse- Standing at 0 minutes  03/18/16 1535 118/63 90 119/86 85 136/86 90    [EKG Reading]: She has a nl sinus rhythm. Nl axis. Negative for signs of hypertrophy, ischemia, and infarction. Mildly decrease PR interval at 11.8  ASSESSMENT AND PLAN  Ronniesha was seen today for dizziness.  Diagnoses and all orders for this visit:  Dizziness: She has a very interesting presentation.  CBC is abnormal.  She did just come back from [place] where she apparently drank heavily and consumed a lot of cigarettes. She has a short PR interval and this is likely non contributory however I am sending her to cardiology as she has an abnormal PR interval.    -     POCT SEDIMENTATION RATE -     POCT  urinalysis dipstick -     POCT glycosylated hemoglobin (Hb A1C) -     COMPLETE METABOLIC PANEL WITH GFR -     TSH -     EKG 12-Lead -     Orthostatic vital signs -     POCT urine pregnancy  Microcytosis -     Vitamin B12 -     Folate -     POCT CBC  Shortened PR interval -     Ambulatory referral to Cardiology  Erythrocytosis -     Pathologist smear review -     CBC; Future  Thrombocytopenia ([place]) -     Protime-INR      The patient is advised to call or return to clinic if she does not see an improvement in symptoms, or to seek the care of the closest emergency department if she worsens with the above plan.   This note was scribed in my presence and I performed the services described in the this documentation.   Philis Fendt, MHS, PA-C [facility] 03/18/2016 4:52 PM

## 2016-03-19 ENCOUNTER — Other Ambulatory Visit: Payer: Self-pay | Admitting: Physician Assistant

## 2016-03-19 DIAGNOSIS — D751 Secondary polycythemia: Secondary | ICD-10-CM

## 2016-03-19 DIAGNOSIS — E0590 Thyrotoxicosis, unspecified without thyrotoxic crisis or storm: Secondary | ICD-10-CM

## 2016-03-19 LAB — PATHOLOGIST SMEAR REVIEW

## 2016-03-19 MED ORDER — LEVOTHYROXINE SODIUM 150 MCG PO TABS: 150.0000 ug | ORAL_TABLET | Freq: Every day | ORAL | 3 refills | Status: DC

## 2016-05-28 DIAGNOSIS — J019 Acute sinusitis, unspecified: Secondary | ICD-10-CM | POA: Diagnosis not present

## 2016-05-28 DIAGNOSIS — J111 Influenza due to unidentified influenza virus with other respiratory manifestations: Secondary | ICD-10-CM | POA: Diagnosis not present

## 2016-06-16 DIAGNOSIS — Z01419 Encounter for gynecological examination (general) (routine) without abnormal findings: Secondary | ICD-10-CM | POA: Diagnosis not present

## 2016-06-16 DIAGNOSIS — Z1231 Encounter for screening mammogram for malignant neoplasm of breast: Secondary | ICD-10-CM | POA: Diagnosis not present

## 2016-06-16 DIAGNOSIS — Z6821 Body mass index (BMI) 21.0-21.9, adult: Secondary | ICD-10-CM | POA: Diagnosis not present

## 2016-07-28 ENCOUNTER — Other Ambulatory Visit (HOSPITAL_COMMUNITY): Payer: Self-pay | Admitting: Psychiatry

## 2016-08-10 DIAGNOSIS — Z130 Encounter for screening for diseases of the blood and blood-forming organs and certain disorders involving the immune mechanism: Secondary | ICD-10-CM | POA: Diagnosis not present

## 2016-08-10 DIAGNOSIS — E039 Hypothyroidism, unspecified: Secondary | ICD-10-CM | POA: Diagnosis not present

## 2017-03-06 ENCOUNTER — Other Ambulatory Visit: Payer: Self-pay | Admitting: Physician Assistant

## 2017-07-07 DIAGNOSIS — Z1231 Encounter for screening mammogram for malignant neoplasm of breast: Secondary | ICD-10-CM | POA: Diagnosis not present

## 2017-07-20 DIAGNOSIS — Z6822 Body mass index (BMI) 22.0-22.9, adult: Secondary | ICD-10-CM | POA: Diagnosis not present

## 2017-07-20 DIAGNOSIS — Z01419 Encounter for gynecological examination (general) (routine) without abnormal findings: Secondary | ICD-10-CM | POA: Diagnosis not present

## 2017-07-29 DIAGNOSIS — Z1329 Encounter for screening for other suspected endocrine disorder: Secondary | ICD-10-CM | POA: Diagnosis not present

## 2017-07-29 DIAGNOSIS — Z13220 Encounter for screening for lipoid disorders: Secondary | ICD-10-CM | POA: Diagnosis not present

## 2017-07-29 DIAGNOSIS — Z13228 Encounter for screening for other metabolic disorders: Secondary | ICD-10-CM | POA: Diagnosis not present

## 2017-07-29 DIAGNOSIS — Z1321 Encounter for screening for nutritional disorder: Secondary | ICD-10-CM | POA: Diagnosis not present

## 2017-08-15 ENCOUNTER — Ambulatory Visit: Payer: BLUE CROSS/BLUE SHIELD | Admitting: Physician Assistant

## 2017-08-15 DIAGNOSIS — M9903 Segmental and somatic dysfunction of lumbar region: Secondary | ICD-10-CM | POA: Diagnosis not present

## 2017-08-15 DIAGNOSIS — M62830 Muscle spasm of back: Secondary | ICD-10-CM | POA: Diagnosis not present

## 2017-08-15 DIAGNOSIS — M9902 Segmental and somatic dysfunction of thoracic region: Secondary | ICD-10-CM | POA: Diagnosis not present

## 2017-08-15 DIAGNOSIS — M545 Low back pain: Secondary | ICD-10-CM | POA: Diagnosis not present

## 2017-08-16 DIAGNOSIS — M9902 Segmental and somatic dysfunction of thoracic region: Secondary | ICD-10-CM | POA: Diagnosis not present

## 2017-08-16 DIAGNOSIS — M62830 Muscle spasm of back: Secondary | ICD-10-CM | POA: Diagnosis not present

## 2017-08-16 DIAGNOSIS — M9903 Segmental and somatic dysfunction of lumbar region: Secondary | ICD-10-CM | POA: Diagnosis not present

## 2017-08-16 DIAGNOSIS — M545 Low back pain: Secondary | ICD-10-CM | POA: Diagnosis not present

## 2017-08-18 DIAGNOSIS — M9902 Segmental and somatic dysfunction of thoracic region: Secondary | ICD-10-CM | POA: Diagnosis not present

## 2017-08-18 DIAGNOSIS — M9903 Segmental and somatic dysfunction of lumbar region: Secondary | ICD-10-CM | POA: Diagnosis not present

## 2017-08-18 DIAGNOSIS — M545 Low back pain: Secondary | ICD-10-CM | POA: Diagnosis not present

## 2017-08-18 DIAGNOSIS — M62830 Muscle spasm of back: Secondary | ICD-10-CM | POA: Diagnosis not present

## 2017-08-24 DIAGNOSIS — M9902 Segmental and somatic dysfunction of thoracic region: Secondary | ICD-10-CM | POA: Diagnosis not present

## 2017-08-24 DIAGNOSIS — M545 Low back pain: Secondary | ICD-10-CM | POA: Diagnosis not present

## 2017-08-24 DIAGNOSIS — M62830 Muscle spasm of back: Secondary | ICD-10-CM | POA: Diagnosis not present

## 2017-08-24 DIAGNOSIS — M9903 Segmental and somatic dysfunction of lumbar region: Secondary | ICD-10-CM | POA: Diagnosis not present

## 2017-08-25 DIAGNOSIS — M62830 Muscle spasm of back: Secondary | ICD-10-CM | POA: Diagnosis not present

## 2017-08-25 DIAGNOSIS — M545 Low back pain: Secondary | ICD-10-CM | POA: Diagnosis not present

## 2017-08-25 DIAGNOSIS — M9902 Segmental and somatic dysfunction of thoracic region: Secondary | ICD-10-CM | POA: Diagnosis not present

## 2017-08-25 DIAGNOSIS — M9903 Segmental and somatic dysfunction of lumbar region: Secondary | ICD-10-CM | POA: Diagnosis not present

## 2017-08-26 DIAGNOSIS — M62830 Muscle spasm of back: Secondary | ICD-10-CM | POA: Diagnosis not present

## 2017-08-26 DIAGNOSIS — M545 Low back pain: Secondary | ICD-10-CM | POA: Diagnosis not present

## 2017-08-26 DIAGNOSIS — M9903 Segmental and somatic dysfunction of lumbar region: Secondary | ICD-10-CM | POA: Diagnosis not present

## 2017-08-26 DIAGNOSIS — M9902 Segmental and somatic dysfunction of thoracic region: Secondary | ICD-10-CM | POA: Diagnosis not present

## 2017-08-29 DIAGNOSIS — M545 Low back pain: Secondary | ICD-10-CM | POA: Diagnosis not present

## 2017-08-29 DIAGNOSIS — M9903 Segmental and somatic dysfunction of lumbar region: Secondary | ICD-10-CM | POA: Diagnosis not present

## 2017-08-29 DIAGNOSIS — M9902 Segmental and somatic dysfunction of thoracic region: Secondary | ICD-10-CM | POA: Diagnosis not present

## 2017-08-29 DIAGNOSIS — M62830 Muscle spasm of back: Secondary | ICD-10-CM | POA: Diagnosis not present

## 2018-03-24 ENCOUNTER — Other Ambulatory Visit: Payer: Self-pay

## 2018-03-24 ENCOUNTER — Encounter: Payer: Self-pay | Admitting: Family Medicine

## 2018-03-24 ENCOUNTER — Ambulatory Visit: Payer: BLUE CROSS/BLUE SHIELD | Admitting: Family Medicine

## 2018-03-24 VITALS — BP 121/87 | HR 104 | Temp 97.9°F | Ht 67.5 in | Wt 134.2 lb

## 2018-03-24 DIAGNOSIS — F418 Other specified anxiety disorders: Secondary | ICD-10-CM

## 2018-03-24 DIAGNOSIS — L237 Allergic contact dermatitis due to plants, except food: Secondary | ICD-10-CM | POA: Diagnosis not present

## 2018-03-24 MED ORDER — HYDROXYZINE HCL 25 MG PO TABS: 12.5000 mg | ORAL_TABLET | Freq: Four times a day (QID) | ORAL | 0 refills | Status: DC | PRN

## 2018-03-24 MED ORDER — TRIAMCINOLONE ACETONIDE 0.1 % EX CREA: 1.0000 "application " | TOPICAL_CREAM | Freq: Two times a day (BID) | CUTANEOUS | 0 refills | Status: DC

## 2018-03-24 MED ORDER — PREDNISONE 20 MG PO TABS: ORAL_TABLET | ORAL | 0 refills | Status: DC

## 2018-03-24 NOTE — Patient Instructions (Addendum)
Rash does appear to be poison ivy.  Start prednisone, watch for side effects as we discussed.  Hydroxyzine up to every 6 hours as needed, start with 1/2 pill up to 2 pills if needed but be careful with sedation, or dizziness with that medication.  Topical steroid cream can be used today and tomorrow but once prednisone is in your system, you should not need that cream further.  Okay to continue calamine lotion if needed.  I am sorry to hear about your loss.  I do recommend meeting with a therapist, and will provide a phone number below.  Hydroxyzine can also help with irritability or anxiety symptoms as well as sleep. I recommend follow-up with either your OB/GYN or other primary provider to discuss medications further as needed.  Let us know if we can help further.  [facility] [facility]: [phone]   Poison Ivy Dermatitis Poison ivy dermatitis is inflammation of the skin that is caused by the allergens on the leaves of the poison ivy plant. The skin reaction often involves redness, swelling, blisters, and extreme itching. What are the causes? This condition is caused by a specific chemical (urushiol) found in the sap of the poison ivy plant. This chemical is sticky and can be easily spread to people, animals, and objects. You can get poison ivy dermatitis by:  Having direct contact with a poison ivy plant.  Touching animals, other people, or objects that have come in contact with poison ivy and have the chemical on them.  What increases the risk? This condition is more likely to develop in:  People who are outdoors often.  People who go outdoors without wearing protective clothing, such as closed shoes, long pants, and a long-sleeved shirt.  What are the signs or symptoms? Symptoms of this condition include:  Redness and itching.  A rash that often includes bumps and blisters. The rash usually appears 48 hours after exposure.  Swelling. This may occur if the reaction is more  severe.  Symptoms usually last for 1-2 weeks. However, the first time you develop this condition, symptoms may last 3-4 weeks. How is this diagnosed? This condition may be diagnosed based on your symptoms and a physical exam. Your health care provider may also ask you about any recent outdoor activity. How is this treated? Treatment for this condition will vary depending on how severe it is. Treatment may include:  Hydrocortisone creams or calamine lotions to relieve itching.  Oatmeal baths to soothe the skin.  Over-the-counter antihistamine tablets.  Oral steroid medicine for more severe outbreaks.  Follow these instructions at home:  Take or apply over-the-counter and prescription medicines only as told by your health care provider.  Wash exposed skin as soon as possible with soap and cold water.  Use hydrocortisone creams or calamine lotion as needed to soothe the skin and relieve itching.  Take oatmeal baths as needed. Use colloidal oatmeal. You can get this at your local pharmacy or grocery store. Follow the instructions on the packaging.  Do not scratch or rub your skin.  While you have the rash, wash clothes right after you wear them. How is this prevented?  Learn to identify the poison ivy plant and avoid contact with the plant. This plant can be recognized by the number of leaves. Generally, poison ivy has three leaves with flowering branches on a single stem. The leaves are typically glossy, and they have jagged edges that come to a point at the front.  If you have been exposed  to poison ivy, thoroughly wash with soap and water right away. You have about 30 minutes to remove the plant resin before it will cause the rash. Be sure to wash under your fingernails because any plant resin there will continue to spread the rash.  When hiking or camping, wear clothes that will help you to avoid exposure on the skin. This includes long pants, a long-sleeved shirt, tall socks, and  hiking boots. You can also apply preventive lotion to your skin to help limit exposure.  If you suspect that your clothes or outdoor gear came in contact with poison ivy, rinse them off outside with a garden hose before you bring them inside your house. Contact a health care provider if:  You have open sores in the rash area.  You have more redness, swelling, or pain in the affected area.  You have redness that spreads beyond the rash area.  You have fluid, blood, or pus coming from the affected area.  You have a fever.  You have a rash over a large area of your body.  You have a rash on your eyes, mouth, or genitals.  Your rash does not improve after a few days. Get help right away if:  Your face swells or your eyes swell shut.  You have trouble breathing.  You have trouble swallowing. This information is not intended to replace advice given to you by your health care provider. Make sure you discuss any questions you have with your health care provider. Document Released: 05/07/2000 Document Revised: 10/16/2015 Document Reviewed: 10/16/2014 Elsevier Interactive Patient Education  [employer].   If you have lab work done today you will be contacted with your lab results within the next 2 weeks.  If you have not heard from [place] then please contact us. The fastest way to get your results is to register for My Chart.   IF you received an x-ray today, you will receive an invoice from [facility]. Please contact [facility] at [phone] with questions or concerns regarding your invoice.   IF you received labwork today, you will receive an invoice from [facility]. Please contact [facility] at [phone] with questions or concerns regarding your invoice.   Our billing staff will not be able to assist you with questions regarding bills from these companies.  You will be contacted with the lab results as soon as they are available. The fastest way to get your  results is to activate your My Chart account. Instructions are located on the last page of this paperwork. If you have not heard from [place] regarding the results in 2 weeks, please contact this office.

## 2018-03-24 NOTE — Progress Notes (Signed)
Subjective:  By signing my name below, I, Moises Blood, attest that this documentation has been prepared under the direction and in the presence of Merri Ray, MD. Electronically Signed: Moises Blood, [place]. 03/24/2018 , 5:02 PM .  Patient was seen in Room 10 .   Patient ID: Crystal Mcknight, female    DOB: December 09, 1975, 42 y.o.   MRN: [id number] Chief Complaint  Patient presents with  . Possible Poison ivy outbreak    rash on arms and legs. started 3 days ago.  . itchy everywhere    started on legs and it is now spreading to arms.   HPI Crystal Mcknight is a 42 y.o. female  Patient states she's having an itchy rash that initially started over her right knee about 3 days ago. Then, she noticed rash appear over her left arm and left knee yesterday. She noticed right arm becoming affected this morning. She denies any rash in her genitalia or in her mouth. She does report working out in her yard, pulling the ivy off the trees. She mentions never having reactions to poison ivy like this. She denies any fevers. She's been taking benadryl for the itchiness, one tablet every few hours. She's also been applying calamine lotion and rubbing alcohol over the affected areas. Her mother had similar reaction to poison ivy when she was in her late 36s - early 70s.   She also mentions her brother recently passed away, less than a month ago. She's been on Zoloft 25 mg qd; been on it for 8 years. Her OBGYN prescribes her Zoloft. She hasn't spoken with counseling or therapy; she's been feeling angry.   Patient Active Problem List   Diagnosis Date Noted  . 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 Degenerative arthritis of finger 09/28/2012   Past Medical History:  Diagnosis Date  . Arthritis    hands  . Depression   . Hypothyroidism   . Pelvic pain   . PONV (postoperative nausea and vomiting)   . SVD (spontaneous vaginal delivery)    x 2  . Thyroid disease    Past Surgical History:  Procedure Laterality  Date  . APPENDECTOMY    . LAPAROSCOPY N/A 05/31/2013   Procedure: LAPAROSCOPY OPERATIVE, FULGURATION OF ENDOMETRIOSIS, REMOVAL OF BILATERAL FALLOPIAN TUBE CYST;  Surgeon: Marylynn Pearson, MD;  Location: [facility];  Service: Gynecology;  Laterality: N/A;  . RHINOPLASTY    . WISDOM TOOTH EXTRACTION     Allergies  Allergen Reactions  . Penicillins Rash    When she was 42years old, had a rash when took this for strep throat   Prior to Admission medications   Medication Sig Start Date End Date Taking? Authorizing Provider  fluticasone (FLONASE) 50 MCG/ACT nasal spray Place into both nostrils daily.    [provider]  levothyroxine (SYNTHROID, LEVOTHROID) 150 MCG tablet Take 1 tablet (150 mcg total) by mouth daily. 03/19/16   Tereasa Coop, PA-C  Multiple Vitamin (MULTIVITAMIN WITH MINERALS) TABS tablet Take 1 tablet by mouth daily.    [provider]  sertraline (ZOLOFT) 25 MG tablet Take 25 mg by mouth daily.    [provider]   Social History   Socioeconomic History  . Marital status: Married    Spouse name: Not on file  . Number of children: Not on file  . Years of education: Not on file  . Highest education level: Not on file  Occupational History  . Not on file  Social Needs  . Emergency planning/management officer  strain: Not on file  . Food insecurity:    Worry: Not on file    Inability: Not on file  . Transportation needs:    Medical: Not on file    Non-medical: Not on file  Tobacco Use  . Smoking status: Never Smoker  . Smokeless tobacco: Never Used  Substance and Sexual Activity  . Alcohol use: Yes    Alcohol/week: 7.0 - 14.0 standard drinks    Types: 7 - 14 Glasses of wine per week    Comment: 1-2 glasses wind daily  . Drug use: No  . Sexual activity: Yes    Birth control/protection: None    Comment: husband vasectomy  Lifestyle  . Physical activity:    Days per week: Not on file    Minutes per session: Not on file  . Stress: Not on file    Relationships  . Social connections:    Talks on phone: Not on file    Gets together: Not on file    Attends religious service: Not on file    Active member of club or organization: Not on file    Attends meetings of clubs or organizations: Not on file    Relationship status: Not on file  . Intimate partner violence:    Fear of current or ex partner: Not on file    Emotionally abused: Not on file    Physically abused: Not on file    Forced sexual activity: Not on file  Other Topics Concern  . Not on file  Social History Narrative  . Not on file   Review of Systems  Constitutional: Negative for chills, fatigue, fever and unexpected weight change.  Respiratory: Negative for cough.   Gastrointestinal: Negative for constipation, diarrhea, nausea and vomiting.  Skin: Positive for rash. Negative for wound.  Neurological: Negative for dizziness, weakness and headaches.       Objective:   Physical Exam  Constitutional: She is oriented to person, place, and time. She appears well-developed and well-nourished. No distress.  HENT:  Head: Normocephalic and atraumatic.  Mouth/Throat: Oropharynx is clear and moist and mucous membranes are normal. No oral lesions.  Eyes: Pupils are equal, round, and reactive to light. EOM are normal.  Neck: Neck supple.  Cardiovascular: Normal rate.  Pulmonary/Chest: Effort normal. No respiratory distress.  Musculoskeletal: Normal range of motion.  Neurological: She is alert and oriented to person, place, and time.  Skin: Skin is warm and dry.  Left arm: clusters of fine papules and vesicles across her left forearm including 1 linear area Right arm: other clustered vesicles and 1 patch on forearm, one other linear area on the forearm Hands are clear Left leg: few patches on left knee and left calf Right leg: large patch over right anterior shin Face and neck are clear  Psychiatric: She has a normal mood and affect. Her behavior is normal.  Nursing  note and vitals reviewed.   Vitals:   03/24/18 1629  BP: 121/87  Pulse: (!) 104  Temp: 97.9 F (36.6 C)  TempSrc: Oral  SpO2: 98%  Weight: 134 lb 3.2 oz (60.9 kg)  Height: 5' 7.5" (1.715 m)       Assessment & Plan:   Crystal Mcknight is a 42 y.o. female Poison ivy dermatitis - Plan: hydrOXYzine (ATARAX/VISTARIL) 25 MG tablet, predniSONE (DELTASONE) 20 MG tablet, triamcinolone cream (KENALOG) 0.1 %  -Rash appears consistent with poison ivy dermatitis.  Now with multiple areas of spread.  -Prednisone taper decided due  to quick spread.  Potential side effects and risk discussed including close monitoring for any new psychiatric side effects. Understanding expressed.   -Topical triamcinolone to affected areas for now and Atarax as needed for itching with side effects also discussed.  -RTC precautions given  Situational anxiety - Plan: hydrOXYzine (ATARAX/VISTARIL) 25 MG tablet  -Situational anxiety with grief.  Recommended meeting with therapist, phone number provided, but hydroxyzine may also be helpful.  -Currently on Zoloft, would consider change in dose but can be discussed with primary care provider or follow-up to review other changes if needed.  Meds ordered this encounter  Medications  . hydrOXYzine (ATARAX/VISTARIL) 25 MG tablet    Sig: Take 0.5-2 tablets (12.5-50 mg total) by mouth every 6 (six) hours as needed for anxiety or itching.    Dispense:  30 tablet    Refill:  0  . predniSONE (DELTASONE) 20 MG tablet    Sig: 3 by mouth for 3 days, then 2 by mouth for 2 days, then 1 by mouth for 2 days, then 1/2 by mouth for 2 days.    Dispense:  16 tablet    Refill:  0  . triamcinolone cream (KENALOG) 0.1 %    Sig: Apply 1 application topically 2 (two) times daily.    Dispense:  30 g    Refill:  0   Patient Instructions   Rash does appear to be poison ivy.  Start prednisone, watch for side effects as we discussed.  Hydroxyzine up to every 6 hours as needed, start with 1/2  pill up to 2 pills if needed but be careful with sedation, or dizziness with that medication.  Topical steroid cream can be used today and tomorrow but once prednisone is in your system, you should not need that cream further.  Okay to continue calamine lotion if needed.  I am sorry to hear about your loss.  I do recommend meeting with a therapist, and will provide a phone number below.  Hydroxyzine can also help with irritability or anxiety symptoms as well as sleep. I recommend follow-up with either your OB/GYN or other primary provider to discuss medications further as needed.  Let us know if we can help further.  [place] [facility]: [phone]   Poison Ivy Dermatitis Poison ivy dermatitis is inflammation of the skin that is caused by the allergens on the leaves of the poison ivy plant. The skin reaction often involves redness, swelling, blisters, and extreme itching. What are the causes? This condition is caused by a specific chemical (urushiol) found in the sap of the poison ivy plant. This chemical is sticky and can be easily spread to people, animals, and objects. You can get poison ivy dermatitis by:  Having direct contact with a poison ivy plant.  Touching animals, other people, or objects that have come in contact with poison ivy and have the chemical on them.  What increases the risk? This condition is more likely to develop in:  People who are outdoors often.  People who go outdoors without wearing protective clothing, such as closed shoes, long pants, and a long-sleeved shirt.  What are the signs or symptoms? Symptoms of this condition include:  Redness and itching.  A rash that often includes bumps and blisters. The rash usually appears 48 hours after exposure.  Swelling. This may occur if the reaction is more severe.  Symptoms usually last for 1-2 weeks. However, the first time you develop this condition, symptoms may last 3-4 weeks. How is this  diagnosed? This  condition may be diagnosed based on your symptoms and a physical exam. Your health care provider may also ask you about any recent outdoor activity. How is this treated? Treatment for this condition will vary depending on how severe it is. Treatment may include:  Hydrocortisone creams or calamine lotions to relieve itching.  Oatmeal baths to soothe the skin.  Over-the-counter antihistamine tablets.  Oral steroid medicine for more severe outbreaks.  Follow these instructions at home:  Take or apply over-the-counter and prescription medicines only as told by your health care provider.  Wash exposed skin as soon as possible with soap and cold water.  Use hydrocortisone creams or calamine lotion as needed to soothe the skin and relieve itching.  Take oatmeal baths as needed. Use colloidal oatmeal. You can get this at your local pharmacy or grocery store. Follow the instructions on the packaging.  Do not scratch or rub your skin.  While you have the rash, wash clothes right after you wear them. How is this prevented?  Learn to identify the poison ivy plant and avoid contact with the plant. This plant can be recognized by the number of leaves. Generally, poison ivy has three leaves with flowering branches on a single stem. The leaves are typically glossy, and they have jagged edges that come to a point at the front.  If you have been exposed to poison ivy, thoroughly wash with soap and water right away. You have about 30 minutes to remove the plant resin before it will cause the rash. Be sure to wash under your fingernails because any plant resin there will continue to spread the rash.  When hiking or camping, wear clothes that will help you to avoid exposure on the skin. This includes long pants, a long-sleeved shirt, tall socks, and hiking boots. You can also apply preventive lotion to your skin to help limit exposure.  If you suspect that your clothes or outdoor gear came in contact  with poison ivy, rinse them off outside with a garden hose before you bring them inside your house. Contact a health care provider if:  You have open sores in the rash area.  You have more redness, swelling, or pain in the affected area.  You have redness that spreads beyond the rash area.  You have fluid, blood, or pus coming from the affected area.  You have a fever.  You have a rash over a large area of your body.  You have a rash on your eyes, mouth, or genitals.  Your rash does not improve after a few days. Get help right away if:  Your face swells or your eyes swell shut.  You have trouble breathing.  You have trouble swallowing. This information is not intended to replace advice given to you by your health care provider. Make sure you discuss any questions you have with your health care provider. Document Released: 05/07/2000 Document Revised: 10/16/2015 Document Reviewed: 10/16/2014 Elsevier Interactive Patient Education  [employer].   If you have lab work done today you will be contacted with your lab results within the next 2 weeks.  If you have not heard from [place] then please contact us. The fastest way to get your results is to register for My Chart.   IF you received an x-ray today, you will receive an invoice from [facility]. Please contact [facility] at [phone] with questions or concerns regarding your invoice.   IF you received labwork today, you will receive  an Pharmacologist from [facility]. Please contact [facility] at [phone] with questions or concerns regarding your invoice.   Our billing staff will not be able to assist you with questions regarding bills from these companies.  You will be contacted with the lab results as soon as they are available. The fastest way to get your results is to activate your My Chart account. Instructions are located on the last page of this paperwork. If you have not heard from [place] regarding the  results in 2 weeks, please contact this office.

## 2018-03-26 ENCOUNTER — Encounter: Payer: Self-pay | Admitting: Family Medicine

## 2018-04-06 DIAGNOSIS — J209 Acute bronchitis, unspecified: Secondary | ICD-10-CM | POA: Diagnosis not present

## 2018-04-10 ENCOUNTER — Ambulatory Visit: Payer: Self-pay | Admitting: *Deleted

## 2018-04-10 ENCOUNTER — Ambulatory Visit (HOSPITAL_COMMUNITY)
Admission: EM | Admit: 2018-04-10 | Discharge: 2018-04-10 | Disposition: A | Discharge status: Home / Self Care | Payer: BLUE CROSS/BLUE SHIELD

## 2018-04-10 ENCOUNTER — Other Ambulatory Visit: Payer: Self-pay

## 2018-04-10 ENCOUNTER — Encounter (HOSPITAL_COMMUNITY): Payer: Self-pay | Admitting: Emergency Medicine

## 2018-04-10 DIAGNOSIS — J189 Pneumonia, unspecified organism: Secondary | ICD-10-CM | POA: Diagnosis not present

## 2018-04-10 MED ORDER — CEFTRIAXONE SODIUM 1 G IJ SOLR
INTRAMUSCULAR | Status: AC
  Filled 2018-04-10: qty 10

## 2018-04-10 MED ORDER — CEFTRIAXONE SODIUM 1 G IJ SOLR
1.0000 g | Freq: Once | INTRAMUSCULAR | Status: AC
  Administered 2018-04-10: 1 g via INTRAMUSCULAR

## 2018-04-10 MED ORDER — LIDOCAINE HCL (PF) 1 % IJ SOLN
INTRAMUSCULAR | Status: AC
  Filled 2018-04-10: qty 4

## 2018-04-10 MED ORDER — AZITHROMYCIN 250 MG PO TABS: 250.0000 mg | ORAL_TABLET | Freq: Every day | ORAL | 0 refills | Status: DC

## 2018-04-10 MED ORDER — HYDROCODONE-HOMATROPINE 5-1.5 MG/5ML PO SYRP: 5.0000 mL | ORAL_SOLUTION | Freq: Four times a day (QID) | ORAL | 0 refills | Status: DC | PRN

## 2018-04-10 MED ORDER — FLUCONAZOLE 150 MG PO TABS: 150.0000 mg | ORAL_TABLET | Freq: Every day | ORAL | 0 refills | Status: DC
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
## 2018-04-10 NOTE — ED Triage Notes (Signed)
Pt states she started feeling bad last Wednesday.  She went to the [facility] before the weekend and was prescribed an inhaler and Tessalon.  Pt was in the mountains this weekend and last night her cough intensified and she is SOB, shaky and has body aches.

## 2018-04-10 NOTE — Telephone Encounter (Signed)
Patient calling with all day and night coughing spells that began 6 days ago. On Thursday last week she was seen at [facility] and given albuterol inhaler and tessalon perles for viral infection. In the interim, [place] and feels worse with wheezing and possible fever over the last few days. Reports chest, sides and back are sore from coughing. Small amount of yellow phlegm with cough.  Hurting all over. She has been taking advil around the clock and using inhaler and perles without any improvement. Referred to [facility] for evaluation. No PCP availability x48h. Reason for Disposition . Wheezing is present  Answer Assessment - Initial Assessment Questions 1. ONSET: "When did the cough begin?"      Mid last week 2. SEVERITY: "How bad is the cough today?"      Severe when she breaths out. Also worse at night.  3. RESPIRATORY DISTRESS: "Describe your breathing."      Wheezing with exhaling. Nasal congestion also. 4. FEVER: "Do you have a fever?" If so, ask: "What is your temperature, how was it measured, and when did it start?"     No thermometer but has had the chills takes advil and sweats. 5. SPUTUM: "Describe the color of your sputum" (clear, white, yellow, green)     Yellowish phlegm. 6. HEMOPTYSIS: "Are you coughing up any blood?" If so ask: "How much?" (flecks, streaks, tablespoons, etc.)     no 7. CARDIAC HISTORY: "Do you have any history of heart disease?" (e.g., heart attack, congestive heart failure)      no 8. LUNG HISTORY: "Do you have any history of lung disease?"  (e.g., pulmonary embolus, asthma, emphysema)     no 9. PE RISK FACTORS: "Do you have a history of blood clots?" (or: recent major surgery, recent prolonged travel, bedridden)     no 10. OTHER SYMPTOMS: "Do you have any other symptoms?" (e.g., runny nose, wheezing, chest pain)       Chest and sides feel sore from coughing all day and night.  11. PREGNANCY: "Is there any chance you are  pregnant?" "When was your last menstrual period?"       no 12. TRAVEL: "Have you traveled out of the country in the last month?" (e.g., travel history, exposures)       no  Protocols used: [place]

## 2018-04-10 NOTE — Discharge Instructions (Addendum)
We will go ahead and treat you today for pneumonia I am giving you an injection here of antibiotics Most of sending you home with antibiotics to take orally Hycodan for cough Diflucan for yeast infection If you develop any worsening symptoms over the next 48 hours please follow-up For more severe cough, shortness of breath, chest pain or any worsening symptoms please go the ER

## 2018-04-28 ENCOUNTER — Ambulatory Visit (HOSPITAL_COMMUNITY): Payer: Self-pay | Admitting: Psychology

## 2018-05-11 ENCOUNTER — Ambulatory Visit: Payer: BLUE CROSS/BLUE SHIELD | Admitting: Emergency Medicine

## 2018-05-29 ENCOUNTER — Ambulatory Visit (INDEPENDENT_AMBULATORY_CARE_PROVIDER_SITE_OTHER): Payer: Self-pay | Admitting: Licensed Clinical Social Worker

## 2018-05-29 DIAGNOSIS — F1020 Alcohol dependence, uncomplicated: Secondary | ICD-10-CM

## 2018-05-29 DIAGNOSIS — F1994 Other psychoactive substance use, unspecified with psychoactive substance-induced mood disorder: Secondary | ICD-10-CM

## 2018-05-31 ENCOUNTER — Other Ambulatory Visit (HOSPITAL_COMMUNITY): Payer: BLUE CROSS/BLUE SHIELD | Attending: Psychiatry | Admitting: Psychology

## 2018-05-31 DIAGNOSIS — F341 Dysthymic disorder: Secondary | ICD-10-CM | POA: Insufficient documentation

## 2018-05-31 DIAGNOSIS — F88 Other disorders of psychological development: Secondary | ICD-10-CM | POA: Diagnosis not present

## 2018-05-31 DIAGNOSIS — Z880 Allergy status to penicillin: Secondary | ICD-10-CM | POA: Diagnosis not present

## 2018-05-31 DIAGNOSIS — F4310 Post-traumatic stress disorder, unspecified: Secondary | ICD-10-CM | POA: Diagnosis not present

## 2018-05-31 DIAGNOSIS — Z6372 Alcoholism and drug addiction in family: Secondary | ICD-10-CM | POA: Insufficient documentation

## 2018-05-31 DIAGNOSIS — E039 Hypothyroidism, unspecified: Secondary | ICD-10-CM | POA: Insufficient documentation

## 2018-05-31 DIAGNOSIS — Z79890 Hormone replacement therapy (postmenopausal): Secondary | ICD-10-CM | POA: Diagnosis not present

## 2018-05-31 DIAGNOSIS — F419 Anxiety disorder, unspecified: Secondary | ICD-10-CM | POA: Diagnosis not present

## 2018-05-31 DIAGNOSIS — R61 Generalized hyperhidrosis: Secondary | ICD-10-CM | POA: Diagnosis not present

## 2018-05-31 DIAGNOSIS — Z8249 Family history of ischemic heart disease and other diseases of the circulatory system: Secondary | ICD-10-CM | POA: Diagnosis not present

## 2018-05-31 DIAGNOSIS — T50905A Adverse effect of unspecified drugs, medicaments and biological substances, initial encounter: Secondary | ICD-10-CM | POA: Insufficient documentation

## 2018-05-31 DIAGNOSIS — F1020 Alcohol dependence, uncomplicated: Secondary | ICD-10-CM

## 2018-05-31 DIAGNOSIS — Z79899 Other long term (current) drug therapy: Secondary | ICD-10-CM | POA: Insufficient documentation

## 2018-05-31 DIAGNOSIS — Z7951 Long term (current) use of inhaled steroids: Secondary | ICD-10-CM | POA: Diagnosis not present

## 2018-06-01 ENCOUNTER — Encounter (HOSPITAL_COMMUNITY): Payer: Self-pay

## 2018-06-01 ENCOUNTER — Other Ambulatory Visit (HOSPITAL_COMMUNITY): Payer: BLUE CROSS/BLUE SHIELD | Admitting: Psychology

## 2018-06-01 ENCOUNTER — Other Ambulatory Visit (HOSPITAL_COMMUNITY): Payer: BLUE CROSS/BLUE SHIELD

## 2018-06-01 ENCOUNTER — Encounter (HOSPITAL_COMMUNITY): Payer: Self-pay | Admitting: Licensed Clinical Social Worker

## 2018-06-01 DIAGNOSIS — F1994 Other psychoactive substance use, unspecified with psychoactive substance-induced mood disorder: Secondary | ICD-10-CM

## 2018-06-01 DIAGNOSIS — F1020 Alcohol dependence, uncomplicated: Secondary | ICD-10-CM

## 2018-06-01 NOTE — Progress Notes (Signed)
    Daily Group Progress Note  Program: CD-IOP   06/01/2018 Crystal Mcknight [id number]  Diagnosis:  Alcohol use disorder, severe, dependence ([place])   Sobriety Date: 04-27-18  Group Time: 1-2:30pm  Participation Level: Active  Behavioral Response: Appropriate  Type of Therapy: Process Group  Interventions: Supportive  Topic: Process: The first part of group began with process. Group members shared their experiences in early recovery since we last met on Monday. One new group member was present and she shared about her experiences that led her to treatment. One group member was absent due to a migraine headache. No drug tests were collected. One group member met with the Medical Director to complete his discharge summary.      Group Time: 2:30-4pm  Participation Level: Active  Behavioral Response: Appropriate  Type of Therapy: Psycho-education Group  Interventions: Supportive  Topic: Psycho-ed: Recovery Icebreaker & Post Acute Withdrawal Syndrome (PAWS); The second half of group was spent in psycho-ed discussing what different "hot topic" words in recovery mean to each of the group members and then discussing PAWS. For the icebreaker activity, group members each randomly chose a popsicle stick out of a jar that had a "hot topic" recovery word written on it. Group members were instructed to share about what their word means for them. After this activity was complete, a handout was passed around discussing PAWS. Group members took turns reading the handout and sharing about their experiences with PAWS symptoms. Group concluded with a mindfulness breathing exercise.    Summary: This was the patients first CD-IOP group counseling session. The patient reported that she attended [facility] for 28 days prior to starting group. She shared about her drinking for the past 10 years, explaining that it it gradually worsened over time after she and her family moved to [place] from [place].  The patient shared that 2019 was a particularly rough year for her, as her brother and beloved aunt died as well as her mother had a near fatal heart attack. 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 The patient shared about her brothers being problem kids and her desire to be the "good child." The patient is having marital problems with her husband due to her drinking behavior before she left for [facility] and the amount of stress he was under while she was away at treatment. The patient shared that she is giving him time to process his emotions and is trying to give him time to heal. During the psycho-ed portion of group, the patients popsicle word was "grief." The patient shared that her word gave her chills. She shared that she has spent a lot of time being the person that holds it together for everyone else and does not allow herself to grieve. The patient shared that she grieves by "doing." The patient shared that she recognizes that "doing" can also keep her from grieving. The patient shared that she wants to honor her deceased loved ones by living sober. The patient shared that she has experienced PAWS symptoms of not being able to think clearly and sleep problems.   UDS collected: No  AA/NA attended?: Yes  Sponsor?: No   Brandon Melnick, LCAS 06/01/2018 11:27 AM

## 2018-06-01 NOTE — Progress Notes (Signed)
Comprehensive Clinical Assessment (CCA) Note  06/01/2018 Corvette Orser [id number]  Visit Diagnosis:      ICD-10-CM   1. Alcohol use disorder, severe, dependence ([place]) F10.20   2. Substance induced mood disorder ([facility]) F19.94       CCA Part One  Part One has been completed on paper by the patient.  (See scanned document in Chart Review)  CCA Part Two A  Intake/Chief Complaint:  CCA Intake With Chief Complaint CCA Part Two Date: 05/29/18 CCA Part Two Time: 30 Chief Complaint/Presenting Problem: Referred by friend to start CD-IOP for alcoholism, daily alcohol abuser for last 2 years, daily drinker for last 08/24/22. Grief, death and loss in family in 09-10-2017. Recent D/C from [place] on 05/26/18, successfully completed and wants to build on success there. Patients Currently Reported Symptoms/Problems: General Anxiety, feeling down, hopeless, trouble relaxing, resless, irritable, cravings, thoughts of using.  Collateral Involvement: Referred by former graduate of CD-IOP Individual's Strengths: Intelligent, hard working, love and support from family, multiple degrees Individual's Preferences: NA Individual's Abilities: Able bodied, thoughtful, articulate, leadership skils Type of Services Patient Feels Are Needed: Substance Abuse IOP  Mental Health Symptoms Depression:  Depression: Change in energy/activity, Difficulty Concentrating, Fatigue, Hopelessness, Increase/decrease in appetite, Irritability, Sleep (too much or little), Tearfulness, Worthlessness  Mania:     Anxiety:   Anxiety: Difficulty concentrating, Fatigue, Irritability, Restlessness, Sleep, Tension, Worrying  Psychosis:     Trauma:     Obsessions:     Compulsions:     Inattention:     Hyperactivity/Impulsivity:     Oppositional/Defiant Behaviors:     Borderline Personality:     Other Mood/Personality Symptoms:      Mental Status Exam Appearance and self-care  Stature:  Stature: Average  Weight:  Weight: Thin   Clothing:  Clothing: Neat/clean  Grooming:  Grooming: Normal  Cosmetic use:  Cosmetic Use: None  Posture/gait:  Posture/Gait: Normal  Motor activity:  Motor Activity: Not Remarkable  Sensorium  Attention:  Attention: Normal  Concentration:  Concentration: Normal  Orientation:  Orientation: X5  Recall/memory:  Recall/Memory: Normal  Affect and Mood  Affect:  Affect: Appropriate, Other (Comment)(smiles/laughs incongruently)  Mood:  Mood: Anxious  Relating  Eye contact:  Eye Contact: Normal  Facial expression:  Facial Expression: Responsive  Attitude toward examiner:  Attitude Toward Examiner: Cooperative  Thought and Language  Speech flow: Speech Flow: Normal  Thought content:  Thought Content: Appropriate to mood and circumstances  Preoccupation:     Hallucinations:     Organization:     Transport planner of Knowledge:  Fund of Knowledge: Average  Intelligence:  Intelligence: Above Average  Abstraction:  Abstraction: Normal  Judgement:  Judgement: Common-sensical  Reality Testing:  Reality Testing: Realistic  Insight:  Insight: Good  Decision Making:  Decision Making: Only simple  Social Functioning  Social Maturity:  Social Maturity: Responsible  Social Judgement:  Social Judgement: Normal  Stress  Stressors:  Stressors: Family conflict  Coping Ability:  Coping Ability: Overwhelmed, Materials engineer Deficits:     Supports:      Family and Psychosocial History: Family history Marital status: Married Number of Years Married: 52 What types of issues is patient dealing with in the relationship?: Husband is "an ass sometimes; He's fed up w/ my drinking." What is your sexual orientation?: heterosexual Does patient have children?: Yes How many children?: 2 How is patient's relationship with their children?: Good, "They never missed out on my love or attention when I  was drinking."  Childhood History:  Childhood History By whom was/is the patient raised?: Both  parents, Mother/father and step-parent Additional childhood history information: Biological parents divorced before pt remembers, pt grew up mostly w/ Mother/Stepfather and 2 step brothers Description of patient's relationship with caregiver when they were a child: "I had to be the good child because my brothers were horrible influences and caused lots of stress to our family." Patient's description of current relationship with people who raised him/her: "I've been helping and supporting my parents this past year since we lost my brother in 2018-02-21. My prarents grieved heavily. They're pretty strick on my and getting sober now." Does patient have siblings?: Yes Number of Siblings: 2 Description of patient's current relationship with siblings: 2 Older step brothers, calls them "brothers", one died in 02/21/18 of alcohol-related issues, one is still alive but is not very close to PT. 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, research (medical), product sales How long has patient been employed?: 3 years at [employer], before that, Development worker, international aid careers Patient's job has been impacted by current illness: Yes Describe how patient's job has been impacted: lower performance What is the longest time patient has a held a job?: 20+ years Are There Guns or Other Weapons in [place]?: Yes Types of Guns/Weapons:  Comptroller Are These Psychologist, educational?: Yes  Education: Education Last Grade Completed: 12 Name of [place]: HS in [place] Did You Graduate From [employer]?: Yes Did Physicist, medical?: Yes What Type of College Degree Do you Have?: BA in Sociology and 2nd BA in Nursing Did [place]?: No What Was Your Major?: Women's Studies, Conservation officer, nature, Nursing Did You Have Any Difficulty At [employer]?: No  Religion: Religion/Spirituality Are You A Religious Person?: No How Might This Affect Treatment?: "I'm spiritual"  Leisure/Recreation: Leisure / Recreation Leisure and Hobbies: [employer], tennis, skiing  Exercise/Diet: Exercise/Diet Do You Exercise?: Yes What Type of Exercise Do You Do?: Run/Walk How Many Times a Week Do You Exercise?: 1-3 times a week Have You Gained or Lost A Significant Amount of Weight in the Past Six Months?: No Do You Follow a Special Diet?: No Do You Have Any Trouble Sleeping?: Yes Explanation of Sleeping Difficulties: Currently taking Trazadone for restlessness  CCA Part Two C  Alcohol/Drug Use: Alcohol / Drug Use Prescriptions: See EHR History of alcohol / drug use?: Yes Longest period of sobriety (when/how long): currently- 1 mo sober Negative Consequences of Use: Personal relationships Substance #1 Name of Substance 1: Alcohol- Wine, Vodka 1 - Age of First Use: 10- Exposed to it by older brothers 1 - Amount (size/oz): 6-10 standard drinks  1 - Frequency: daily 1 - Duration: past 2 years 1 - Last Use / Amount: 04/27/18 Substance #2 Name of Substance 2: Marijuana- Edible 2 - Age of First Use: 11 2 - Amount (size/oz): "not much" 2 - Frequency: "got progressively worse towards end of 2019, but mostly occassionally"  2 - Duration: Last 1-2 years 2 - Last Use / Amount: 04/27/18 Substance #3 Name of Substance 3: Cocaine 3 - Age of First Use: 16 3 - Amount (size/oz): "don't remember" 3 - Frequency: "don't remember" 3  - Duration: "Only during college during parties or concerts" 3 - Last Use / Amount: 20 years ago Substance #4 Name of Substance 4: Ecstasy 4 - Age of First Use: 16 4 - Amount (size/oz): "don't remember" 4 - Frequency: "occassionally at parties and concerts" 4 - Duration: "only during college 4 - Last Use / Amount: 20 years ago              CCA Part Three  ASAM's:  Six Dimensions of Multidimensional Assessment  Dimension 1:  Acute Intoxication and/or Withdrawal Potential:     Dimension 2:  Biomedical Conditions and Complications:     Dimension 3:  Emotional, Behavioral, or Cognitive Conditions and Complications:     Dimension 4:  Readiness to Change:     Dimension 5:  Relapse, Continued use, or Continued Problem Potential:     Dimension 6:  Recovery/Living Environment:      Substance use Disorder (SUD) Substance Use Disorder (SUD)  Checklist Symptoms of Substance Use: Continued use despite having a persistent/recurrent physical/psychological problem caused/exacerbated by use, Continued use despite persistent or recurrent social, interpersonal problems, caused or exacerbated by use, Evidence of tolerance, Social, occupational, recreational activities given up or reduced due to use, Substance(s) often taken in large amounts or over longer times than was intended, Persistent desire or unsuccessful efforts to cut down or control use  Social Function:  Social Functioning Social Maturity: Responsible Social Judgement: Normal  Stress:  Stress Stressors: Family conflict Coping Ability: Overwhelmed, Resilient Patient Takes Medications The Way The Doctor Instructed?: Yes Priority Risk: Low Acuity  Risk Assessment- Self-Harm Potential: Risk Assessment For Self-Harm Potential Thoughts of Self-Harm: No current thoughts Method: No plan  Risk Assessment -Dangerous to Others Potential: Risk Assessment For Dangerous to Others Potential Method: No Plan Availability of Means: No access  or NA  DSM5 Diagnoses: Patient Active Problem List   Diagnosis Date Noted  . 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 Degenerative arthritis of finger 09/28/2012    Patient Centered Plan: Patient is on the following Treatment Plan(s): CD-IOP [place] PLAN   Recommendations for Services/Supports/Treatments: Recommendations for Services/Supports/Treatments Recommendations For Services/Supports/Treatments: CD-IOP Intensive Chemical Dependency Program  Treatment Plan Summary:    Referrals to Alternative Service(s): Referred to Alternative Service(s):   Place:   Date:   Time:    Referred to Alternative Service(s):   Place:   Date:   Time:    Referred to Alternative Service(s):   Place:   Date:   Time:    Referred to Alternative Service(s):   Place:   Date:   Time:     Archie Balboa

## 2018-06-05 ENCOUNTER — Inpatient Hospital Stay: Payer: BLUE CROSS/BLUE SHIELD | Admitting: Family Medicine

## 2018-06-05 ENCOUNTER — Other Ambulatory Visit (HOSPITAL_COMMUNITY): Payer: Self-pay

## 2018-06-05 ENCOUNTER — Other Ambulatory Visit (INDEPENDENT_AMBULATORY_CARE_PROVIDER_SITE_OTHER): Payer: BLUE CROSS/BLUE SHIELD | Admitting: Medical

## 2018-06-05 ENCOUNTER — Other Ambulatory Visit (HOSPITAL_COMMUNITY): Payer: Self-pay | Admitting: Medical

## 2018-06-05 ENCOUNTER — Inpatient Hospital Stay: Payer: Self-pay | Admitting: Family Medicine

## 2018-06-05 ENCOUNTER — Encounter (HOSPITAL_COMMUNITY): Payer: Self-pay

## 2018-06-05 ENCOUNTER — Encounter (HOSPITAL_COMMUNITY): Payer: Self-pay | Admitting: Medical

## 2018-06-05 VITALS — BP 123/81 | HR 90 | Ht 67.0 in | Wt 129.0 lb

## 2018-06-05 DIAGNOSIS — F341 Dysthymic disorder: Secondary | ICD-10-CM

## 2018-06-05 DIAGNOSIS — F419 Anxiety disorder, unspecified: Secondary | ICD-10-CM | POA: Diagnosis not present

## 2018-06-05 DIAGNOSIS — Z811 Family history of alcohol abuse and dependence: Secondary | ICD-10-CM

## 2018-06-05 DIAGNOSIS — T50905A Adverse effect of unspecified drugs, medicaments and biological substances, initial encounter: Secondary | ICD-10-CM

## 2018-06-05 DIAGNOSIS — R61 Generalized hyperhidrosis: Secondary | ICD-10-CM

## 2018-06-05 DIAGNOSIS — F1020 Alcohol dependence, uncomplicated: Secondary | ICD-10-CM

## 2018-06-05 DIAGNOSIS — Z79899 Other long term (current) drug therapy: Secondary | ICD-10-CM | POA: Diagnosis not present

## 2018-06-05 DIAGNOSIS — E039 Hypothyroidism, unspecified: Secondary | ICD-10-CM

## 2018-06-05 DIAGNOSIS — F4310 Post-traumatic stress disorder, unspecified: Secondary | ICD-10-CM | POA: Insufficient documentation

## 2018-06-05 DIAGNOSIS — Z6372 Alcoholism and drug addiction in family: Secondary | ICD-10-CM

## 2018-06-05 DIAGNOSIS — F88 Other disorders of psychological development: Secondary | ICD-10-CM

## 2018-06-05 MED ORDER — QUETIAPINE FUMARATE 25 MG PO TABS: 25.0000 mg | ORAL_TABLET | Freq: Every evening | ORAL | 2 refills | Status: DC | PRN

## 2018-06-05 MED ORDER — SERTRALINE HCL 50 MG PO TABS: 50.0000 mg | ORAL_TABLET | Freq: Every day | ORAL | 0 refills | Status: DC

## 2018-06-05 NOTE — Progress Notes (Addendum)
Psychiatric Initial Adult Assessment   Patient Identification: Crystal Mcknight MRN:  [id number] Date of Evaluation:  06/05/2018 Referral Source: [facility] [place] friend;former patient Chief Complaint:   Chief Complaint    Establish Care; Alcohol Problem; Family Problem     Visit Diagnosis:    ICD-10-CM   1. Alcohol use disorder, severe, dependence ([place]) F10.20   2. Family history of alcoholism in father Z66.72   3. Dysthymia (or depressive neurosis) F34.1   4. Chronic anxiety F41.9   5. Delayed emotional development F88    Adult child of alcoholic/dysfunctional family  6. Unexplained night sweats R61   7. Acquired hypothyroidism E03.9   8. PTSD (post-traumatic stress disorder) F43.10   9. Medication side effect, initial encounter T50.905A     History of Present Illness:  43 y/o WF  With history of severe alcohol SUD treated initially 28 day residential care at [facility] ,discharged 05/26/2018 now seeking IOP aftercare for relapase prevention:  Associated Signs/Symptoms:  DSM V SUD CRITERIA-10/11 + = Severe Dependence Alcohol  ASAM's:  Six Dimensions of Multidimensional Assessment Dimension 1:  Acute Intoxication and/or Withdrawal Potential: minimal    Dimension 2:  Biomedical Conditions and Complications:  mild  Dimension 3:  Emotional, Behavioral, or Cognitive Conditions and Complications:   receiving adequate BH services  Dimension 4:  Readiness to Change: Requires support in stages of change    Dimension 5:  Relapse, Continued use, or Continued Problem Potential:  High risk   Dimension 6:  Recovery/Living Environment:   Husband /children angry-problem with neighbors   Audit:Almost certainly dependent/+ Question 10  Depression Symptoms: depressed mood,1 fatigue,1 feelings of worthlessness/guilt,1 PHQ 9 score 3  (Hypo) Manic Symptoms:   Sexually Inapproprite Behavior,   Anxiety Symptoms:  GAD 7 Score 4 "Somewhat Difficult"   Psychotic Symptoms:   None  PTSD Symptoms: Had a traumatic exposure:  Childhood in dysfunctional alcoholic family Had a traumatic exposure in the last month:  Sitting with close friend who is having spontaneous abortion in bed ta home waiting for live fetusto die before surgery.Another close friendis dying of Stage iv breat cancer and her husband whomis building [place] [facility] for them is in Neuro ICU with collapse  Re-experiencing:  ACOA syndrome a la Myrene Buddy EDD Hypervigilance:  Yes Hyperarousal:  Difficulty Concentrating Sleep Avoidance:  Alcohol addiction;relationship  Past Psychiatric History: 25 years of Zoloft 25 mg  Previous Psychotropic Medications: Zoloft  Substance Abuse History in the last 12 months:   Substance Abuse History in the last 12 months: Substance Age of 1st Use Last Use Amount Specific Type  Nicotine Never     Alcohol 11 04/27/2018 6-10 Wine/Vodka  Cannabis 11 04/26/2018 In cookies THC-sister works on Marijuana farm and uses daily  Opiates      Cocaine      Methamphetamines      LSD  College    Ecstasy      Benzodiazepines      Caffeine      Inhalants      Others:                          Consequences of Substance Abuse: Medical Consequences:  [facility]rium detox/ Legal Consequences:  None Family Consequences:  Stressed marriage Blackouts:  Yes DT's: NO Withdrawal Symptoms:   CIWA > 10  rx Librium WD protocol  Past Medical History:  Past Medical History:  Diagnosis Date  . Arthritis    hands  .  Depression   . Hypothyroidism   . Pelvic pain   . PONV (postoperative nausea and vomiting)   . SVD (spontaneous vaginal delivery)    x 2  . Thyroid disease     Past Surgical History:  Procedure Laterality Date  . APPENDECTOMY    . LAPAROSCOPY N/A 05/31/2013   Procedure: LAPAROSCOPY OPERATIVE, FULGURATION OF ENDOMETRIOSIS, REMOVAL OF BILATERAL FALLOPIAN TUBE CYST;  Surgeon: Marylynn Pearson, MD;  Location: [facility];  Service: Gynecology;  Laterality: N/A;  . RHINOPLASTY     . WISDOM TOOTH EXTRACTION      Family Psychiatric History:  F-alcoholic -sober 57 yrs S-daily Pot user 2  Step brothers- behavioral and addiction isssues/1 deceased from alcoholic liver disease                               01/2018   Family History:  Family History  Problem Relation Age of Onset  . Cancer Maternal Grandmother   . Heart disease Maternal Grandfather   . Cancer Paternal Grandmother   . Heart disease Paternal Grandfather   . Cancer Mother   . Hyperlipidemia Mother   . Hyperlipidemia Father   . Hypertension Father     Social History:   Social History   Socioeconomic History  . Marital status: Married    Spouse name: Lennette Bihari  . Number of children: 2  Addie 13;Porter 10  . Years of education: Last Grade Completed: 54 Name of High School: HS in [place] Did You Graduate From [employer]?: Yes Did [place]?: Yes What Type of College Degree Do you Have?: BA in Sociology and 2nd BA in Nursing Did [place]?: No What Was Your Major?: Women's Studies, Conservation officer, nature, Nursing Did You Have Any Difficulty At School?: No   . Highest education level: BA Nursing  Occupational History  . 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 Financial resource strain: No  . Food insecurity:    Worry: No    Inability: No  . Transportation needs:    Medical: No    Non-medical: No  Tobacco Use  . Smoking status: Never Smoker  . Smokeless tobacco: Never Used  Substance and Sexual Activity  . Alcohol use: Yes    Alcohol/week: 7.0 - 14.0 standard drinks    Types: 7 - 14 Glasses of wine per week    Comment: 1-2 glasses  wind daily  . Drug use: No  . Sexual activity: Yes    Birth control/protection: None    Comment: husband vasectomy  Lifestyle  . Physical activity:Exercise/Diet Do You Exercise?: Yes What Type of Exercise Do You Do?: Run/Walk How Many Times a Week Do You Exercise?: 1-3 times a week Have You Gained or Lost A Significant Amount of Weight in the Past Six Months?: No Do You Follow a Special Diet?: No Do You Have Any Trouble Sleeping?: Yes Explanation of Sleeping Difficulties: Currently taking Trazadone for restlessness     Days per week:  1-3 times a week    Minutes per session: Not on file  . Stress: Stressors:  Stressors: Family conflict  Coping Ability:  Coping Ability: Overwhelmed, Resilient     Relationships  . Social connections:Family history Marital status: Married Number of Years Married: 51 What types of issues is patient dealing with in the relationship?: Husband is "an ass sometimes; He's fed up w/ my drinking." What is your sexual orientation?: heterosexual Does patient have children?: Yes How many children?: 2 How is patient's relationship with their children?: Good, "They never missed out on my love or attention when I was drinking."     Talks on phone: Not on file    Gets together: Not on file    Attends religious service: Religion/Spirituality Are You A Religious Person?: No How Might This Affect Treatment?: "I'm spiritual"     Active member of club or organization: Not on file    Attends meetings of clubs or organizations: Not on file    Relationship status: Not on file  Other Topics Concern  .  Childhood History By whom was/is the patient raised?: Both parents, Mother/father and step-parent Additional childhood history information: Biological parents divorced before pt remembers, pt grew up mostly w/ Mother/Stepfather and 2 step brothers Description of patient's relationship with caregiver when they were a child: "I had to be the good child because my  brothers were horrible influences and caused lots of stress to our family." Patient's description of current relationship with people who raised him/her: "I've been helping and supporting my parents this past year since we lost my brother in 2018-03-13. My prarents grieved heavily. They're pretty strick on my and getting sober now." Does patient have siblings?: Yes Number of Siblings: 2 Description of patient's current relationship with siblings: 2 Older step brothers, calls them "brothers", one died in 03-13-18 of alcohol-related issues, one is still alive but is not very close to PT. Did patient suffer any verbal/emotional/physical/sexual abuse as a child?: No Did patient suffer from severe childhood neglect?: No Has patient ever been sexually abused/assaulted/raped as an adolescent or adult?: No Was the patient ever a victim of a crime or a disaster?: No Witnessed domestic violence?: No(No physical violence, but feels that her brothers' behavioral problems may have traumatized her) Has patient been effected by domestic violence as an adult?: No  Are There Guns or Other Weapons in [place]?: Yes Types of Guns/Weapons: Comptroller Are These [place]?: Yes    Social History Narrative  . Referred by friend to start CD-IOP for alcoholism, daily alcohol abuser for last 2 years, daily drinker for last 24-Aug-2022. Grief, death and loss in family in 09-10-2017. Recent D/C from [place] on 05/26/18, successfully completed and wants to build on success there.     Allergies:   Allergies  Allergen Reactions  . Penicillins Rash    When she was 43years old, had a rash when took this for strep throat    Metabolic Disorder Labs: Lab Results  Component Value Date   HGBA1C 5.0 03/18/2016   No results found for: PROLACTIN No results found for: CHOL, TRIG, HDL, CHOLHDL, VLDL, LDLCALC Lab Results  Component Value Date   TSH 0.10 (L) 03/18/2016    Current  Medications: Current Outpatient Medications  Medication Sig Dispense Refill  . Multiple Vitamin (MULTIVITAMIN WITH MINERALS) TABS tablet Take 1 tablet by mouth daily.    Marland Kitchen SYNTHROID 175 MCG tablet     . triamcinolone cream (KENALOG) 0.1 % Apply 1 application topically 2 (two) times daily. 30 g 0  . Vitamins/Minerals TABS Take by mouth.    Marland Kitchen  albuterol (VENTOLIN HFA) 108 (90 Base) MCG/ACT inhaler 1-2 inhalations every 4-6 hours as needed for wheezing. Dispense spacer as needed.    Marland Kitchen azithromycin (ZITHROMAX) 250 MG tablet Take 1 tablet (250 mg total) by mouth daily. Take first 2 tablets together, then 1 every day until finished. (Patient not taking: Reported on 06/05/2018) 6 tablet 0  . fluconazole (DIFLUCAN) 150 MG tablet Take 1 tablet (150 mg total) by mouth daily. (Patient not taking: Reported on 06/05/2018) 2 tablet 0  . fluticasone (FLONASE) 50 MCG/ACT nasal spray Place into both nostrils daily.    . hydrOXYzine (ATARAX/VISTARIL) 25 MG tablet Take 0.5-2 tablets (12.5-50 mg total) by mouth every 6 (six) hours as needed for anxiety or itching. 30 tablet 0  . ibuprofen (ADVIL,MOTRIN) 400 MG tablet Take 400 mg by mouth every 6 (six) hours as needed.    Marland Kitchen QUEtiapine (SEROQUEL) 25 MG tablet Take 1 tablet (25 mg total) by mouth at bedtime as needed and may repeat dose one time if needed. 60 tablet 2  . sertraline (ZOLOFT) 50 MG tablet Take 1 tablet (50 mg total) by mouth daily for 30 days. 30 tablet 0   No current facility-administered medications for this visit.     Musculoskeletal: Strength & Muscle Tone: within normal limits Gait & Station: normal Patient leans: N/A  Psychiatric Specialty Exam: Review of Systems  Constitutional: Positive for diaphoresis (began with increase in Zoloft to 50 mg and addition of Trazodone HS) and malaise/fatigue. Negative for chills, fever and weight loss.  HENT: Negative for congestion, ear discharge, ear pain, hearing loss, nosebleeds, sinus pain, sore throat  and tinnitus.   Eyes: Negative for blurred vision, double vision, photophobia, pain, discharge and redness.  Respiratory: Negative for cough, hemoptysis, sputum production, shortness of breath, wheezing and stridor.   Cardiovascular: Negative for chest pain, palpitations, orthopnea, claudication, leg swelling and PND.  Gastrointestinal: Negative for abdominal pain, blood in stool, constipation, diarrhea, heartburn, melena, nausea and vomiting.  Genitourinary: Negative for dysuria, flank pain, frequency, hematuria and urgency.  Musculoskeletal: Negative for back pain, falls, joint pain, myalgias and neck pain.  Skin: Negative for itching and rash.  Neurological: Negative for dizziness, tingling, tremors, sensory change, speech change, focal weakness, seizures, loss of consciousness, weakness and headaches.  Endo/Heme/Allergies: Positive for environmental allergies and polydipsia. Does not bruise/bleed easily.       Hypothyroid  Psychiatric/Behavioral: Positive for depression, memory loss and substance abuse. Negative for hallucinations and suicidal ideas. The patient is nervous/anxious and has insomnia.     Blood pressure 123/81, pulse 90, height 5\' 7"  (1.702 m), weight 129 lb (58.5 kg), SpO2 100 %.Body mass index is 20.2 kg/m.  General Appearance: Fairly Groomed and fatigued  Eye Contact:  Good  Speech:  Clear and Coherent and Normal Rate  Volume:  Normal  Mood:  Dysphoric  Affect:  Congruent  Thought Process:  Coherent and Descriptions of Associations: Intact  Orientation:  Full (Time, Place, and Person)  Thought Content:  Logical and Rumination Denies cravings  Suicidal Thoughts:  No  Homicidal Thoughts:  No  Memory:  Hx of blackouts  Judgement:  Impaired  Insight:  Limited  Psychomotor Activity:  Normal  Concentration:  Concentration: Good and Attention Span: Good  Recall:  See memory  Fund of Knowledge:WDL  Language: Good  Akathisia:  NA  Handed:  Right  AIMS (if indicated):   NA  Assets:  Communication Skills Desire for Improvement Financial Resources/Insurance Housing Resilience Social Support Heritage manager  ADL's:  Intact  Cognition: Impaired,  Moderate  Sleep:  with medication   Screenings: PHQ2-9     Office Visit from 03/18/2016 in Primary Care at [facility] from 07/03/2015 in Primary Care at [facility] from 06/23/2015 in Primary Care at [facility] from 12/23/2014 in Primary Care at [facility] Total Score  0  0  0  0       Treatment Plan/Recommendations:  Plan of Care: SUD /Core issues [facility] CDIOP-see counselor's individualized treatment program  Laboratory:  UDS Thyroid Panel  Psychotherapy:IOP Group;Individual and Family  Medications: see list Rx Seroquel in place of trazodone due to night sweat hxs  Routine PRN Medications:  No  Consultations: None  Safety Concerns:  Return to use  Other:  Pt viewed PET Scans of addicted brains and Video on Baclofen and Cravings with PET scan of craving brain    Darlyne Russian, PA-C 1/13/20206:19 PM

## 2018-06-07 ENCOUNTER — Encounter (HOSPITAL_COMMUNITY): Payer: Self-pay | Admitting: Psychology

## 2018-06-07 ENCOUNTER — Encounter (HOSPITAL_COMMUNITY): Payer: Self-pay

## 2018-06-07 ENCOUNTER — Other Ambulatory Visit (HOSPITAL_COMMUNITY): Payer: BLUE CROSS/BLUE SHIELD | Admitting: Psychology

## 2018-06-07 DIAGNOSIS — Z6372 Alcoholism and drug addiction in family: Secondary | ICD-10-CM

## 2018-06-07 DIAGNOSIS — Z811 Family history of alcohol abuse and dependence: Secondary | ICD-10-CM

## 2018-06-07 DIAGNOSIS — F1020 Alcohol dependence, uncomplicated: Secondary | ICD-10-CM

## 2018-06-07 NOTE — Progress Notes (Signed)
    Daily Group Progress Note  Program: CD-IOP   06/07/2018 Crystal Mcknight [id number]  Diagnosis:  Alcohol use disorder, severe, dependence ([place])  Substance induced mood disorder ([facility])  Stage of Change: Action  Sobriety Date: 12/5  Group Time: 1-2:30  Participation Level: Active  Behavioral Response: Appropriate and Sharing  Type of Therapy: Process Group  Interventions: CBT and Supportive  Topic: Pts were active and engaged in group processing session. Counselor facilitated CBT-based and emotion-focused processing questions to help pts discuss their recovery from mind-altering drugs/alcohol. Emphasis was placed on pts disclosing their challenges and successes pertaining to their treatment plan goals. UDS results were collected from some members       Group Time: 2:30-4  Participation Level: Active  Behavioral Response: Appropriate and Sharing  Type of Therapy: Psycho-education Group  Interventions: CBT and Motivational Interviewing  Topic: Patient were active and engaged in psychoeducation session in which a former group member who is successfully living recovery for 25mo post-group, spoke abou his time after D/C. PTs asked questions and strategized about their furture in recovery and needs they may have. One member graduated successfully, his mother was present for final 15 min of session.      Summary: Pt shared openly and talked significantly about her family conflicts since she has returned to the house upon D/C from [employer]. She reports she is loving her [facility] [place] [facility] meeting which she goes to daily. Pt continues to appear anxious and somewhat disheveled since starting program. She reports she is learning to take care of herself but unfamiliar w/ this process.    UDS collected: Yes Results: negative  AA/NA attended?: YesThursday  Sponsor?: Yes   [name], LPCA LCASA 06/07/2018 12:10 PM

## 2018-06-08 ENCOUNTER — Encounter (HOSPITAL_COMMUNITY): Payer: Self-pay

## 2018-06-08 ENCOUNTER — Encounter (HOSPITAL_COMMUNITY): Payer: Self-pay | Admitting: Psychology

## 2018-06-08 ENCOUNTER — Other Ambulatory Visit (HOSPITAL_COMMUNITY): Payer: BLUE CROSS/BLUE SHIELD | Admitting: Psychology

## 2018-06-08 DIAGNOSIS — F4310 Post-traumatic stress disorder, unspecified: Secondary | ICD-10-CM

## 2018-06-08 DIAGNOSIS — Z6372 Alcoholism and drug addiction in family: Secondary | ICD-10-CM

## 2018-06-08 DIAGNOSIS — F1020 Alcohol dependence, uncomplicated: Secondary | ICD-10-CM

## 2018-06-08 DIAGNOSIS — Z811 Family history of alcohol abuse and dependence: Secondary | ICD-10-CM

## 2018-06-08 NOTE — Progress Notes (Signed)
    Daily Group Progress Note  Program: CD-IOP   06/08/2018 Crystal Mcknight [id number]  Diagnosis: Alcohol Use Disorder, Severe  Sobriety Date:   Group Time: 1-2:30pm  Participation Level: Active  Behavioral Response: Appropriate and Sharing  Type of Therapy: Process Group  Interventions: Supportive  Topic: Process: the first half of group was spent in process. Members shared about any 'shining moments' or 'speed bumps' they had experienced since we last met. a new member was present, and she introduced herself to the group. She was tearful but was received support and encouragement from her new fellow group members. two visitors were present today during the session. The visitors included a PA student from [employer]. and a Careers information officer from [employer]. They participated when appropriate during the session. Three drug tests were collected during group today.  Group Time: 2:30-4pm  Participation Level: Active  Behavioral Response: Sharing  Type of Therapy: Psycho-ed  Interventions: CBT  Topic: Psychoeducation: "The Wheel of Life". The second half of group was spent in a psycho-ed. Members were provided a handout and the counselor led them in an exercise identifying there standing in different aspects of their life. The session proved compelling with members sharing openly about how they had identified their status in each of the categories. There was good support and disclosure among members.    Summary: The patient reported she had attended an [place] meeting this morning. She noted that her parents had expressed interest and attended the meeting with her. They had been well-received and found the meeting very compelling. The patient explained that her father had been reading a lot about recovery and AA and noted, "I think it gets it". Unfortunately, they were headed back to their home in [place] within the day. The patient expressed frustration with her husband, who has a very serious  'control problem'. She provided examples of the things that he does and pointed out that he is 'sort of a martyr'. The patient reported continued frustration with PAWS and the irritability, confusion and cognitive cloudiness that she is experiencing. In the psycho-ed, the patient was able to identify her spiritual and intellectual elements of the wheel as being very satisfying. When asked about the upcoming weekend, the patient noted that her husband is going out of town for work, but had a number of plans and commitments she will follow through with over the weekend. A drug test was collected today. The patient remains sober and she responded well to this intervention.    UDS collected: Yes Results: pending  AA/NA attended: Yes, Thursday morning  Sponsor?: Yes   Brandon Melnick, LCAS 06/08/2018 9:18 PM

## 2018-06-09 ENCOUNTER — Encounter (HOSPITAL_COMMUNITY): Payer: Self-pay | Admitting: Licensed Clinical Social Worker

## 2018-06-09 DIAGNOSIS — F419 Anxiety disorder, unspecified: Secondary | ICD-10-CM

## 2018-06-09 DIAGNOSIS — F1020 Alcohol dependence, uncomplicated: Secondary | ICD-10-CM

## 2018-06-09 LAB — TSH: TSH: 3.89 u[IU]/mL (ref 0.450–4.500)

## 2018-06-12 ENCOUNTER — Encounter (HOSPITAL_COMMUNITY): Payer: Self-pay

## 2018-06-12 ENCOUNTER — Other Ambulatory Visit (HOSPITAL_COMMUNITY): Payer: Self-pay | Admitting: Medical

## 2018-06-12 ENCOUNTER — Other Ambulatory Visit (HOSPITAL_COMMUNITY): Payer: BLUE CROSS/BLUE SHIELD | Admitting: Psychology

## 2018-06-12 ENCOUNTER — Encounter (HOSPITAL_COMMUNITY): Payer: Self-pay | Admitting: Medical

## 2018-06-12 DIAGNOSIS — F4310 Post-traumatic stress disorder, unspecified: Secondary | ICD-10-CM

## 2018-06-12 DIAGNOSIS — F1020 Alcohol dependence, uncomplicated: Secondary | ICD-10-CM

## 2018-06-12 DIAGNOSIS — F419 Anxiety disorder, unspecified: Secondary | ICD-10-CM

## 2018-06-12 DIAGNOSIS — Z811 Family history of alcohol abuse and dependence: Secondary | ICD-10-CM

## 2018-06-12 DIAGNOSIS — F88 Other disorders of psychological development: Secondary | ICD-10-CM

## 2018-06-12 DIAGNOSIS — R61 Generalized hyperhidrosis: Secondary | ICD-10-CM

## 2018-06-12 DIAGNOSIS — Z6372 Alcoholism and drug addiction in family: Secondary | ICD-10-CM

## 2018-06-12 DIAGNOSIS — F341 Dysthymic disorder: Secondary | ICD-10-CM

## 2018-06-12 NOTE — Patient Instructions (Addendum)
sssssssssssssssssssssssssssssssssssssssssssssssssssssssssssssaaaaaaaaaaaaaaaaaaaaaaaaaaaaaaaaaaaaaaaaaaaaaaaaaaaaaaaaaaaaaaaaaaaaaaaaaaaaaaaaaaaaaaaaaaaaaaaaaaaaaaaaaaaaaaaaaaaaaaaaaaaaaaaaaaaaaaaaaaaaaaaaaaaaaaaaaaaaaaaaaaaaaaaaaaaaaaaaaaaaaaaaaaaaaaaaaaaaaaaaaaaaaaaaaaaaaaaaaaaaaaaaaaaaaaaaaaaaaaaaaaaaaaaaaaaaaaaaaaaaaaaaaaaaaaaaaaaaaaaaaaaaaaaaaaaaaaaaaaaaaaaaaaaaaaaaaaaaaaaaaaaaaaaaaaaaaaaaaaaaaaaaaaaaaaaaaaaaaaaaaaaaaaaaaaaaaaaaaaaaaaaaaaaaaaaaaaaaaaaaaaaaaaaaaaaaaaaaaaaaaaaaaaaaaaaaaaaaaaaaaaaaaaaaaaaaaaaaaaaaaaaaaaaaaaaaaaaaaaaaaaaaaaaaaaaaaaaaaaaaaaaaaaaaaaaaaaaaaaaaaaaaaaaaaaaaaaaaaaaaaaaaaaaaaaaaaaaaaaaaaaaaaaaaaaaaaaaaaaaaaaaaaaaaaaaaaaaaaaaaaaaaaaaaaaaaaaaaaaaaaaaaaaaaaaaaaaaaaaaaaaaaaaaaaaaaaaaaaaaaaaaaaaaaaaaaaaaaaaaaaaaaaaaaaaaaaaaaaaaaaaaaaaaaaaaaaaaaaaaaaaaaaaaaaaaaaaaaaaaaaaaaaaaaaaaaaaaaaaaaaaaaaaaaaaaaaaaaaaaaaaaaaaaaaaaaaaaaaaaaaaaaaaaaaaaaaaaaaaaaaaaaaaaaaaaaaaaaaaaaaaaaaaaaaaaaaaaaaaaaaaaaaaaaaaaaaaaaaaaaaaaaaaaaaaaaaaaaaaaaaaaaaaaaaaaaaaaaaaaaaaaaaaaaaaaaaaaaaaaaaaaaaaaaaaaaaaaaaaaaaaaaaaaaaaaaaaaaaaaaaaaaaaaaaaaaaaaaaaaaaaaaaaaaaaaaaaaaaaaaaaaaaaaaaaaaaaaaaaaaaaaaaaaaaaaaaaaaaaaaaaaaaaaaaaaaaaaaaaaaaaaaaaaaaaaaaaaaaaaaaaaaaaaaaaaaaaaaaaaaaaaaaaaaaaaaaaaaaaaaaaaaaaaaaaaaaaaaaaaaaaaaaaaaaaaaaaaaaaaaaaaaaaaaaaaaaaaaaaaaaaaaaaaaaaaaaaaaaaaaaaaaaaaaaaaaaaaaaaaaaaaaaaaaaaaaaaaaaaaaaaaaaaaaaaaaaaaaaaaaaaaaaaaaaaaaaaaaaaaaaaaaaaaaaaaaaaaaaaaa aaaaaaaaaaaaaaaaaaaaaaaaaaaaaaaaaaaaaaaaaaaaaaaaaaaaaaaaaaaaaaaaaaaaaaaaaaaaaaaaaaaaaaaaaaaaaaaaaaaaaaaaaaaaaaaaaaaaaaaaaaaaaaaaaaaaaaaaaaaaaaaaaaaaaaaaaaaaaaaaaaaaaaaaaaaaaaaaaaaaaaaaaaaaaaaaaaaaaaaaaaaaaaaaaaaaaaaaaaaaaaaaaaaaaaaaaaaaaaaaaaaaaaaaaaaaaaaaaaaaaaaaaaaaaaaaaaaaaaaaaaaaaaaaaaaaaaaaaaaaaaaaaaaaaaaaaaaaaaaaaaaaaaaaaaaaaaaaaaaaaaaaaaaaaaaaaaaaaaaaaaaaaaaaaaaaaaaaaaaaaaaaaaaaaaaaaaaaaaaaaaaaaaaaaaaaaaaaaaaaaaaaaaaaaaaaaaaaaaaaaaaaaaaaaaaaaaaaaaaaaaaaaaaaaaaaaaaaaaaaaaaaaaaaaaaaaaaaaaaaaaaaaaaaaaaaaaaaaaaaaaaaaaaaaaaaaaaaaaaaaaaaaaaaaaaaaaaaaaaaaaaaaaaaaaaaaaaaaaaaaaaaaaaaaaaaaaaaaaaaaaaaaaaaaaaaaaaaaaaaaaaaaaaaaaaaaaaaaaaaaaaaaaaaaaaaaaaaaaaaaaaaaaaaaaaaaaaaaaaaaaaaaaaaaaaaaaaaaaaaaaaaaaaaaaaaaaaaaaaaaaaaaaaaaaaaaaaaaaaaaaaaaaaaaaaaaaaaaaaaaaaaaa

## 2018-06-12 NOTE — Progress Notes (Signed)
   [facility] Follow-up Outpatient Visit   Date: 06/12/2018  Admission Date: 06/01/2018  Sobriety date:04-27-2018  Subjective: 'I stopped sweating-I knew it was the Trazodone"  HPI 1 week FU post Initial evaluation. Pt is seen 1 week after INITIAL EVALUATION for FU on c/o diaphoresis with associated insomnia with onset at initiation of Trazodone and increase in Zoloft from 25 to 50 mg while at [employer]. Seroquel was substituted for Trazodone. The night sweats stopped.She reports getting sleep with Seroquel but not as much.She admits to having a lot on her mind as well which may interfere with her sleep?Marland Kitchen Her TSH came back WNL but pt states it is not "normal for her". (I need to be in the 2's)  Review of Systems: Psychiatric: Agitation: No Hallucination: No Depressed Mood: Anxious depression Insomnia: Rx Seroquel Hypersomnia: No Altered Concentration: No Feels Worthless: No Grandiose Ideas: No Belief In Special Powers: No New/Increased Substance Abuse: No Compulsions: currently in remission  Neurologic: Headache: No Seizure: No Paresthesias: No  Current Medications: fluticasone 50 MCG/ACT nasal spray  Commonly known as: FLONASE  Place into both nostrils daily.   ibuprofen 400 MG tablet  Commonly known as: ADVIL,MOTRIN  Take 400 mg by mouth every 6 (six) hours as needed.   multivitamin with minerals Tabs tablet  Take 1 tablet by mouth daily.   QUEtiapine 25 MG tablet  Commonly known as: SEROQUEL  Take 1 tablet (25 mg total) by mouth at bedtime as needed and may repeat dose one time if needed.   sertraline 50 MG tablet  Commonly known as: ZOLOFT  Take 1 tablet (50 mg total) by mouth daily for 30 days.   SYNTHROID 175 MCG tablet  Generic drug: levothyroxine    triamcinolone cream 0.1 %  Commonly known as: KENALOG  Apply 1 application topically 2 (two) times daily.   VENTOLIN HFA 108 (90 Base) MCG/ACT inhaler  Generic drug: albuterol        Mental  Status Examination  Appearance: Alert: Yes Attention: good  Cooperative: Yes Eye Contact: Good Speech: Clear and coherent Psychomotor Activity: Normal Memory/Concentration: Normal/intact Oriented: person, place, time/date and situation Mood: Dysthymia Affect: Appropriate and Congruent Thought Processes and Associations: Coherent and Intact- From initial Eval:Sitting with close friend who is having spontaneous abortion in bed ta home waiting for live fetusto die before surgery.Another close friendis dying of Stage iv breat cancer and her husband whomis building [place] home for them is in Neuro ICU with collapse  Fund of Knowledge: Good Thought Content: WDL/Stressors as noted above Insight: Present Judgement: Good  UDS: Clear  Diagnosis:  0 Alcohol use disorder, severe, dependence (HCC)  0 Family history of alcoholism in father  0 PTSD (post-traumatic stress disorder)  0 Dysthymia (or depressive neurosis)  0 Chronic anxiety  0 Delayed emotional development  0 Unexplained night sweats   Assessment: Nite sweats resolved. Sleep not at goal. Need to recheck full Thyroid Panel  Treatment Plan: per Initial Eval;Adjust Seroquel  1.5 to 2.0 tabs HS  Repeat lab with complete Thyroid study including T3    Darlyne Russian, PA-C

## 2018-06-13 NOTE — Progress Notes (Signed)
    Daily Group Progress Note  Program: CD-IOP   06/13/2018 Crystal Mcknight [id number]  Diagnosis:  Alcohol use disorder, severe, dependence ([place])  Family history of alcoholism in father  PTSD (post-traumatic stress disorder)  Dysthymia (or depressive neurosis)  Chronic anxiety  Delayed emotional development  Unexplained night sweats  Stage of Change: Action  Sobriety Date: 12/5  Group Time: 1-2:30  Participation Level: Active  Behavioral Response: Appropriate and Sharing  Type of Therapy: Process Group  Interventions: CBT  Topic: PTs were active and engaged in group processing session. Counselor facilitated CBT-based and emotion-focused processing questions to help PTs discuss their recovery from mind-altering drugs/alcohol. Emphasis was placed on PTs disclosing their challenges and successes pertaining to their treatment plan goals. Two PTs met w/ Darlyne Russian to discuss medications since starting program last week.     Group Time: 2:30-4  Participation Level: Active  Behavioral Response: Appropriate and Sharing  Type of Therapy: Psycho-education Group  Interventions: CBT  Topic: Patient were active and engaged in psychoeducation session w/ emphasis on teaching a mindfulness exercise and doing a "safety assessment" exercise called "3 Circles". PTs were invited to identify "green, yellow, and red" people, places, and things during their recovery. PTs identified their "danger zones and safe zones" during recovery. PTs also learned a simple breathing exercise of extending their outbreath for longer than their inbreath.     Summary: Pt was attentive, engaged, and talkative in session. She reported attending 5 AA meetings since last session. She continues to call her sponsor and is feeling hopeful. She is sleeping well. She is nervous about her husband going out of town for 3 nights this week. Pt reported on her couples session from last week w/ this Probation officer. Pt  discussed her relationship to her biological father who she is somewhat estranged from. Pt is working on "taking her time w/ things" and giving herself patience.    UDS collected: No Results:   AA/NA attended?: YesMonday, Friday, Saturday and Sunday  Sponsor?: Yes   [name], LPCA LCASA 06/13/2018 1:55 PM

## 2018-06-14 ENCOUNTER — Encounter (HOSPITAL_COMMUNITY): Payer: Self-pay

## 2018-06-14 ENCOUNTER — Other Ambulatory Visit (HOSPITAL_COMMUNITY): Payer: BLUE CROSS/BLUE SHIELD

## 2018-06-14 NOTE — Progress Notes (Signed)
    Daily Group Progress Note  Program: CD-IOP   06/14/2018 Crystal Mcknight [id number]  Diagnosis:  Alcohol use disorder, severe, dependence ([place])  Family history of alcoholism in father   Sobriety Date: 04-27-18  Group Time: 1-2:30pm  Participation Level: Active  Behavioral Response: Appropriate  Type of Therapy: Process Group  Interventions: Supportive  Topic: Process: The first part of group began with process. Group members shared their experiences in early recovery since we last met on Monday. All group members were present. Two drug tests were collected. The Medical Director met with two group members to complete medication checks.       Group Time: 2:30-4pm  Participation Level: Active  Behavioral Response: Appropriate  Type of Therapy: Psycho-education Group  Interventions: Family Systems  Topic: Psycho-ed: Chaplin on "Feelings"- The second half of group was spent in psycho-ed with a group discussion led by the [place] about the importance of feelings. The Chaplin discussed the five primary emotions that each individual feels and the role of these emotions in guiding our behavior. The Chaplin shared about control and learning to evaluate what situations are within our control and what situations are outside of our control. The Chaplin asked group members to share about their experiences with feelings growing up in their family of origin.      Summary: The patient reported that she attended two [place] meetings since we last met on Monday. The patient shared that she attended the 8 am [employer] that she really enjoys. The patient that she has been really tired in the evening and has been experiencing PAWS signs. The patient shared that she is having a hard time "finding words." The patient shared that in one of the meetings, someone shared that they relapsed after being sober for 10 years. The patient shared that this caused her to develop a resentment  towards recovery. The patient has been talking to her sponsor everyday about her friend that is slowly miscarrying. The patient described this as a "dark cloud" hanging over her while she waits for her best friend to officially lose the baby. The patient stated that she started working on step 4 and is beginning her resentment inventory. The patient shared that she is realizing she has a lot of resentments towards her husband. The patient shared that her husband has "control issues" and is trying to do everything for her even though she hasn't asked him to do any of this. The patient's husband then lashes out at her because he is feeling overwhelmed. The patient is trying to give her husband "grace" but it has been very challenging for her. The patient shared about her revelation that it is okay to call her sponsor when she feels like shit, and that actually, that is the best time to call her sponsor. During the psycho-ed portion of group, the Chaplin posed the question, "how was your family with feelings?" The patient responded with, "we didn't talk about them."    UDS collected: No   AA/NA attended?: Yes Tuesday  Sponsor?: Yes   Brandon Melnick, LCAS 06/14/2018 9:00 AM

## 2018-06-14 NOTE — Progress Notes (Signed)
   CD-IOP FAMILY SESSION THERAPIST PROGRESS NOTE  Session Time: 1-2  Participation Level: Active  Behavioral Response: Well GroomedAlertAnxious  Type of Therapy: Family Therapy  Treatment Goals addressed: Coping  Interventions: CBT and Family Systems  Summary: Crystal Mcknight is a 43 y.o. female who presents with Alcohol Use Disorder and anxiety. She presents today for individual CD-IOP therapy session w/ her husband present for a couples session.  Counselor and PT discuss PT's desired outcomes from this meeting. Counselor spends time hearing from both PT and spouse about PT's alcohol hx and recent events since being D/C from [place] this month. PT's spouse is active, talkative, and exhibits controlling behaviors including denying PT's version of stories and using past tense language to talk about current situations. Counselor encouraged spouse to consider staying in "today" w/o bringing up past transgressions.   Suicidal/Homicidal: Nowithout intent/plan  Therapist Response: Counselor used open questions, active listening, and emotional focusing techniques. PT and spouse are currently tense, as expected, though there appears to be chemistry and love for one another despite the emotional wounds caused by alcoholism and codependency.   Plan: Continue in CD-IOP, F/U w/ family session as needed.   Diagnosis:    ICD-10-CM   1. Alcohol use disorder, severe, dependence ([place]) F10.20   2. Chronic anxiety F41.9       Archie Balboa, LCAS-A 06/14/2018

## 2018-06-15 ENCOUNTER — Other Ambulatory Visit (HOSPITAL_BASED_OUTPATIENT_CLINIC_OR_DEPARTMENT_OTHER): Payer: BLUE CROSS/BLUE SHIELD | Admitting: Psychology

## 2018-06-15 ENCOUNTER — Encounter (HOSPITAL_COMMUNITY): Payer: Self-pay

## 2018-06-15 DIAGNOSIS — F1020 Alcohol dependence, uncomplicated: Secondary | ICD-10-CM

## 2018-06-15 NOTE — Progress Notes (Signed)
Pt had to leave at 2PM due to her son being home sick and vomiting.

## 2018-06-15 NOTE — Progress Notes (Signed)
    Daily Group Progress Note  Program: CD-IOP   06/15/2018 Crystal Mcknight [id number]  Diagnosis:  Alcohol use disorder, severe, dependence ([place])  Stage of Change: Action  Sobriety Date: 12/5  Group Time: 1-2:30  Participation Level: Active  Behavioral Response: Appropriate and Sharing  Type of Therapy: Process Group  Interventions: CBT  Topic: PTs were active and engaged in group processing session. Counselor facilitated CBT-based and emotion-focused processing questions to help PTs discuss their recovery from mind-altering drugs/alcohol. Emphasis was placed on PTs disclosing their challenges and successes pertaining to their treatment plan goals.  One PT had to leave early due to child sickness.      Group Time: 2:30-4  Participation Level: None  Behavioral Response: Absent  Type of Therapy: Psycho-education Group  Interventions: CBT  Topic: Patient were active and engaged in psychoeducation session led by Frederich Balding, health educator. She led a didactic presentation on sleep, stress, movement, and diet.     Summary: PT was active and engaged for 1st half of group process but had to leave early due to a sick child. She attended a new AA meeting and enjoyed it since she saw friends from [employer]. PT reported she missed yesterday due to working w/ her sponsor on her 5th step which was "intense". PT admitted this step "left her w/ a lot to think about". PT discussed her family roles while growing up and her feelings of lack of resentment and confusion as to how she ended up being an alcoholic and having 3 siblings who are also addicted to chemicals.    UDS collected: Yes Results: pending  AA/NA attended?: YesThursday  Sponsor?: Yes   [name], LPCA LCASA 06/15/2018 3:20 PM

## 2018-06-19 ENCOUNTER — Other Ambulatory Visit (HOSPITAL_COMMUNITY): Payer: Self-pay | Admitting: Medical

## 2018-06-19 ENCOUNTER — Other Ambulatory Visit (HOSPITAL_COMMUNITY): Payer: BLUE CROSS/BLUE SHIELD | Admitting: Psychology

## 2018-06-19 ENCOUNTER — Other Ambulatory Visit (HOSPITAL_COMMUNITY): Payer: Self-pay

## 2018-06-19 ENCOUNTER — Encounter (HOSPITAL_COMMUNITY): Payer: Self-pay

## 2018-06-19 ENCOUNTER — Encounter (HOSPITAL_COMMUNITY): Payer: Self-pay | Admitting: Medical

## 2018-06-19 DIAGNOSIS — F1020 Alcohol dependence, uncomplicated: Secondary | ICD-10-CM

## 2018-06-19 DIAGNOSIS — F341 Dysthymic disorder: Secondary | ICD-10-CM

## 2018-06-19 DIAGNOSIS — F4310 Post-traumatic stress disorder, unspecified: Secondary | ICD-10-CM

## 2018-06-19 DIAGNOSIS — Z6372 Alcoholism and drug addiction in family: Secondary | ICD-10-CM

## 2018-06-19 DIAGNOSIS — Z811 Family history of alcohol abuse and dependence: Secondary | ICD-10-CM

## 2018-06-19 DIAGNOSIS — E039 Hypothyroidism, unspecified: Secondary | ICD-10-CM

## 2018-06-19 DIAGNOSIS — T50905A Adverse effect of unspecified drugs, medicaments and biological substances, initial encounter: Secondary | ICD-10-CM

## 2018-06-19 NOTE — Progress Notes (Signed)
[facility] Follow-up Outpatient Visit   Date: 06/19/2018  Admission Date:06/01/2018  Sobriety date: 04/29/2019  Subjective: "I'm tired"  HPI : Provider Fu Thyroid studies Pt had TSH drawn that was WNL by lab but according to her was"too high.(TSH 3.890  Ref 0.450-4.500 uIU/ml) I need to be in the 2's.I've been seeing an Endocrinologist for 14 years".    Review of Systems: Psychiatric: Agitation: No Hallucination: No Depressed Mood: PHQ 9 06/12/2018 Score 5 Insomnia: Diaphoresis on Trazodone-able to sleep without swaeing on Seroquel ?quality-"Might" have been better on Trazodone? But kept waking  up Hypersomnia: No Altered Concentration: No Feels Worthless: No Grandiose Ideas: No Belief In Special Powers: No New/Increased Substance Abuse: No Compulsions: Lifelong habit of being the "hero" in her alcoholic family/caretaker  Neurologic: Headache: No Seizure: No Paresthesias: No  Current Medications:                                                                                                                         TABS tablet Take 1 tablet by mouth daily.     Marland Kitchen SYNTHROID 175 MCG tablet     . triamcinolone cream (KENALOG) 0.1 % Apply 1 application topically 2 (two) times daily. 30 g 0  . Vitamins/Minerals TABS Take by mouth.    Marland Kitchen albuterol (VENTOLIN HFA) 108 (90 Base) MCG/ACT inhaler 1-2 inhalations every 4-6 hours as needed for wheezing. Dispense spacer as needed.    Marland Kitchen azithromycin (ZITHROMAX) 250 MG tablet Take 1 tablet (250 mg total) by mouth daily. Take first 2 tablets together, then 1 every day until finished. (Patient not taking: Reported on 06/05/2018) 6 tablet 0  . fluconazole (DIFLUCAN) 150 MG tablet Take 1 tablet (150 mg total) by mouth daily. (Patient not taking: Reported on 06/05/2018) 2 tablet 0  . fluticasone (FLONASE) 50 MCG/ACT nasal spray Place into both nostrils daily.    . hydrOXYzine (ATARAX/VISTARIL) 25 MG tablet Take 0.5-2 tablets  (12.5-50 mg total) by mouth every 6 (six) hours as needed for anxiety or itching. 30 tablet 0  . ibuprofen (ADVIL,MOTRIN) 400 MG tablet Take 400 mg by mouth every 6 (six) hours as needed.    Marland Kitchen QUEtiapine (SEROQUEL) 25 MG tablet Take 1 tablet (25 mg total) by mouth at bedtime as needed and may repeat dose one time if needed. 60 tablet 2  . sertraline (ZOLOFT) 50 MG tablet Take 1 tablet (50 mg total      Mental Status Examination  Appearance: Alert: Yes Attention: good  Cooperative: Yes Eye Contact: Good Speech: Clear and coherent Psychomotor Activity: Normal Memory/Concentration: Normal/intact Oriented: person, place, time/date and situation Mood: Euthymic Affect: Appropriate and Congruent Thought Processes and Associations: Coherent and Intact Fund of Knowledge: Good Thought Content: WDL Insight: Good Judgement: Good  TGG:YIRSW  Assessment:  Early remission chronic alcoholism.C/O fatigue. Hx of Hypothyroidism  Diagnosis:  Alcohol use disorder, severe, dependence ([place]) F10.20    2. Family history of alcoholism in father Z23.72   3.  Dysthymia (or depressive neurosis) F34.1   4. Chronic anxiety F41.9   5. Delayed emotional development F88    Adult child of alcoholic/dysfunctional family  6. Unexplained night sweats R61   7. Acquired hypothyroidism E03.9   8. PTSD (post-traumatic stress disorder) F43.10   9. Medication side effect, initial encounter      Treatment Plan: Per Initial Evaluation. Check Thyroid panel and T3 FU WHEN BACK    Darlyne Russian, PA-C

## 2018-06-20 ENCOUNTER — Encounter (HOSPITAL_COMMUNITY): Payer: Self-pay | Admitting: Psychology

## 2018-06-20 LAB — T3: T3 TOTAL: 96 ng/dL (ref 71–180)

## 2018-06-20 NOTE — Progress Notes (Signed)
    Daily Group Progress Note  Program: CD-IOP   06/20/2018 Crystal Mcknight [id number]  Diagnosis:  Alcohol use disorder, severe, dependence ([place])  Family history of alcoholism in father  PTSD (post-traumatic stress disorder)  Dysthymia (or depressive neurosis)  Acquired hypothyroidism  Medication side effect, initial encounter   Sobriety Date: 04/27/18  Group Time: 1-2:30pm  Participation Level: Active  Behavioral Response: Sharing  Type of Therapy: Process Group  Interventions: Supportive  Topic: Process: The first half of group was spent in process. Members shared about the past weekend and identified any 'shining moments' or 'speed bumps' in their recovery. The disclosures included step work and attending many meetings. No one 'slipped' over the weekend and no one reported experienced cravings. The medical director met with three group members during group today. Two drug tests were collected and one member was absent.   Group Time: 2:30-4pm  Participation Level: Minimal  Behavioral Response: Sharing  Type of Therapy: Psycho-education Group  Interventions: Family Systems  Topic: Psycho-Education: Poem "I am from" and Family Roles in Dysfunctional Families. The second half of group was spent in a psycho-ed. Members were provided a partially completed poem and they were asked to "fill-in the blanks". The poem addressed products, smells, family rituals and traditions in their childhoods. Not everyone read them, but they all had painful aspects in their lives. Upon completion, members received another handout identifying the five most common family roles identified in dysfunctional families. They took turns reading from each description and discussing if they could identify this role in their families. Some appeared visibly upset and were not willing to share.   Summary: The patient apologized for having to leave group early last Thursday. Her son had been nauseous  and she had to go get him at school. Her was much improved by the end of Friday. She reported she has been working on 'step stuff' and expressed frustration over her sponsor wanting her to go back over Steps 4 and 5. "My sponsor says I need to have more feelings, more resentments", she reported. The patient explained, "I am very literal", which she suggested was making it difficult to complete the steps in the manner her sponsor wants. When asked about an update on her emotional lability, the patient admitted she continues to experience mood swings and lability. Her [place] plans [place]. She intends to clear the paths with the chain saw she got for Christmas. The patient met briefly with the medical director this afternoon for a med check. In the psycho-ed, the patient was attentive, but shared little of herself. When asked if she would share her poem, "I am from", she declined. At one point, she wiped some tears from her eyes. In the discussion on family roles, the patient did not identify any roles she might have played in her family of origin, but quickly pointed out that her husband is the 'mascot'. He makes jokes under all kinds of circumstances. The patient was distant and is seems clear she was experiencing some emotions that these exercises had generated and was afraid to shared more of herself. We will continue to invite her to share her feelings and help her understand the liberation comes with telling of these feelings.    UDS collected: No   AA/NA attended?: YesMonday, Friday, Saturday and Sunday  Sponsor?: Yes   Brandon Melnick, LCAS 06/20/2018 9:29 AM

## 2018-06-21 ENCOUNTER — Encounter (HOSPITAL_COMMUNITY): Payer: Self-pay

## 2018-06-21 ENCOUNTER — Other Ambulatory Visit (HOSPITAL_COMMUNITY): Payer: BLUE CROSS/BLUE SHIELD | Admitting: Psychology

## 2018-06-21 DIAGNOSIS — Z811 Family history of alcohol abuse and dependence: Secondary | ICD-10-CM

## 2018-06-21 DIAGNOSIS — Z6372 Alcoholism and drug addiction in family: Secondary | ICD-10-CM

## 2018-06-21 DIAGNOSIS — F4310 Post-traumatic stress disorder, unspecified: Secondary | ICD-10-CM

## 2018-06-21 DIAGNOSIS — F1020 Alcohol dependence, uncomplicated: Secondary | ICD-10-CM

## 2018-06-21 LAB — THYROID PANEL
Free Thyroxine Index: 2.2 (ref 1.2–4.9)
T3 UPTAKE RATIO: 27 % (ref 24–39)
T4 TOTAL: 8.1 ug/dL (ref 4.5–12.0)

## 2018-06-21 LAB — SPECIMEN STATUS REPORT

## 2018-06-22 ENCOUNTER — Encounter (HOSPITAL_COMMUNITY): Payer: Self-pay

## 2018-06-22 ENCOUNTER — Other Ambulatory Visit (HOSPITAL_COMMUNITY): Payer: BLUE CROSS/BLUE SHIELD | Admitting: Psychology

## 2018-06-22 DIAGNOSIS — F1020 Alcohol dependence, uncomplicated: Secondary | ICD-10-CM | POA: Diagnosis not present

## 2018-06-23 ENCOUNTER — Encounter (HOSPITAL_COMMUNITY): Payer: Self-pay | Admitting: Licensed Clinical Social Worker

## 2018-06-23 ENCOUNTER — Encounter (HOSPITAL_COMMUNITY): Payer: Self-pay | Admitting: Psychology

## 2018-06-23 DIAGNOSIS — F1020 Alcohol dependence, uncomplicated: Secondary | ICD-10-CM

## 2018-06-23 NOTE — Progress Notes (Signed)
    Daily Group Progress Note  Program: CD-IOP   06/23/2018 [name] [id number]  Diagnosis:  Alcohol use disorder, severe, dependence ([place])  Stage of Change: Action  Sobriety Date: 12/5  Group Time: 1-2:30  Participation Level: Active  Behavioral Response: Appropriate and Sharing  Type of Therapy: Process Group  Interventions: CBT  Topic: PTs were active and engaged in group processing session. Counselor facilitated CBT-based and emotion-focused processing questions to help PTs discuss their recovery from mind-altering drugs/alcohol. Emphasis was placed on PTs disclosing their challenges and successes pertaining to their treatment plan goals.     Counselor facilitated topical discussion using recovery-based topics during PT check-ins. PTs responded well to this and talked openly w/ each other.      Group Time: 2:30-4  Participation Level: Active  Behavioral Response: Appropriate and Sharing  Type of Therapy: Psycho-education Group  Interventions: Meditation: Guided Imagery  Topic: Patient were active and engaged in psychoeducation session in which counselor led a mindfulness guided imagery exercise. PTs were instructed to relax their body, focus on breath, and listen to narration of varying seasons and adjusting to change. PTs had various reactions to this, all stated they were able to attempt to follow instructions.     Summary: PT was open, engaged, and upbeat in group. She discussed her topic of "excuses" and verbalized some recent insights she is gaining into her codependent bxs, her dysfunctional childhood, and grief from an extreme amount of death in her family and social circles. PT feels she is "trying to blame feelings on her childhood that just aren't there". PT participated actively in relaxation guided meditation. PT [place]/ her family and is looking forward to it. It will be first time she has not drank at this  house.    UDS collected: Yes Results: Most recent 1/26, negative  AA/NA attended?: YesThursday  Sponsor?: Yes   [name], LPCA LCASA 06/23/2018 11:49 AM

## 2018-06-23 NOTE — Progress Notes (Signed)
  CD-IOP WEEKLY INDIVIDUAL THERAPY SESSION THERAPIST PROGRESS NOTE  Session Time: 10-11  Participation Level: Active  Behavioral Response: Well GroomedAlertEuthymic  Type of Therapy: Individual Therapy  Treatment Goals addressed: Diagnosis: Alcohol Use Disorder  Interventions: CBT and Supportive  Summary: Crystal Mcknight is a 43 y.o. female who presents with Alcohol Use Disorder, Severe. She is talkative, engaged, and upbeat in session. She reports on her relationship w/ her husband and the strain her dysfunction and addiction caused their marriage. PT continues to report she is gaining clarity on how her childhood impacted her codependency and how she feels responsible for others. She is also feeling somewhat content w/ her recovery, thus far, and is working on trying to be "aware that she does not know what she does not know". PT wants reassurance from counselor that she is ok for not dealing w/ "all her issues" at once.   Suicidal/Homicidal: Nowithout intent/plan  Therapist Response: Counselor used open questions, active listening, and reflection. Counselor helped PT to identify her core beliefs, and challenge negative thinking. Counselor encouraged PT in her continued sobriety. Counselor discussed upcoming plans for a sober weekend,   Plan: Continue in CD-IOP for 6 weeks.  Diagnosis:    ICD-10-CM   1. Alcohol use disorder, severe, dependence ([place]) F10.20       Archie Balboa, LCAS-A 06/23/2018

## 2018-06-26 ENCOUNTER — Other Ambulatory Visit (HOSPITAL_COMMUNITY): Payer: BLUE CROSS/BLUE SHIELD | Attending: Psychiatry | Admitting: Psychology

## 2018-06-26 ENCOUNTER — Encounter (HOSPITAL_COMMUNITY): Payer: Self-pay

## 2018-06-26 DIAGNOSIS — F1020 Alcohol dependence, uncomplicated: Secondary | ICD-10-CM

## 2018-06-26 DIAGNOSIS — F88 Other disorders of psychological development: Secondary | ICD-10-CM | POA: Insufficient documentation

## 2018-06-26 DIAGNOSIS — F341 Dysthymic disorder: Secondary | ICD-10-CM | POA: Insufficient documentation

## 2018-06-26 DIAGNOSIS — R61 Generalized hyperhidrosis: Secondary | ICD-10-CM | POA: Diagnosis not present

## 2018-06-26 DIAGNOSIS — F419 Anxiety disorder, unspecified: Secondary | ICD-10-CM

## 2018-06-26 DIAGNOSIS — E039 Hypothyroidism, unspecified: Secondary | ICD-10-CM | POA: Diagnosis not present

## 2018-06-26 DIAGNOSIS — Z6372 Alcoholism and drug addiction in family: Secondary | ICD-10-CM | POA: Diagnosis not present

## 2018-06-26 DIAGNOSIS — Z79899 Other long term (current) drug therapy: Secondary | ICD-10-CM | POA: Diagnosis not present

## 2018-06-26 DIAGNOSIS — F4310 Post-traumatic stress disorder, unspecified: Secondary | ICD-10-CM | POA: Insufficient documentation

## 2018-06-27 ENCOUNTER — Encounter: Payer: Self-pay | Admitting: Family Medicine

## 2018-06-27 ENCOUNTER — Encounter (HOSPITAL_COMMUNITY): Payer: Self-pay | Admitting: Psychology

## 2018-06-27 ENCOUNTER — Ambulatory Visit (INDEPENDENT_AMBULATORY_CARE_PROVIDER_SITE_OTHER): Payer: BLUE CROSS/BLUE SHIELD | Admitting: Family Medicine

## 2018-06-27 ENCOUNTER — Other Ambulatory Visit: Payer: Self-pay

## 2018-06-27 ENCOUNTER — Ambulatory Visit (INDEPENDENT_AMBULATORY_CARE_PROVIDER_SITE_OTHER): Payer: BLUE CROSS/BLUE SHIELD

## 2018-06-27 VITALS — BP 110/71 | HR 85 | Temp 98.7°F | Resp 14 | Ht 67.0 in | Wt 125.8 lb

## 2018-06-27 DIAGNOSIS — M79641 Pain in right hand: Secondary | ICD-10-CM

## 2018-06-27 DIAGNOSIS — S6000XA Contusion of unspecified finger without damage to nail, initial encounter: Secondary | ICD-10-CM | POA: Diagnosis not present

## 2018-06-27 DIAGNOSIS — Z23 Encounter for immunization: Secondary | ICD-10-CM

## 2018-06-27 DIAGNOSIS — M7989 Other specified soft tissue disorders: Secondary | ICD-10-CM | POA: Diagnosis not present

## 2018-06-27 NOTE — Progress Notes (Signed)
Subjective:    Patient ID: Crystal Mcknight, female    DOB: 1975-12-27, 43 y.o.   MRN: [id number]  HPI Crystal Mcknight is a 43 y.o. female Presents today for: Chief Complaint  Patient presents with  . Fall    in Jan 2020 injury to right hand index, middle and ring finger. Thought was going to get better but still hurts   Hurt R hand - hit ground about 3 weeks ago. Wrestling with dog. Back of hand hit hardwood. Sore initially.  Some swelling in PCP of 2nd -4th fingers. Improved some but still swollen and sore. Movement intact, sore to touch, not bending it.   R hand dominant.   Patient Active Problem List   Diagnosis Date Noted  . Hypothyroidism 06/05/2018  . Unexplained night sweats 06/05/2018  . PTSD (post-traumatic stress disorder) 06/05/2018  . 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 Degenerative arthritis of finger 09/28/2012   Past Medical History:  Diagnosis Date  . Arthritis    hands  . Depression   . Hypothyroidism   . Pelvic pain   . PONV (postoperative nausea and vomiting)   . SVD (spontaneous vaginal delivery)    x 2  . Thyroid disease    Past Surgical History:  Procedure Laterality Date  . APPENDECTOMY    . LAPAROSCOPY N/A 05/31/2013   Procedure: LAPAROSCOPY OPERATIVE, FULGURATION OF ENDOMETRIOSIS, REMOVAL OF BILATERAL FALLOPIAN TUBE CYST;  Surgeon: Marylynn Pearson, MD;  Location: [facility];  Service: Gynecology;  Laterality: N/A;  . RHINOPLASTY    . WISDOM TOOTH EXTRACTION     Allergies  Allergen Reactions  . Penicillins Rash    When she was 43years old, had a rash when took this for strep throat   Prior to Admission medications   Medication Sig Start Date End Date Taking? Authorizing Provider  fluticasone (FLONASE) 50 MCG/ACT nasal spray Place into both nostrils daily.   Yes [provider]  Multiple Vitamin (MULTIVITAMIN WITH MINERALS) TABS tablet Take 1 tablet by mouth daily.   Yes [provider]  sertraline (ZOLOFT) 50 MG tablet Take 1 tablet  (50 mg total) by mouth daily for 30 days. 06/05/18 07/05/18 Yes Dara Hoyer, PA-C  SYNTHROID 175 MCG tablet  01/17/18  Yes [provider]   Social History   Socioeconomic History  . Marital status: Married    Spouse name: Not on file  . Number of children: Not on file  . Years of education: Not on file  . Highest education level: Not on file  Occupational History  . Not on file  Social Needs  . Financial resource strain: Not on file  . Food insecurity:    Worry: Not on file    Inability: Not on file  . Transportation needs:    Medical: Not on file    Non-medical: Not on file  Tobacco Use  . Smoking status: Never Smoker  . Smokeless tobacco: Never Used  Substance and Sexual Activity  . Alcohol use: Yes    Alcohol/week: 7.0 - 14.0 standard drinks    Types: 7 - 14 Glasses of wine per week    Comment: 1-2 glasses wind daily  . Drug use: No  . Sexual activity: Yes    Birth control/protection: None    Comment: husband vasectomy  Lifestyle  . Physical activity:    Days per week: Not on file    Minutes per session: Not on file  . Stress: Not on file  Relationships  . Social connections:  Talks on phone: Not on file    Gets together: Not on file    Attends religious service: Not on file    Active member of club or organization: Not on file    Attends meetings of clubs or organizations: Not on file    Relationship status: Not on file  . Intimate partner violence:    Fear of current or ex partner: Not on file    Emotionally abused: Not on file    Physically abused: Not on file    Forced sexual activity: Not on file  Other Topics Concern  . Not on file  Social History Narrative  . Not on file    Review of Systems  Per HPI.      Objective:   Physical Exam Constitutional:      General: She is not in acute distress.    Appearance: She is well-developed.  HENT:     Head: Normocephalic and atraumatic.  Cardiovascular:     Rate and Rhythm: Normal rate.   Pulmonary:     Effort: Pulmonary effort is normal.  Musculoskeletal:     Right hand: She exhibits tenderness, bony tenderness and swelling. She exhibits normal range of motion. Normal strength (Fourth phalanx nontender.  Third phalanx primarily tender at dorsal aspect of proximal phalanx, diffusely at the PIP where there is soft tissue swelling, minimal tenderness over middle phalanx.  Second phalanx with slight tenderness at the ulnar aspect of ) noted.  Neurological:     Mental Status: She is alert and oriented to person, place, and time.    Vitals:   06/27/18 1625  BP: 110/71  Pulse: 85  Resp: 14  Temp: 98.7 F (37.1 C)  TempSrc: Oral  SpO2: 98%  Weight: 125 lb 12.8 oz (57.1 kg)  Height: 5\' 7"  (1.702 m)    Dg Hand 2 View Right  Result Date: 06/27/2018 CLINICAL DATA:  Right hand pain. EXAM: RIGHT HAND - 2 VIEW COMPARISON:  None. FINDINGS: The mineralization and alignment are normal. There is no evidence of acute fracture or dislocation. The joint spaces appear preserved. Mild angulation at the 5th DIP joint appears developmental. There is some soft tissue swelling around the interphalangeal joints, greatest at the 3rd PIP joint. No erosive changes are identified. IMPRESSION: Mild soft tissue swelling around the interphalangeal joints could be a manifestation of early arthritis. No joint space narrowing, erosive changes or acute osseous findings. Electronically Signed   By: Richardean Sale M.D.   On: 06/27/2018 16:51      Assessment & Plan:   Crystal Mcknight is a 43 y.o. female Hand pain, right - Plan: DG Hand 2 View Right  Need for prophylactic vaccination with combined diphtheria-tetanus-pertussis (DTP) vaccine - Plan: Tdap vaccine greater than or equal to 7yo IM  Contusion of finger of right hand, unspecified finger, initial encounter  Prior contusion without apparent fractures on x-ray.  Full strength, full range of motion.  Suspect this will continue to improve and soft  tissue swelling should be improving as well next few weeks.  RTC precautions if worsening, or can refer her to hand specialist if needed. Activity modification discussed  tdap updated  No orders of the defined types were placed in this encounter.  Patient Instructions   X-ray looked okay.  I expect the swelling and discomfort to improve in the next few weeks.  If any continued issues in 3 to 4 weeks I am happy to refer you to hand specialist, sooner if  worsening.  Hand Contusion A hand contusion is a deep bruise to the hand. Contusions are the result of a blunt injury to tissues and muscle fibers under the skin. The injury causes bleeding under the skin. The skin overlying the contusion may turn blue, purple, or yellow. Minor injuries will give you a painless contusion, but more severe contusions may stay painful and swollen for a few weeks. What are the causes? A contusion is usually caused by a hard hit, trauma, or direct force to your hand, such as having a heavy object fall on your hand. What are the signs or symptoms? Symptoms of this condition include:  Swelling of the hand.  Pain and tenderness of the hand.  Discoloration of the hand. The area may have redness and then turn blue, purple, or yellow. How is this diagnosed? This condition is diagnosed from a physical exam and your medical history. An X-ray may be needed to see if there are any other injuries, such as broken bones (fractures). Sometimes, a CT scan or MRI may be needed if your health care provider is concerned that you may have torn or injured ligaments. How is this treated? An elastic wrap may be recommended to support your hand. In general, the best treatment for a hand contusion is rest, ice, pressure (compression), and elevation of the injured area. This is often called RICE therapy. Over-the-counter medicines may also be recommended for pain control. Follow these instructions at home: [place] the  injured area.  If directed, apply ice to the injured area: ? Put ice in a plastic bag. ? Place a towel between your skin and the bag. ? Leave the ice on for 20 minutes, 2-3 times a day.  If directed, apply light compression to the injured area using an elastic wrap. Make sure the wrap is not too tight. Remove and reapply the wrap as told by your health care provider. If your fingers become numb, cold, or blue, take the wrap off and reapply it more loosely.  Raise (elevate) the injured area above the level of your heart while you are sitting or lying down. General instructions   Take over-the-counter and prescription medicines only as told by your health care provider.  Protect your hand from getting injured further.  Keep all follow-up visits as told by your health care provider. This is important. Contact a health care provider if:  Your symptoms do not improve after several days of treatment.  You have increased redness, swelling, or pain in your hand or fingers.  You have difficulty moving the injured area.  Your swelling or pain is not relieved with medicines. Get help right away if:  You have severe pain.  Your hand or fingers become numb.  Your hand or fingers turn pale, blue, or cold.  You cannot move your hand or wrist.  Your hand is warm to the touch. This information is not intended to replace advice given to you by your health care provider. Make sure you discuss any questions you have with your health care provider. Document Released: 10/30/2001 Document Revised: 01/02/2016 Document Reviewed: 03/19/2015 Elsevier Interactive Patient Education  [employer].   If you have lab work done today you will be contacted with your lab results within the next 2 weeks.  If you have not heard from [place] then please contact us. The fastest way to get your results is to register for My Chart.   IF you received an x-ray today, you  will receive an invoice from [facility]  Radiology. Please contact [facility] at [phone] with questions or concerns regarding your invoice.   IF you received labwork today, you will receive an invoice from [place]. Please contact [facility] at [phone] with questions or concerns regarding your invoice.   Our billing staff will not be able to assist you with questions regarding bills from these companies.  You will be contacted with the lab results as soon as they are available. The fastest way to get your results is to activate your My Chart account. Instructions are located on the last page of this paperwork. If you have not heard from [place] regarding the results in 2 weeks, please contact this office.       Signed,   Merri Ray, MD [facility].  06/29/18 10:19 PM

## 2018-06-27 NOTE — Patient Instructions (Addendum)
X-ray looked okay.  I expect the swelling and discomfort to improve in the next few weeks.  If any continued issues in 3 to 4 weeks I am happy to refer you to hand specialist, sooner if worsening.  Hand Contusion A hand contusion is a deep bruise to the hand. Contusions are the result of a blunt injury to tissues and muscle fibers under the skin. The injury causes bleeding under the skin. The skin overlying the contusion may turn blue, purple, or yellow. Minor injuries will give you a painless contusion, but more severe contusions may stay painful and swollen for a few weeks. What are the causes? A contusion is usually caused by a hard hit, trauma, or direct force to your hand, such as having a heavy object fall on your hand. What are the signs or symptoms? Symptoms of this condition include:  Swelling of the hand.  Pain and tenderness of the hand.  Discoloration of the hand. The area may have redness and then turn blue, purple, or yellow. How is this diagnosed? This condition is diagnosed from a physical exam and your medical history. An X-ray may be needed to see if there are any other injuries, such as broken bones (fractures). Sometimes, a CT scan or MRI may be needed if your health care provider is concerned that you may have torn or injured ligaments. How is this treated? An elastic wrap may be recommended to support your hand. In general, the best treatment for a hand contusion is rest, ice, pressure (compression), and elevation of the injured area. This is often called RICE therapy. Over-the-counter medicines may also be recommended for pain control. Follow these instructions at home: [place] the injured area.  If directed, apply ice to the injured area: ? Put ice in a plastic bag. ? Place a towel between your skin and the bag. ? Leave the ice on for 20 minutes, 2-3 times a day.  If directed, apply light compression to the injured area using an elastic wrap. Make sure  the wrap is not too tight. Remove and reapply the wrap as told by your health care provider. If your fingers become numb, cold, or blue, take the wrap off and reapply it more loosely.  Raise (elevate) the injured area above the level of your heart while you are sitting or lying down. General instructions   Take over-the-counter and prescription medicines only as told by your health care provider.  Protect your hand from getting injured further.  Keep all follow-up visits as told by your health care provider. This is important. Contact a health care provider if:  Your symptoms do not improve after several days of treatment.  You have increased redness, swelling, or pain in your hand or fingers.  You have difficulty moving the injured area.  Your swelling or pain is not relieved with medicines. Get help right away if:  You have severe pain.  Your hand or fingers become numb.  Your hand or fingers turn pale, blue, or cold.  You cannot move your hand or wrist.  Your hand is warm to the touch. This information is not intended to replace advice given to you by your health care provider. Make sure you discuss any questions you have with your health care provider. Document Released: 10/30/2001 Document Revised: 01/02/2016 Document Reviewed: 03/19/2015 Elsevier Interactive Patient Education  [employer].   If you have lab work done today you will be contacted with your lab results within  the next 2 weeks.  If you have not heard from [place] then please contact us. The fastest way to get your results is to register for My Chart.   IF you received an x-ray today, you will receive an invoice from [facility]. Please contact [facility]iology at [phone] with questions or concerns regarding your invoice.   IF you received labwork today, you will receive an invoice from [place]. Please contact [facility] at [phone] with questions or concerns regarding your invoice.    Our billing staff will not be able to assist you with questions regarding bills from these companies.  You will be contacted with the lab results as soon as they are available. The fastest way to get your results is to activate your My Chart account. Instructions are located on the last page of this paperwork. If you have not heard from [place] regarding the results in 2 weeks, please contact this office.

## 2018-06-27 NOTE — Progress Notes (Signed)
    Daily Group Progress Note  Program: CD-IOP   06/27/2018 Yeily Link [id number]  Diagnosis:  Alcohol use disorder, severe, dependence ([place])  Chronic anxiety  Stage of Change: Action  Sobriety Date: 12/5  Group Time: 1-2:30  Participation Level: Active  Behavioral Response: Appropriate and Sharing  Type of Therapy: Process Group  Interventions: CBT  Topic: PTs were active and engaged in group processing session. Counselor facilitated CBT-based and emotion-focused processing questions to help PTs discuss their recovery from mind-altering drugs/alcohol. Emphasis was placed on PTs disclosing their challenges and successes pertaining to their treatment plan goals.     Counselor facilitated topical discussion using recovery-based topics during PT check-ins. One PT was advised by our Medical Director to leave early due to medical necessity. UDS samples were collected from 4 members. 2 members were new to group today. UDS results were provided to 3 PTs.      Group Time: 2:30-4  Participation Level: Active  Behavioral Response: Appropriate and Sharing  Type of Therapy: Psycho-education Group  Interventions: Meditation: Chair Yoga, "Non-Judgment"   Topic: Patient were active and engaged in psychoeducation session in which counselor led a 1 hour Chair Yoga routine. Emphasis was placed on PTs learning skills for balancing mind, breath, and body. PTs were encouraged to adapt poses as needed for individual pain and discomfort. Counselor processed meaning of "non-judgment" and how this applies to relapse prevention, and dismissing thoughts.    Summary: PT was active, engaged, reported she had felt somewhat agitated yesterday after returning from a trip to [place] w/ her family and friends. She picked up her 60 day chip this morning. PT reflected on her disregulated mood since she missed a meeting Sat and Sun due to travel. PT told the group she called her sponsor "in need"  for the first time and it was very helpful. PT identified connection b/w obsessive thinking and her addiction. PT commented that many people are telling her she needs to pray. PT reported she enjoyed yoga and felt significantly more relaxed than when she came to group. PT was returned a lab result from a Thyroid Test run by our office last week. Results indicated levels WNL.   UDS collected: No Results: negative  AA/NA attended?: YesMonday and Friday  Sponsor?: Yes   [name], LPCA LCASA 06/27/2018 1:38 PM

## 2018-06-28 ENCOUNTER — Encounter (HOSPITAL_COMMUNITY): Payer: Self-pay

## 2018-06-28 ENCOUNTER — Other Ambulatory Visit (HOSPITAL_COMMUNITY): Payer: BLUE CROSS/BLUE SHIELD | Admitting: Psychology

## 2018-06-28 DIAGNOSIS — F1020 Alcohol dependence, uncomplicated: Secondary | ICD-10-CM

## 2018-06-28 DIAGNOSIS — F4310 Post-traumatic stress disorder, unspecified: Secondary | ICD-10-CM

## 2018-06-28 DIAGNOSIS — F88 Other disorders of psychological development: Secondary | ICD-10-CM

## 2018-06-28 NOTE — Progress Notes (Signed)
    Daily Group Progress Note  Program: CD-IOP   06/28/2018 Crystal Mcknight [id number]  Diagnosis:  Alcohol use disorder, severe, dependence ([facility])  PTSD (post-traumatic stress disorder)  Family history of alcoholism in father   Sobriety Date: 04-27-18  Group Time: 1-2:30pm  Participation Level: Active  Behavioral Response: Appropriate  Type of Therapy: Process Group  Interventions: Supportive  Topic: Process: The first part of group began with process. Group members shared their experiences in early recovery since we last met on Monday. One group member was absent. Two drug tests were collected. Four group members met with the Medical Director to complete three medication checks and one initial evaluation. One new group member was present and introduced herself to the group.      Group Time: 2:30-4pm  Participation Level: Active  Behavioral Response: Appropriate  Type of Therapy: Psycho-education Group  Interventions: Family Systems  Topic: Psycho-ed: Family Sculpture; The second half of group was spent in psycho-ed reenacting "sculptures" of group members childhood memories. Group members were instructed to select other members of the group to represent a family member from their family of origin. Then group members staged their participants to re-create a scene or memory from childhood. Group members processed their emotions, feelings, thoughts that came up for them in doing this activity.    Summary: The patient reported that she attended three [place] meetings since we last met on Monday. The patient shared that she has been "doing family stuff" the last few days. The patient attended a parent-teacher meeting at her son's school for her son's "outbursts." The patient has been unable to attend a parent-teacher since before she attended [employer] in December. The patient shared that her son is struggling in school right now due to recent family deaths. The patient  shared fears that her son feels a lot of pressure to be there for his grandfather who recently lost a son. The patient is struggling to re-integrate herself back into the swing of her family's life. She is focusing on herself but it is difficult when she is responsible for children. During the family sculpture activity, the patient was hesitant to recreate her family dynamics. The patient is searching to figure out where her resentments lie. She stated, "I am not angry with my parents." The patient grew up with chaotic older brothers, whom she cared for, but does not find herself resentful towards them or her parents.    UDS collected?: No AA/NA attended?: Yes  Sponsor?: Yes   Brandon Melnick, [place] 06/28/2018 11:58 AM

## 2018-06-29 ENCOUNTER — Encounter (HOSPITAL_COMMUNITY): Payer: Self-pay

## 2018-06-29 ENCOUNTER — Other Ambulatory Visit (HOSPITAL_COMMUNITY): Payer: BLUE CROSS/BLUE SHIELD

## 2018-06-29 ENCOUNTER — Encounter (HOSPITAL_COMMUNITY): Payer: Self-pay | Admitting: Psychology

## 2018-06-29 NOTE — Progress Notes (Signed)
    Daily Group Progress Note  Program: CD-IOP   06/29/2018 Crystal Mcknight [id number]  Diagnosis: Alcohol Use Disorder, Severe Chronic Post Traumatic Disorder  Sobriety Date: 12/5  Group Time: 1-2:30pm  Participation Level: Active  Behavioral Response: Sharing  Type of Therapy: Process Group  Interventions: Supportive  Topic: Process: the first half of group was spent in process. Members shared about their progress in early recovery, including meetings attended, step work and any other activities that promote sobriety. A new group member was present and during this half of group, he introduced himself. The medical director met with one member for discharge plan and another for his initial evaluation. Four drug tests were collected. One group member was absent.  Group Time: 2:30-4pm  Participation Level: Active  Behavioral Response: Appropriate and Sharing  Type of Therapy: Psycho-education Group  Interventions: CBT  Topic: Psycho-Education: "Values Sort". The second half of group was spent in a psychoeducation session. Members were provided with thirty-one slips of paper with a value listed on each slip. Members were 'guided' through the process of gradually reducing the number of these values until they had eliminated all but the three most important ones. The remainder of the session consisted of a discussion on what had led to identifying these final three values. Further, they were invited to share how they can live out these values in their everyday life.   Summary: The patient reported she had attended three meetings yesterday. She is working to get back on track with 90 in 90. She admitted she is having problems 'getting on my knees'. The patient reported she prays constantly throughout the day, but the physical process of getting down on her knees has not occurred. "I tried to hit my knees, but I can't". she admitted that she struggles with her Higher Power. The patient  shared that whatever the challenges or temptations, using is "not an option." She was referring to the damage she believes her drinking has caused her children. In the psycho-ed, the patient reported it was difficult to pare down the list of values. She was able to identify: "family, love and peace", with family being the #1 value. The patient expressed her frustrations with her alcoholic brother who has 'substituted' his alcohol use with exercise and religion. She found this very difficult. The patient shared about her feelings and disclosed more about her inner self. She responded well to this intervention.  UDS collected: Yes Results: pending  AA/NA attended?: YesTuesday  Sponsor?: Yes   Brandon Melnick, LCAS 06/29/2018 11:17 AM

## 2018-07-03 ENCOUNTER — Encounter (HOSPITAL_COMMUNITY): Payer: Self-pay

## 2018-07-03 ENCOUNTER — Other Ambulatory Visit (HOSPITAL_COMMUNITY): Payer: BLUE CROSS/BLUE SHIELD | Admitting: Psychology

## 2018-07-03 DIAGNOSIS — F1020 Alcohol dependence, uncomplicated: Secondary | ICD-10-CM

## 2018-07-03 DIAGNOSIS — F419 Anxiety disorder, unspecified: Secondary | ICD-10-CM

## 2018-07-03 NOTE — Progress Notes (Signed)
    Daily Group Progress Note  Program: CD-IOP   07/03/2018 Crystal Mcknight [id number]  Diagnosis:  Alcohol use disorder, severe, dependence (HCC)  Chronic anxiety  Stage of Change: Action, Planning  Sobriety Date: 12/5  Group Time: 1-2:30  Participation Level: Active  Behavioral Response: Evasive  Type of Therapy: Process Group  Interventions: CBT and Supportive  Topic: PTs were active and engaged in group processing session. Counselor facilitated CBT-based and emotion-focused processing questions to help PTs discuss their recovery from mind-altering drugs/alcohol. Emphasis was placed on PTs disclosing their challenges and successes pertaining to their treatment plan goals.     One PT met w/ program director for initial visit. UDS results were returned to some members. UDS samples were collected from 4 members.      Group Time: 2:30-4  Participation Level: Active  Behavioral Response: Appropriate and Sharing  Type of Therapy: Psycho-education Group  Interventions: Psychosocial Skills: Communication  Topic: Patient were active and engaged in psychoeducation session in which counselor led a 1 hour session on Communicaiton. Counselor led an exercised in which PTs drew geometric patterns and practiced communicating w/ each other. PTs were invited to identify their areas of growth regarding their own communication.     Summary: PT was active, engaged, reported attending 6 AA meetings since last group, offered helpful feedback to another member about drinking tea to relax. PT continues to report "stuff in her head" referring to her anxiety, which she lacks insight into. PT was asked by counselor about possibility of ADHD and PT responded she has been tested in the past and results were negative for ADHD. PT reported on her upcoming trip to [place] w/ family, which is a family tradition.    UDS collected: Yes Results: 2/10 negative  AA/NA attended?: YesFriday,  Saturday and Sunday  Sponsor?: Yes  [name], LPCA LCASA 07/03/2018 5:15 PM

## 2018-07-05 ENCOUNTER — Encounter (HOSPITAL_COMMUNITY): Payer: Self-pay | Admitting: Medical

## 2018-07-05 ENCOUNTER — Other Ambulatory Visit (HOSPITAL_COMMUNITY): Payer: BLUE CROSS/BLUE SHIELD | Admitting: Psychology

## 2018-07-05 ENCOUNTER — Encounter (HOSPITAL_COMMUNITY): Payer: Self-pay

## 2018-07-05 DIAGNOSIS — F341 Dysthymic disorder: Secondary | ICD-10-CM

## 2018-07-05 DIAGNOSIS — Z811 Family history of alcohol abuse and dependence: Secondary | ICD-10-CM

## 2018-07-05 DIAGNOSIS — F4310 Post-traumatic stress disorder, unspecified: Secondary | ICD-10-CM

## 2018-07-05 DIAGNOSIS — Z6372 Alcoholism and drug addiction in family: Secondary | ICD-10-CM

## 2018-07-05 DIAGNOSIS — F1020 Alcohol dependence, uncomplicated: Secondary | ICD-10-CM

## 2018-07-05 DIAGNOSIS — F419 Anxiety disorder, unspecified: Secondary | ICD-10-CM

## 2018-07-05 DIAGNOSIS — F88 Other disorders of psychological development: Secondary | ICD-10-CM

## 2018-07-05 NOTE — Progress Notes (Signed)
Patient ID: Crystal Mcknight, female   DOB: 16-Dec-1975, 43 y.o.   MRN: [id number]  [facility] Follow-up Outpatient Visit   Date: 06/19/2018  Admission Date:06/01/2018  Sobriety date: 04/29/2019  Subjective: "I'm tired"  HPI : Provider Fu Thyroid studies Pt had TSH drawn that was WNL by lab but according to her was"too high.(TSH 3.890  Ref 0.450-4.500 uIU/ml) I need to be in the 2's.I've been seeing an Endocrinologist for 14 years".    Review of Systems: Psychiatric: Agitation: No Hallucination: No Depressed Mood: PHQ 9 06/12/2018 Score 5 Insomnia: Diaphoresis on Trazodone-able to sleep without swaeing on Seroquel ?quality-"Might" have been better on Trazodone? But kept waking  up Hypersomnia: No Altered Concentration: No Feels Worthless: No Grandiose Ideas: No Belief In Special Powers: No New/Increased Substance Abuse: No Compulsions: Lifelong habit of being the "hero" in her alcoholic family/caretaker  Neurologic: Headache: No Seizure: No Paresthesias: No  Current Medications:                                                                                                                         TABS tablet Take 1 tablet by mouth daily.     Marland Kitchen SYNTHROID 175 MCG tablet     . triamcinolone cream (KENALOG) 0.1 % Apply 1 application topically 2 (two) times daily. 30 g 0  . Vitamins/Minerals TABS Take by mouth.    Marland Kitchen albuterol (VENTOLIN HFA) 108 (90 Base) MCG/ACT inhaler 1-2 inhalations every 4-6 hours as needed for wheezing. Dispense spacer as needed.    Marland Kitchen azithromycin (ZITHROMAX) 250 MG tablet Take 1 tablet (250 mg total) by mouth daily. Take first 2 tablets together, then 1 every day until finished. (Patient not taking: Reported on 06/05/2018) 6 tablet 0  . fluconazole (DIFLUCAN) 150 MG tablet Take 1 tablet (150 mg total) by mouth daily. (Patient not taking: Reported on 06/05/2018) 2 tablet 0  . fluticasone (FLONASE) 50 MCG/ACT nasal spray Place into  both nostrils daily.    . hydrOXYzine (ATARAX/VISTARIL) 25 MG tablet Take 0.5-2 tablets (12.5-50 mg total) by mouth every 6 (six) hours as needed for anxiety or itching. 30 tablet 0  . ibuprofen (ADVIL,MOTRIN) 400 MG tablet Take 400 mg by mouth every 6 (six) hours as needed.    Marland Kitchen QUEtiapine (SEROQUEL) 25 MG tablet Take 1 tablet (25 mg total) by mouth at bedtime as needed and may repeat dose one time if needed. 60 tablet 2  . sertraline (ZOLOFT) 50 MG tablet Take 1 tablet (50 mg total      Mental Status Examination  Appearance: Alert: Yes Attention: good  Cooperative: Yes Eye Contact: Good Speech: Clear and coherent Psychomotor Activity: Normal Memory/Concentration: Normal/intact Oriented: person, place, time/date and situation Mood: Euthymic Affect: Appropriate and Congruent Thought Processes and Associations: Coherent and Intact Fund of Knowledge: Good Thought Content: WDL Insight: Good Judgement: Good  WUJ:WJXBJ  Assessment:  Early remission chronic alcoholism.C/O fatigue. Hx of Hypothyroidism  Diagnosis:  Alcohol use disorder, severe, dependence ([address] [place] [address])  F10.20    2. Family history of alcoholism in father Z63.72   3. Dysthymia (or depressive neurosis) F34.1   4. Chronic anxiety F41.9   5. Delayed emotional development F88    Adult child of alcoholic/dysfunctional family  6. Unexplained night sweats R61   7. Acquired hypothyroidism E03.9   8. PTSD (post-traumatic stress disorder) F43.10   9. Medication side effect, initial encounter      Treatment Plan: Per Initial Evaluation. Check Thyroid panel and T3 FU WHEN BACK    Darlyne Russian, PA-C

## 2018-07-06 ENCOUNTER — Other Ambulatory Visit (HOSPITAL_COMMUNITY): Payer: BLUE CROSS/BLUE SHIELD

## 2018-07-06 ENCOUNTER — Encounter (HOSPITAL_COMMUNITY): Payer: Self-pay | Admitting: Psychology

## 2018-07-06 ENCOUNTER — Encounter (HOSPITAL_COMMUNITY): Payer: Self-pay

## 2018-07-06 ENCOUNTER — Encounter (HOSPITAL_COMMUNITY): Payer: Self-pay | Admitting: Licensed Clinical Social Worker

## 2018-07-06 DIAGNOSIS — F1020 Alcohol dependence, uncomplicated: Secondary | ICD-10-CM

## 2018-07-06 NOTE — Progress Notes (Signed)
    Daily Group Progress Note  Program: CD-IOP   07/06/2018 [name] [id number]  Diagnosis:  Alcohol use disorder, severe, dependence (HCC)  PTSD (post-traumatic stress disorder)  Family history of alcoholism in father  Dysthymia (or depressive neurosis)  Chronic anxiety  Delayed emotional development   Sobriety Date: 04/27/18  Group Time: 1-2:30pm  Participation Level: Active  Behavioral Response: Sharing  Type of Therapy: Process Group  Interventions: Supportive  Topic: Process: the first half of group was spent in process. Members shared about any speed bumps or shining moments in early recovery. They recounted meetings attended and realizations or insights gleaned from step work or experiences in group. The program director met with at least six group members for medication checks. Three drug tests were collected. One group member was absent.  Group Time: 2:30-4pm  Participation Level: Active  Behavioral Response: Appropriate and Sharing  Type of Therapy: Psycho-education Group  Interventions: Psychosocial Skills: Communication  Topic: Psychoeducation: Communication; Part I. The second half of group included a psycho-ed on the different types of communication. Members were provided a handout listing the four types:  passive, aggressive, passive-aggressive and assertive. Members took turns reading detailed descriptions of each style with a discussion following. Most members admitted they struggle to communicate assertively.   Summary: The patient reported she had attended three [place] meetings since we last met. she admitted she has been feeling "lots of emotions" and it has been tough. She provided supportive feedback to her fellow group members who have been reluctant to attend AA and secure a sponsor. She reminded the group that she was traveling to [place] tomorrow after the session to visit with her family. Apparently, it is a tradition. She insisted  that everyone that is going knows she is not drinking but admitted that every past year she has drank a lot. The patient met briefly with the medical director. In the psycho-ed, the patient was a somewhat distant and shared little of herself. 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 The patient was reluctant to share and had admitted being very emotional. We will continue to follow closely in the days ahead.  UDS collected: No   AA/NA attended?: YesTuesday and Wednesday  Sponsor?: Yes   Brandon Melnick, LCAS 07/06/2018 8:32 AM

## 2018-07-07 ENCOUNTER — Encounter (HOSPITAL_COMMUNITY): Payer: Self-pay | Admitting: Licensed Clinical Social Worker

## 2018-07-07 NOTE — Progress Notes (Signed)
  CD-IOP INDIVIDUAL SESSION THERAPIST PROGRESS NOTE  Session Time: 11-12  Participation Level: Active  Behavioral Response: Well GroomedAlertAnxious and Tearful  Type of Therapy: Individual Therapy  Treatment Goals addressed: Diagnosis: Alcohol Use Disorder, Severe  Interventions: CBT and Supportive  Summary: Crystal Mcknight is a 43 y.o. female who presents with Alcohol Use Disorder, Severe and Anxiety. She is agitated, and engaged in session. She becomes tearful easily. She discusses personal relationship issues she is having w/ her husband and another relationship she was involved in w/ a neighbor. PT and counselor discuss coping strategies for panic sxs, and rumination of anxiety.    Suicidal/Homicidal: Nowithout intent/plan  Therapist Response: Counselor used open questions, supportive listening, and reflection. Counselor encouraged PT and reflected her bx changes and briefly taught psychoed about PAWS. PT appears agitated by her upcoming trip to [place] and called it "like going home for me". PT continues to struggle w/ feeling her feelings instead of intellectualizing them.   Plan: Continue in CD-IOP for 4 weeks.   Diagnosis: No diagnosis found.     Archie Balboa, LCAS-A 07/07/2018

## 2018-07-10 ENCOUNTER — Other Ambulatory Visit (HOSPITAL_COMMUNITY): Payer: BLUE CROSS/BLUE SHIELD

## 2018-07-10 ENCOUNTER — Encounter (HOSPITAL_COMMUNITY): Payer: Self-pay

## 2018-07-11 ENCOUNTER — Telehealth (HOSPITAL_COMMUNITY): Payer: Self-pay | Admitting: Psychology

## 2018-07-11 NOTE — Progress Notes (Signed)
Crystal Mcknight is a 43 y.o. female patient. CD-IOP: The patient appeared for group today. However, she had to leave early and will not be billed nor credited with this session. Please see note below.   The patient reported she had attended one [place] meeting since we last met yesterday on Wednesday. The word the patient received for her popsicle stick check in was "holidays and family." The patient shared about her emotional state and shared that she has "not been able to stop crying." The patient shared with the group that she is starting to find her emotions. The patient shared that she is not used to being sober and not knowing how to deal with feelings. The patient shared that yesterday she had a conversation with her husband about emotions and was able to ask him to just be there for her and hold her. The patient was proud of herself because she was able to ask for what she needed. The patient also provided some supportive feedback to another group member about what the meetings have done to help her. The patient shared about her initial hesitancy as well as how important the meetings are to growing in recovery. The patient left group early to go pick her children up from school and will not be billed for today's group session.        Brandon Melnick, LCAS

## 2018-07-12 ENCOUNTER — Encounter (HOSPITAL_COMMUNITY): Payer: Self-pay | Admitting: Licensed Clinical Social Worker

## 2018-07-12 ENCOUNTER — Encounter (HOSPITAL_COMMUNITY): Payer: Self-pay

## 2018-07-12 ENCOUNTER — Other Ambulatory Visit (HOSPITAL_COMMUNITY): Payer: BLUE CROSS/BLUE SHIELD | Admitting: Psychology

## 2018-07-12 DIAGNOSIS — F4310 Post-traumatic stress disorder, unspecified: Secondary | ICD-10-CM

## 2018-07-12 DIAGNOSIS — Z811 Family history of alcohol abuse and dependence: Secondary | ICD-10-CM

## 2018-07-12 DIAGNOSIS — F1020 Alcohol dependence, uncomplicated: Secondary | ICD-10-CM

## 2018-07-12 DIAGNOSIS — Z6372 Alcoholism and drug addiction in family: Secondary | ICD-10-CM

## 2018-07-13 ENCOUNTER — Other Ambulatory Visit (HOSPITAL_COMMUNITY): Payer: BLUE CROSS/BLUE SHIELD | Admitting: Psychology

## 2018-07-13 DIAGNOSIS — F1020 Alcohol dependence, uncomplicated: Secondary | ICD-10-CM | POA: Diagnosis not present

## 2018-07-13 DIAGNOSIS — F4310 Post-traumatic stress disorder, unspecified: Secondary | ICD-10-CM

## 2018-07-14 ENCOUNTER — Encounter (HOSPITAL_COMMUNITY): Payer: Self-pay | Admitting: Licensed Clinical Social Worker

## 2018-07-14 ENCOUNTER — Encounter (HOSPITAL_COMMUNITY): Payer: Self-pay | Admitting: Psychology

## 2018-07-14 NOTE — Progress Notes (Signed)
   THERAPIST PROGRESS NOTE  Session Time: 11:30-12:30pm  Participation Level: Active  Behavioral Response: Well GroomedAlertAnxious  Type of Therapy: Individual Therapy  Treatment Goals addressed: Coping  Interventions: CBT and Supportive  Summary: Crystal Mcknight is a 43 y.o. female who presents with Alcohol Use Disorder, Severe and GAD. She presents for weekly individual therapy as part of CD-IOP. PT is active, engaged, loquacious and agitated in session. She reports she has been struggling to connect w/ her husband since coming back from rehab. PT talks throughout session, intellectualizes her feelings, and seems intent on understanding why she acts and feels the way she feels. PT was encouraged to give herself grace, consider impact of PAWS, and was directed to consider the impact of her addiction on her husband and her family.    Suicidal/Homicidal: Nowithout intent/plan  Therapist Response: Counselor used open questions, active listening, and empathic reflection. Counselor helped PT identify her [place] goals and emphasized bxs that were congruent w/ her goals.   Plan: Continue in CD-IOP for 3 weeks.   Diagnosis:    ICD-10-CM   1. Alcohol use disorder, severe, dependence ([place]) F10.20   2. PTSD (post-traumatic stress disorder) F43.10       Archie Balboa, LCAS-A 07/14/2018

## 2018-07-14 NOTE — Progress Notes (Signed)
    Daily Group Progress Note  Program: CD-IOP   07/14/2018 Crystal Mcknight [id number]  Diagnosis: Alcohol Use Disorder, Severe  Sobriety Date: 04/27/18  Group Time: 1-2:30pm  Participation Level: Active  Behavioral Response: Appropriate and Sharing  Type of Therapy: Process Group  Interventions: Supportive  Topic: Process: the first half of group was spent in process. Members shared about the progress in early recovery. They identified meetings they had attended, working with their sponsor on step work and the ways they are staying healthy and sober. "Shares" also included any challenges or frustrations along the way. Three drug tests were collected. The medical director met with one patient for a med check and follow-up.   Group Time: 2:30-4pm  Participation Level: Active  Behavioral Response: Sharing  Type of Therapy: Psycho-education Group  Interventions: Emotions  Topic: Psycho-Education: Chaplain. The second half of group consisted of a psycho-ed with a chaplain. Bruce Messenger, [facility] Chaplain, led the session on the topic of "Feelings'. They session was poignant with members sharing about current feelings, but also feelings developed or experienced in childhood. Everyone participated in this session.   Summary:The patient appeared today for the first time since last Thursday. She had gone to [place] on a preplanned annual trip with her two children. The patient stated, "I know why people relapse at airports". She shared that it had been stressful and admitted, "I almost felt drunk". She had attended three AA meetings since we last saw her, including a meeting in [place]. The patient reported that she had spoken her daughter and their conversation was 'transformative for their relationship now, and probably in the future'. She was very pleased and extremely grateful that she had been able to talk to her daughter and really hear her. Her daughter told her that 'I was really  scared when you left', referring to her mother entering [facility] in early December and being gone through the holiday. Being able to share and talk was 'my biggest reward', the patient exclaimed. She was attentive and engaged in the psycho-ed with the visiting chaplain and made insightful comments. The patient had put herself in what might have been an extremely challenging weekend with family where she has historically consumed large quantities of alcohol. This time, she did not, and reported the trip turned out to be a good one. She responded well to this intervention.      UDS collected: Yes Results: pending  AA/NA attended?: YesTuesday, Wednesday and Saturday  Sponsor?: Yes    , LCAS 07/14/2018 8:14 AM 

## 2018-07-17 ENCOUNTER — Other Ambulatory Visit (HOSPITAL_COMMUNITY): Payer: BLUE CROSS/BLUE SHIELD | Admitting: Psychology

## 2018-07-17 DIAGNOSIS — F4310 Post-traumatic stress disorder, unspecified: Secondary | ICD-10-CM

## 2018-07-17 DIAGNOSIS — F1020 Alcohol dependence, uncomplicated: Secondary | ICD-10-CM | POA: Diagnosis not present

## 2018-07-18 ENCOUNTER — Encounter (HOSPITAL_COMMUNITY): Payer: Self-pay | Admitting: Psychology

## 2018-07-18 NOTE — Progress Notes (Signed)
    Daily Group Progress Note  Program: CD-IOP   07/18/2018 Haiven Nardone [id number]  Diagnosis: Alcohol Use Disorder, Severe  Sobriety Date: 04/27/18  Group Time: 1-2:30pm  Participation Level: Active  Behavioral Response: Sharing  Type of Therapy: Process Group  Interventions: Supportive  Topic: Process: The first part of group of began with process. Group members shared their experiences in early recovery since we last met on Wednesday. All group members were present today.  Group Time: 2:30-4pm  Participation Level: Active  Behavioral Response: Sharing  Type of Therapy: Psycho-education Group  Interventions: Psychosocial Skills: Vulnerability  Topic: Psycho-ed: Vulnerability Video; The second half of group was spent in psycho-ed discussing the importance of Vulnerability. Group members were shown a TED talk given by Almond Lint on Vulnerability as the key to human connection. After the video was over, group members talked about their thoughts on video. Group members focused on topics such as shame preventing vulnerability, numbing uncomfortable emotions, and learning to accept themselves.   Summary: The patient reported that she had attended one [place] meeting since we last met yesterday on Wednesday. The patient shared with the group about some parenting struggles she is having with her daughter. The patient shared that she got a call from the local elementary school that her 30 year old daughter was found on the roof of the school with some of her friends. The patient expressed being upset that the school made it seem like "no big deal," when the patient views this act as trespassing. The patient stated that she is unsure what a correct punishment should be for her daughter. The patient also processed with the group about some realizations she had from her trip to [place]. The patient realized that even though she was around drinking the entire time of her trip, she  realized that she would not have liked to "drink like them." The patient processed how her drinking always turned into a "party of one." During the psycho-ed portion of group, the patient attentively watched the video. Afterwards, she processed her feeling about being vulnerable and how fear stops her from sharing herself with others.    UDS collected: No   AA/NA attended?: YesThursday  Sponsor?: Yes   Brandon Melnick, [place] 07/18/2018 9:38 AM

## 2018-07-19 ENCOUNTER — Other Ambulatory Visit (HOSPITAL_COMMUNITY): Payer: BLUE CROSS/BLUE SHIELD | Admitting: Psychology

## 2018-07-19 DIAGNOSIS — F4310 Post-traumatic stress disorder, unspecified: Secondary | ICD-10-CM

## 2018-07-19 DIAGNOSIS — F1020 Alcohol dependence, uncomplicated: Secondary | ICD-10-CM | POA: Diagnosis not present

## 2018-07-19 DIAGNOSIS — Z6372 Alcoholism and drug addiction in family: Secondary | ICD-10-CM

## 2018-07-19 DIAGNOSIS — Z811 Family history of alcohol abuse and dependence: Secondary | ICD-10-CM

## 2018-07-20 ENCOUNTER — Other Ambulatory Visit (HOSPITAL_COMMUNITY): Payer: BLUE CROSS/BLUE SHIELD | Admitting: Psychology

## 2018-07-20 ENCOUNTER — Encounter (HOSPITAL_COMMUNITY): Payer: Self-pay | Admitting: Psychology

## 2018-07-20 DIAGNOSIS — F4310 Post-traumatic stress disorder, unspecified: Secondary | ICD-10-CM

## 2018-07-20 DIAGNOSIS — F1020 Alcohol dependence, uncomplicated: Secondary | ICD-10-CM | POA: Diagnosis not present

## 2018-07-20 NOTE — Progress Notes (Signed)
    Daily Group Progress Note  Program: CD-IOP   07/20/2018 Armonee Bojanowski [id number]  Diagnosis:  Alcohol use disorder, severe, dependence (HCC)  PTSD (post-traumatic stress disorder)  Stage of Change: Action  Sobriety Date: 12/5  Group Time: 1-2:30  Participation Level: Active  Behavioral Response: Appropriate and Sharing  Type of Therapy: Process Group  Interventions: CBT  Topic: PTs were active and engaged in group processing session. Counselor facilitated CBT-based and emotion-focused processing questions to help PTs discuss their recovery from mind-altering drugs/alcohol. Emphasis was placed on PTs disclosing their challenges and successes pertaining to their treatment plan goals.  One new member was present and met w/ medical director to establish care. One member graduated successfully from group and met w/ Market researcher for d/c. UDS samples were collected from 5 group members. One member had relapsed over weekend and felt ashamed. Group offered suppportive feedback.       Group Time: 2:30-4  Participation Level: Active  Behavioral Response: Appropriate and Sharing  Type of Therapy: Psycho-education Group  Interventions: CBT  Topic: Patient were active and engaged in psychoeducation session in which counselor led a 1 hour session on "Realistic Self Talk", provided a handout on improving self talk during crisis and anxiety. PTs shared their favorite responses and practiced out loud.   Summary: Pt was agitated, made infrequent eye contact, and became tearful in session when describing her feeling "uneasy, anxious, and irritated". PT is unsure of her feelings and is not comfortable expressing sadness or disappointment. PT states she is feeling "angry and not acting like myself". Counselor encouraged her to share her emotions openly and challenged her meaning making and though process, reminding her of her need to focus on the here-and-now. PT is working on  her goals of strengthening her recovery supports, calling sober friends when needed, and utilizing relapse prevention training that she learns in group.    UDS collected: No Results: Most recent 2/19 negative  AA/NA attended?: YesFriday, Saturday and Sunday  Sponsor?: Yes   [name], [facility], [place] 07/20/2018 5:43 PM

## 2018-07-21 ENCOUNTER — Encounter (HOSPITAL_COMMUNITY): Payer: Self-pay | Admitting: Licensed Clinical Social Worker

## 2018-07-21 ENCOUNTER — Encounter (HOSPITAL_COMMUNITY): Payer: Self-pay | Admitting: Psychology

## 2018-07-21 DIAGNOSIS — F419 Anxiety disorder, unspecified: Secondary | ICD-10-CM

## 2018-07-21 DIAGNOSIS — Z1231 Encounter for screening mammogram for malignant neoplasm of breast: Secondary | ICD-10-CM | POA: Diagnosis not present

## 2018-07-21 DIAGNOSIS — F1020 Alcohol dependence, uncomplicated: Secondary | ICD-10-CM

## 2018-07-21 DIAGNOSIS — Z01419 Encounter for gynecological examination (general) (routine) without abnormal findings: Secondary | ICD-10-CM | POA: Diagnosis not present

## 2018-07-21 DIAGNOSIS — Z681 Body mass index (BMI) 19 or less, adult: Secondary | ICD-10-CM | POA: Diagnosis not present

## 2018-07-21 NOTE — Progress Notes (Signed)
    Daily Group Progress Note  Program: CD-IOP   07/21/2018 Crystal Mcknight [id number]  Diagnosis: Alcohol Use Disorder, Severe, Chronic PTSD  Sobriety Date: 04/27/18  Group Time: 1-2:30pm  Participation Level: Active  Behavioral Response: Appropriate and Sharing  Type of Therapy: Process Group  Interventions: Supportive  Topic: Process: The first half of group was spent in process. Members shared about any challenges ort struggles in early recovery as well as any successes. They discussed meetings they had attended, increased understanding through sponsor and step work along with just gaining more days of sobriety. A nerw group member was present and he introduced himself during this half of group. Three drug tests were collected.   Group Time: 2:30-4pm  Participation Level: Active  Behavioral Response: Sharing  Type of Therapy: Psycho-education Group  Interventions: Strength-based  Topic: Psycho-education: Neurobiology of Addiction. The second half of group consisted of a brief YouTube and then slide show on the neurobiology of addiction. The biological nature of this disease was emphasized along with the power of the limbic system in overriding the pre-frontal cortex. Members shared in discussion about their own experiences and seemingly "out of control". Others agreed identifying the incongruence of behaviors while impaired and their value system. Members were engaged and active in this session.   Summary: The patient reported she had attended two [place] meetings since we last met. She reported she felt much more relaxed and happier today. Other group members recognized the change and noted she looked different and more relaxed. The patient reported she had attended a PTA meeting and had an incredible experience being conscious and fully aware during the entire session. Historically, the patient admitted she would have resented having to go to the concert and been wanting to  leave shortly after arriving. Instead, she found the concert and their singing very enjoyable and satisfying. It was a very powerful experience for her. The patient also shared that she had spoken with her sponsor and had been told she must call her whenever she is feeling bad or out of sorts. That is what a sponsor is there for. In the psycho-ed, the patient was attentive and shared about her own experiences with exposure to high risk situations and the subsequent cravings. She continues to make significant progress and responded well to this intervention.    UDS collected: Yes Results: Pending  AA/NA attended?: YesTuesday and Wednesday  Sponsor?: Yes   Brandon Melnick, LCAS 07/21/2018 9:54 AM

## 2018-07-21 NOTE — Progress Notes (Signed)
   THERAPIST PROGRESS NOTE  Session Time: 10-11  Participation Level: Active  Behavioral Response: Well GroomedAlertAnxious  Type of Therapy: Individual Therapy  Treatment Goals addressed: CD-IOP TREATMENT PLAN  Interventions: CBT  Summary: Crystal Mcknight is a 43 y.o. female who presents with Alcohol Use Disorder, Severe, and Anxiety. She is attentive, engaged, talkative, and agitated in session. She makes strong eye contact, thoughts are lucid, and mood is stable. She continues to endorse having anxiety about her relationship w/ her husband. She reports she feels "all over the place" today. She has been told by her sponsor she needs to start the 4th step and begin calling sponsor daily or else risk being let go as a sponsee. PT continues to ruminate on past mistakes and is unsure of how to process her emotions.   Suicidal/Homicidal: Nowithout intent/plan  Therapist Response: Counselor used open questions, active listening and targeted goal-setting. Counselor used CBT to assist PT in challenging her negative thinking. Counselor helped PT maintain focus on her immediate goals of sustaining sobriety and building social support.   Plan: Continue in CD-IOP for 2 more weeks.  Diagnosis:    ICD-10-CM   1. Alcohol use disorder, severe, dependence ([place]) F10.20   2. Chronic anxiety F41.9        Archie Balboa, LCAS-A 07/21/2018

## 2018-07-24 ENCOUNTER — Other Ambulatory Visit (HOSPITAL_COMMUNITY): Payer: BLUE CROSS/BLUE SHIELD | Attending: Psychiatry | Admitting: Psychology

## 2018-07-24 DIAGNOSIS — F419 Anxiety disorder, unspecified: Secondary | ICD-10-CM | POA: Insufficient documentation

## 2018-07-24 DIAGNOSIS — R61 Generalized hyperhidrosis: Secondary | ICD-10-CM | POA: Insufficient documentation

## 2018-07-24 DIAGNOSIS — Z6372 Alcoholism and drug addiction in family: Secondary | ICD-10-CM | POA: Insufficient documentation

## 2018-07-24 DIAGNOSIS — E039 Hypothyroidism, unspecified: Secondary | ICD-10-CM | POA: Diagnosis not present

## 2018-07-24 DIAGNOSIS — Z79899 Other long term (current) drug therapy: Secondary | ICD-10-CM | POA: Insufficient documentation

## 2018-07-24 DIAGNOSIS — F4310 Post-traumatic stress disorder, unspecified: Secondary | ICD-10-CM | POA: Insufficient documentation

## 2018-07-24 DIAGNOSIS — T50905A Adverse effect of unspecified drugs, medicaments and biological substances, initial encounter: Secondary | ICD-10-CM | POA: Diagnosis not present

## 2018-07-24 DIAGNOSIS — F1020 Alcohol dependence, uncomplicated: Secondary | ICD-10-CM | POA: Diagnosis present

## 2018-07-24 DIAGNOSIS — F341 Dysthymic disorder: Secondary | ICD-10-CM | POA: Diagnosis not present

## 2018-07-24 DIAGNOSIS — F1994 Other psychoactive substance use, unspecified with psychoactive substance-induced mood disorder: Secondary | ICD-10-CM | POA: Diagnosis not present

## 2018-07-24 DIAGNOSIS — F88 Other disorders of psychological development: Secondary | ICD-10-CM | POA: Insufficient documentation

## 2018-07-26 ENCOUNTER — Encounter (HOSPITAL_COMMUNITY): Payer: Self-pay | Admitting: Psychology

## 2018-07-26 ENCOUNTER — Other Ambulatory Visit (HOSPITAL_COMMUNITY): Payer: BLUE CROSS/BLUE SHIELD | Admitting: Psychology

## 2018-07-26 DIAGNOSIS — F341 Dysthymic disorder: Secondary | ICD-10-CM

## 2018-07-26 DIAGNOSIS — E039 Hypothyroidism, unspecified: Secondary | ICD-10-CM

## 2018-07-26 DIAGNOSIS — Z6372 Alcoholism and drug addiction in family: Secondary | ICD-10-CM

## 2018-07-26 DIAGNOSIS — F88 Other disorders of psychological development: Secondary | ICD-10-CM

## 2018-07-26 DIAGNOSIS — F4310 Post-traumatic stress disorder, unspecified: Secondary | ICD-10-CM

## 2018-07-26 DIAGNOSIS — T50905A Adverse effect of unspecified drugs, medicaments and biological substances, initial encounter: Secondary | ICD-10-CM

## 2018-07-26 DIAGNOSIS — F419 Anxiety disorder, unspecified: Secondary | ICD-10-CM

## 2018-07-26 DIAGNOSIS — Z811 Family history of alcohol abuse and dependence: Secondary | ICD-10-CM

## 2018-07-26 DIAGNOSIS — F1020 Alcohol dependence, uncomplicated: Secondary | ICD-10-CM | POA: Diagnosis not present

## 2018-07-26 DIAGNOSIS — F1994 Other psychoactive substance use, unspecified with psychoactive substance-induced mood disorder: Secondary | ICD-10-CM

## 2018-07-26 DIAGNOSIS — R61 Generalized hyperhidrosis: Secondary | ICD-10-CM

## 2018-07-26 NOTE — Progress Notes (Signed)
    Daily Group Progress Note  Program: CD-IOP   07/26/2018 Crystal Mcknight [id number]  Diagnosis: Alcohol Use Disorder, Severe, Chronic PTSD  Sobriety Date: 04/27/18  Group Time: 1-2:30pm  Participation Level: Active  Behavioral Response: Sharing  Type of Therapy: Process Group  Interventions: Supportive  Topic: Process: The first part of group began with process. Group members shared their experiences in early recovery since we last met on Wednesday. One group member was absent because she was attending a funeral. Two drug tests were collected.   Group Time: 2:30-4pm  Participation Level: Active  Behavioral Response: Appropriate and Sharing  Type of Therapy: Psycho-Education  Interventions: CBT  Topic: Psycho-ed: CBT Model; Correcting Irrational Thoughts: The second half of group was spent in psycho-ed discussing the Cognitive Behavioral Therapy model for correcting irrational thoughts. A worksheet was handed out explaining what irrational thoughts are and how they lead to negative emotions and behaviors. Group members then practiced the CBT model by finding alternative thoughts to their irrational thoughts.    Summary: The patient reported that she had attended one [place] meeting since we last met on Wednesday. The patient shared with the group about her plans to head to the mountains this weekend. The patient shared with the group that she attended a book study at her sponsors house last night and it was "good." The patient shared with the group about the turmoil going in her marriage right now and her husband's stress for the upcoming weekend. The patient processed her feelings with the group and was given supportive feedback from fellow group members. During the psycho-ed portion of the group, the patient did well with the CBT activity. The patient chose a situation where her husband was complaining early in the morning. The patient seemed to understand this activity and its  usefulness for long term recovery.    UDS collected: No  AA/NA attended?: Yes, Thursday evening  Sponsor?: Yes   Brandon Melnick, LCAS 07/26/2018 8:32 AM

## 2018-07-26 NOTE — Progress Notes (Signed)
Patient ID: Crystal Mcknight, female   DOB: 1976/01/23, 43 y.o.   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 Now anticipating 90 days sober from what she has heard others say. She admits she is on the "emotional roller coaster" of early sobriety complicated by the loss of her beloved dog. The dog had been out running at their [place] retreat,came in went to sleep and died peacefully.Admits she thought of drinking but says not so much that as wanting change her feelings. Pt is beginning to discover her true personality prefers human being to human doing which she had become as her alcoholism progressed. She was the leader in all the family activities,the go to person for everyone and now she finds she prefers to have some time by herself an not to be busy all the time with no tome for reflection nd meditation. This change in her has brought about a need to have her "doing " husband b4egin to recognize her and to acknowledge her needs for him to "slow down" in his approach to their relationship. "He's going have to change".  She continues to take her low dose Zoloft and finds it effective.Another reason she gives for not going higher is that she wants to feel her feelings. She plans to continue counseling here at discharge.She has gne back to Trazodone for sleep without diaphoresis.  Review of Systems: Psychiatric: Agitation: expected emotions of early sobriety/GAD 7 score 5 not difficult Hallucination: No Depressed Mood: PHQ 9 improved Score is 4 low level Insomnia:Rx Trazodone Hypersomnia: No Altered Concentration: No Feels Worthless: No Grandiose Ideas: No Belief In Special Powers: No New/Increased Substance Abuse: No Compulsions: Declining in relationships/No cravings/alcohol obsession    Neurologic: Headache: No Seizure: No Paresthesias: No  Current Medications: Take 1 tablet by mouth daily.      Marland Kitchen SYNTHROID 175 MCG tablet     . triamcinolone cream (KENALOG) 0.1 % Apply 1 application topically 2 (two) times daily. 30 g 0  . Vitamins/Minerals TABS Take by mouth.    Marland Kitchen albuterol (VENTOLIN HFA) 108 (90 Base) MCG/ACT inhaler 1-2 inhalations every 4-6 hours as needed for wheezing. Dispense spacer as needed.    Marland Kitchen azithromycin (ZITHROMAX) 250 MG tablet Take 1 tablet (250 mg total) by mouth daily. Take first 2 tablets together, then 1 every day until finished. (Patient not taking: Reported on 06/05/2018) 6 tablet 0  . fluconazole (DIFLUCAN) 150 MG tablet Take 1 tablet (150 mg total) by mouth daily. (Patient not taking: Reported on 06/05/2018) 2 tablet 0  . fluticasone (FLONASE) 50 MCG/ACT nasal spray Place into both nostrils daily.    . hydrOXYzine (ATARAX/VISTARIL) 25 MG tablet Take 0.5-2 tablets (12.5-50 mg total) by mouth every 6 (six) hours as needed for anxiety or itching. 30 tablet 0  . ibuprofen (ADVIL,MOTRIN) 400 MG tablet Take 400 mg by mouth every 6 (six) hours as needed.    Marland Kitchen QUEtiapine (SEROQUEL) 25 MG tablet Take 1 tablet (25 mg total) by mouth at bedtime as needed and may repeat dose one time if needed. 60 tablet 2  . sertraline (ZOLOFT) 50 MG tablet Take 1 tablet (50 mg total     Mental Status Examination  Appearance: Alert: Yes Attention: good  Cooperative: Yes Eye Contact: Good Speech: Clear and coherent Psychomotor Activity: Normal Memory/Concentration: Normal/intact Oriented: person,  place, time/date and situation Mood:Full range Affect: Appropriate and Congruent Thought Processes and Associations: Coherent and Intact Fund of Knowledge: Good Thought Content: WDL Insight: Improving Judgement: Improving  IEP:PIRJJOAC  Diagnosis:   Alcohol use disorder, severe, dependence (HCC) F10.20      2. Family history of alcoholism in father  Z63.72   3. Dysthymia (or depressive neurosis) F34.1   4. Chronic anxiety F41.9   5. Delayed emotional development F88    Adult child of alcoholic/dysfunctional family  6. Unexplained night sweats R61   7. Acquired hypothyroidism E03.9   8. PTSD (post-traumatic stress disorder) F43.10   9. Medication side effect, initial encounter      Assessment:Early remission chronic alcoholism  Treatment Plan: Continue per Initial Evaluation.Plan discharge next week Darlyne Russian, PA-C

## 2018-07-27 ENCOUNTER — Encounter (HOSPITAL_COMMUNITY): Payer: Self-pay | Admitting: Psychology

## 2018-07-27 ENCOUNTER — Other Ambulatory Visit (HOSPITAL_COMMUNITY): Payer: BLUE CROSS/BLUE SHIELD | Admitting: Psychology

## 2018-07-27 DIAGNOSIS — F1020 Alcohol dependence, uncomplicated: Secondary | ICD-10-CM | POA: Diagnosis not present

## 2018-07-27 DIAGNOSIS — F419 Anxiety disorder, unspecified: Secondary | ICD-10-CM

## 2018-07-27 NOTE — Progress Notes (Addendum)
    Daily Group Progress Note  Program: CD-IOP   08/01/2018 [name] [id number]  Diagnosis:  Alcohol use disorder, severe, dependence (HCC)  Chronic anxiety  Stage of Change: Action, Maintenance    Sobriety Date: 12/5  Group Time: 1-2:30  Participation Level: Active  Behavioral Response: Appropriate and Sharing  Type of Therapy: Psycho-education Group  Interventions: CBT  Topic: Process: The first part of group began with process. Group members shared their experiences in early recovery since we last met on Wednesday. Two group members were absent. One group member was on vacation while another group member got confused about the days of the week CD-IOP meets. No drug tests were collected.      Group Time: 2:30-4  Participation Level: Active  Behavioral Response: Appropriate and Sharing  Type of Therapy: Psycho-education Group  Interventions: CBT  Topic: Psycho-ed: Internal Triggers; The second part of group was spent in psycho-ed discussing internal triggers for use. Group members were encouraged to first discuss the difference between external and internal triggers. A handout was passed around with a list of emotional states. Group members were instructed to place a check by all the emotions that are internal triggers for using. Group members were instructed to then select their three most triggering emotional states. Group members processed ways of coping with these challenging emotional states with the group.    Summary: The patient reported that they had attended one [place] meeting since we last met on Wednesday. The patient reported that she has been maintaining "status quo" for the last few days. The patient shared that her husband left this morning and won't be back until Sunday. The patient shared that tonight her son has an elementary school dance that he is looking forward to too. During the psycho-ed portion of group, the patient shared that her biggest  internal triggers are greif, sadness, and irritation.    UDS collected: Yes Results: negative  AA/NA attended?: YesThursday  Sponsor?: Yes  [name], [facility] [place] 08/01/2018 3:21 PM

## 2018-07-27 NOTE — Progress Notes (Signed)
    Daily Group Progress Note  Program: CD-IOP   07/27/2018 [name] [id number]  Diagnosis:  Alcohol use disorder, severe, dependence (HCC)  Chronic anxiety  Stage of Change: Action  Sobriety Date: 12/5  Group Time: 1-2:30  Participation Level: Active  Behavioral Response: Appropriate and Sharing  Type of Therapy: Process Group  Interventions: CBT  Topic: PTs were active and engaged in group processing session. Counselor facilitated CBT-based and emotion-focused processing questions to help PTs discuss their recovery from mind-altering drugs/alcohol. Emphasis was placed on PTs disclosing their challenges and successes pertaining to their treatment plan goals.      Group Time: 2:30-4  Participation Level: Active  Behavioral Response: Appropriate and Sharing  Type of Therapy: Psycho-education Group  Interventions: Meditation: Chair Yoga  Topic: Patients were led in a 1 hour chair yoga session. Pts were guided in various positions that can be adapted for relaxation at work, home, and on the go.    Summary: PT was visibly shaken upon entry into room and quickly checked in that her dog died over the weekend, unexpectedly. She processed her reaction and her family's response to this tragedy w/ the group. Group offered support. PT stated "this was the first weekend I really wanted to drink since beginning recovery". Pt attended AA that morning and felt comfortable sharing her thoughts w/ her sponsor. Pt participated actively in chair yoga.   UDS collected: No Results: Most recent 2/27 negative  AA/NA attended?: [facility]  Sponsor?: Yes   [name], [facility] [place] 07/27/2018 6:31 PM

## 2018-07-28 DIAGNOSIS — Z13228 Encounter for screening for other metabolic disorders: Secondary | ICD-10-CM | POA: Diagnosis not present

## 2018-07-28 DIAGNOSIS — Z1329 Encounter for screening for other suspected endocrine disorder: Secondary | ICD-10-CM | POA: Diagnosis not present

## 2018-07-28 DIAGNOSIS — Z1321 Encounter for screening for nutritional disorder: Secondary | ICD-10-CM | POA: Diagnosis not present

## 2018-07-28 DIAGNOSIS — Z13220 Encounter for screening for lipoid disorders: Secondary | ICD-10-CM | POA: Diagnosis not present

## 2018-07-31 ENCOUNTER — Encounter (HOSPITAL_COMMUNITY): Payer: Self-pay | Admitting: Medical

## 2018-07-31 ENCOUNTER — Other Ambulatory Visit (HOSPITAL_COMMUNITY): Payer: BLUE CROSS/BLUE SHIELD | Admitting: Psychology

## 2018-07-31 DIAGNOSIS — F1020 Alcohol dependence, uncomplicated: Secondary | ICD-10-CM

## 2018-07-31 DIAGNOSIS — F419 Anxiety disorder, unspecified: Secondary | ICD-10-CM

## 2018-08-01 NOTE — Progress Notes (Signed)
    Daily Group Progress Note  Program: CD-IOP   08/01/2018 Crystal Mcknight [id number]  Diagnosis:  Alcohol use disorder, severe, dependence (HCC)  Chronic anxiety  PTSD (post-traumatic stress disorder)  Family history of alcoholism in father  Dysthymia (or depressive neurosis)  Delayed emotional development  Acquired hypothyroidism  Medication side effect, initial encounter  Unexplained night sweats  Substance induced mood disorder (HCC)  Stage of Change: Action, Maintenance   Sobriety Date: 12/5  Group Time: 1-2:30  Participation Level: Active  Behavioral Response: Appropriate and Sharing  Type of Therapy: Process Group  Interventions: CBT  Topic: PTs were active and engaged in group processing session. Counselor facilitated CBT-based and emotion-focused processing questions to help PTs discuss their recovery from mind-altering drugs/alcohol. Emphasis was placed on PTs disclosing their challenges and successes pertaining to their treatment plan goals. One member was present after being sick last group. Another member was away on a prescheduled trip. UDS results were provided and collected from some members. One new member was present for group and shared openly about his past and hx of addiction to cocaine.       Group Time: 2:30-4  Participation Level: Active  Behavioral Response: Appropriate and Sharing  Type of Therapy: Psycho-education Group  Interventions: CBT  Topic: Patients were led in a 1 hour psychoeducation session on the "4 Most Common Reasons For Relapse In Early Recovery". PTs were educated on internal and external triggers, europhoric recall, physical pain, and desire for numbness of emotional pain.    Summary: PT was active, engaged, attended 3 AA meetings since last group. PT was happy to report she is now 90 days sober. She continues to struggle w/ anxiety when confronted by her family members about her past. PT states her  relationship w/ her daughter is improving remarkably since she has gotten out of residential tx. PT continues to call her sponsor daily.    UDS collected: No Results: negative  AA/NA attended?: YesWednesday  Sponsor?: Yes   Crystal Mcknight, [facility] 08/01/2018 1:15 PM

## 2018-08-02 ENCOUNTER — Other Ambulatory Visit (HOSPITAL_COMMUNITY): Payer: Self-pay | Admitting: Medical

## 2018-08-02 ENCOUNTER — Encounter (HOSPITAL_COMMUNITY): Payer: Self-pay | Admitting: Medical

## 2018-08-02 ENCOUNTER — Other Ambulatory Visit (INDEPENDENT_AMBULATORY_CARE_PROVIDER_SITE_OTHER): Payer: BLUE CROSS/BLUE SHIELD | Admitting: Psychology

## 2018-08-02 ENCOUNTER — Other Ambulatory Visit: Payer: Self-pay

## 2018-08-02 ENCOUNTER — Encounter (HOSPITAL_COMMUNITY): Payer: Self-pay | Admitting: Psychology

## 2018-08-02 DIAGNOSIS — F88 Other disorders of psychological development: Secondary | ICD-10-CM

## 2018-08-02 DIAGNOSIS — M25562 Pain in left knee: Secondary | ICD-10-CM | POA: Diagnosis not present

## 2018-08-02 DIAGNOSIS — Z6372 Alcoholism and drug addiction in family: Secondary | ICD-10-CM

## 2018-08-02 DIAGNOSIS — F1020 Alcohol dependence, uncomplicated: Secondary | ICD-10-CM | POA: Diagnosis not present

## 2018-08-02 DIAGNOSIS — E039 Hypothyroidism, unspecified: Secondary | ICD-10-CM

## 2018-08-02 DIAGNOSIS — F1994 Other psychoactive substance use, unspecified with psychoactive substance-induced mood disorder: Secondary | ICD-10-CM

## 2018-08-02 DIAGNOSIS — Z811 Family history of alcohol abuse and dependence: Secondary | ICD-10-CM

## 2018-08-02 DIAGNOSIS — F419 Anxiety disorder, unspecified: Secondary | ICD-10-CM

## 2018-08-02 DIAGNOSIS — F4310 Post-traumatic stress disorder, unspecified: Secondary | ICD-10-CM

## 2018-08-02 DIAGNOSIS — F341 Dysthymic disorder: Secondary | ICD-10-CM

## 2018-08-02 DIAGNOSIS — M238X2 Other internal derangements of left knee: Secondary | ICD-10-CM | POA: Diagnosis not present

## 2018-08-02 MED ORDER — SERTRALINE HCL 50 MG PO TABS: 50.0000 mg | ORAL_TABLET | Freq: Every day | ORAL | 1 refills | Status: DC

## 2018-08-02 MED ORDER — TRAZODONE HCL 50 MG PO TABS: ORAL_TABLET | ORAL | 1 refills | Status: AC

## 2018-08-02 NOTE — Progress Notes (Signed)
6 month rx of meds ordered

## 2018-08-02 NOTE — Progress Notes (Signed)
    Daily Group Progress Note  Program: CD-IOP   08/02/2018 [name] [id number]  Diagnosis:  Alcohol use disorder, severe, dependence (HCC)  Chronic anxiety  Stage of Change: Action, Maintenance  Sobriety Date: 12/5  Group Time: 1-2:30  Participation Level: Active  Behavioral Response: Appropriate and Sharing  Type of Therapy: Process Group  Interventions: CBT  Topic: PTs were active and engaged in group processing session. Counselor facilitated CBT-based and emotion-focused processing questions to help PTs discuss their recovery from mind-altering drugs/alcohol. Emphasis was placed on PTs disclosing their challenges and successes pertaining to their treatment plan goals. Multiple members were out due to sickness. One new member started group and met w/ medical director to establish care. UDS samples were collected from 4 members.       Group Time: 2:30-4  Participation Level: Active  Behavioral Response: Appropriate and Sharing  Type of Therapy: Psycho-education Group  Interventions: CBT  Topic: Patients were led in a 1 hour psychoeducation session on how to utilize a breathing technique to calm anxiety/cravings. PTs were instructed to use acronym SOBER; Stop, Observe, Breathe, Expand, Respond. PTs were taught how to use mindfulness to respond to their discomfort, stress. A handout was provided for PTs to read through and take home to practice.     Summary: PT was alert, engaged, and shared somewhat emotionally about her stressors from the weekend. She continues to report frustration w/ her husband who has also stopped drinking and is chronically frustrated, irritated, and uses passive aggressive communication w/ PT and the kids. She reports her family got a new puppy which is both stressful and joyful. PT reported to group that she was proud of herself for confronting her S/O about his anger and asked him to "do better". PT was getting anxious in session so  counselor led her through the SOBER breathing exercise while teaching the other members how to utilize it in stressful situations. PT attended 4 AA meetings since last group.    UDS collected: No Results: negative  AA/NA attended?: YesMonday, Friday, Saturday and Sunday  Sponsor?: Yes  Adelfa Koh [facility] 08/02/2018 9:17 AM

## 2018-08-02 NOTE — Progress Notes (Addendum)
Patient ID: Crystal Mcknight, female   DOB: 07-30-1975, 43 y.o.   MRN: [id number]                                                                                                                                                                                                                                                                                                                              [facility] Outpatient                                                                                                                                                                     CD-IOP DISCHARGE NOTE   Date of Admission: 06/01/2018 [place] Provider: [facility] [place] friend;former patient Date of Discharge: 08/03/2018 Sobriety Date: 04/29/2019 Admission Diagnosis: 1. Alcohol use disorder, severe, dependence ([place]) F10.20   2. Family history of alcoholism in father Z33.72   3. Dysthymia (or depressive neurosis) F34.1   4. Chronic anxiety F41.9   5. Delayed emotional development F88    Adult child of alcoholic/dysfunctional family  6. Unexplained night sweats R61   7. Acquired hypothyroidism E03.9   8. PTSD (post-traumatic stress disorder) F43.10   9. Medication side effect, initial encounter T50.905A  Course of Treatment: Patient struggled initially with Group but eventually found a way to be comfortable and participate in a meaningful way.In addition to Group therapy she had weekly sessions with Counselor.and Bi weekly FU with CDIOP Provider. She did not feel a need for MAT.She did have trouble with sleep and Trazodone initially caused night sweats which she eventually resolved. She continued on her chronic dose of Zoloft started years ago by PCP. PHQ 9 scores were low last being 4 basically negative for Depression. She felt anxiety has been more of an issue but scores on GAD 7 were low (2-6) and rated "not difficult" She chose AA as her outside support and has obtained a sponsor  and begun Step work. She is in process of self discovery.In her active addiction she was constantly going and doing -especially for others. In sobriety she has discovered she needs and enjoys quiet down time. Her husband has been a constant "doer" . She is now beginning to inform him that he will need to make changes as well in their relationship.  Medications: fluticasone 50 MCG/ACT nasal spray  Commonly known as: FLONASE  Place into both nostrils daily.   MULTIVITAMIN ADULT EXTRA C PO  multivitamin   sertraline 50 MG tablet  Commonly known as: ZOLOFT  Take 1 tablet (50 mg total) by mouth daily for 30 days.   Synthroid 175 MCG tablet  Generic drug: levothyroxine    traZODone 50 MG tablet  Commonly     Pt given 6 month supply of Zoloft and Trazodone Rx sent to [facility] on record   0 PTSD (post-traumatic stress disorder) `Alcohol use disorder, severe, dependence (HCC)  0  0 Family history of alcoholism in father  0 Chronic anxiety  0 Dysthymia (or depressive neurosis)  0 Delayed emotional development  0 Acquired hypothyroidism  0 Substance induced mood disorder   0 Plan of Action to Address Continuing Problems: Goals and Activities to Help Maintain Sobriety: 1. Stay away from people ,places and things that are triggers 2. Continue practicing Fair Fighting rules in interpersonal conflicts. 3. Continue alcohol and drug refusal skills and call on support systems  4. Attend AA/NA meetings AT LEAST as often as you drank  5. Continue with a sponsor and a home group in [place]. 6. Return to [place] for continued individual counseling  Referrals:NA  Aftercare services: Wednesdays 5:30 pm [facility]  Next appointment: Aftercare  Prognosis: Excellent if she continues recovery mainteanace    Client has participated in the development of this discharge plan and has received a copy of this completed plan      Rodman Pickle, PA-C

## 2018-08-03 ENCOUNTER — Other Ambulatory Visit (HOSPITAL_COMMUNITY): Payer: BLUE CROSS/BLUE SHIELD | Admitting: Psychology

## 2018-08-03 DIAGNOSIS — F1020 Alcohol dependence, uncomplicated: Secondary | ICD-10-CM | POA: Diagnosis not present

## 2018-08-03 DIAGNOSIS — F419 Anxiety disorder, unspecified: Secondary | ICD-10-CM

## 2018-08-03 DIAGNOSIS — F4310 Post-traumatic stress disorder, unspecified: Secondary | ICD-10-CM

## 2018-08-04 NOTE — Progress Notes (Signed)
CD-IOP THERAPIST DISCHARGE SUMMARY:  Crystal Mcknight    10-28-1975   DIAGNOSIS:    Alcohol use disorder, severe, dependence ([place])  PTSD (post-traumatic stress disorder)  Family history of alcoholism in father  Chronic anxiety  Dysthymia (or depressive neurosis)  Delayed emotional development - Adult Child of Alcoholic  Acquired hypothyroidism  Substance induced mood disorder ([place])  ADMISSION DATE: 06/01/18 DISCHARGE DATE:  08/03/18  REASON FOR DISCHARGE:  Successful completion of 24 sessions of CD-IOP  REASON FOR ADMISSION: Referred by friend and fellow graduate of CD-IOP after PT completed 28 day residential tx at [facility] for Alcohol Use Disorder, Severe.  CHEMICAL USE HISTORY: PT reports a long hx of alcohol use that escalated in recent years. She states she drank wine daily but also drank vodka daily w/o her family knowing. PT had agreed w/ her husband to stop drinking after Thanksgiving 2019. One week into their decision to go sober, PT's husbands discovered  her drinking alone in the garage. This led to PT going on a bender for 1 week that ultimately led to her seeking residential tx w/ the help of friends.  FAMILY OF ORIGIN ISSUES: PT reports her biological father is a recovering alcoholic. PT's parents were divorced when she was 43yo and PT was primarily raised by her mother and step father. Step father had 2 boys from a previous marriage that had severe behavioral and substance abuse problems that PT witnessed as a young girl. PT also reported a lengthy hx of loss and grief from deaths of friends and family members over past 10 years.   PROGRESS IN TREATMENT: PT made excellent progress in tx, became a natural leader of the group, established a home group she attended daily, secured a sponsor and began working steps. PT had a family session w/ her husband. PT did not report any relapses and tested negative for all UDS while in the program. She met or exceeded all tx goals.    PROGNOSIS: PT reports she feels more confident in her ability to handle difficult emotions and stress. PT has a strong recovery program that she works daily and chance of continued success is good.       Comments:  na  [name], [facility], LCASA

## 2018-08-05 ENCOUNTER — Encounter (HOSPITAL_COMMUNITY): Payer: Self-pay | Admitting: Psychology

## 2018-08-05 NOTE — Progress Notes (Signed)
    Daily Group Progress Note  Program: CD-IOP   08/05/2018 [name] [id number]  Diagnosis: Alcohol Use Disorder, Severe, Chronic PTSD  Sobriety Date: 04/27/18  Group Time: 1-2:30pm  Participation Level: Active  Behavioral Response: Sharing  Type of Therapy: Process Group  Interventions: Supportive  Topic: Process: The first part of group began with process. Group members shared their experiences in early recovery since we last met on Wednesday. Group members participated in a popsicle stick check-in activity. One group member was absent due to being sick. No drug tests were collected.   Group Time: 2:30-4pm  Participation Level: Minimal  Behavioral Response: Appropriate  Type of Therapy: Psycho-Education  Interventions: CBT  Topic: Psycho-ed: Self Compassion & Graduation; The second half of group was spent in psycho-ed watching the Ted Talk by Marilynn Latino on self-compassion. Group members watched the video together and processed their reactions afterwards. During the latter part of psycho-ed, a graduation ceremony was held for a group member who successfully completed CD-IOP.  Summary: The patient reported that she attended one [place] meeting since we last met. The patient processed feeling "panicky" over this being her last group session. She also shared feelings of an "itchiness" and "restlessness." The patient reported that she socialized with AA members outside of a meeting in a restaurant setting. The patient processed learning to find places in the world where she can talk about "real shit" when CD IOP is over. The patient's popsicle stick check in word was "mindfulness." The patient processed how mindfulness takes time and is learning to observe cravings and thoughts of drinking without acting on them. During the psycho-ed portion of group, the patient attentively watched the Ted Talk video and participated in the group discussion afterwards.   UDS collected:  No  AA/NA attended? Yes, Thursday evening  Sponsor?: Yes   Brandon Melnick, LCAS 08/05/2018 5:44 PM

## 2018-08-07 ENCOUNTER — Other Ambulatory Visit (HOSPITAL_COMMUNITY): Payer: BLUE CROSS/BLUE SHIELD

## 2018-08-09 ENCOUNTER — Other Ambulatory Visit (HOSPITAL_COMMUNITY): Payer: BLUE CROSS/BLUE SHIELD

## 2018-08-10 ENCOUNTER — Other Ambulatory Visit (HOSPITAL_COMMUNITY): Payer: BLUE CROSS/BLUE SHIELD

## 2018-08-10 NOTE — Progress Notes (Signed)
OUTPATIENT THERAPIST DISCHARGE SUMMARY:  Crystal Mcknight    09/06/1975   DIAGNOSIS:    Alcohol use disorder, severe, dependence ([place])  PTSD (post-traumatic stress disorder)  Chronic anxiety  ADMISSION DATE: 06/01/18 DISCHARGE DATE:  08/03/18  REASON FOR DISCHARGE:  Successful Completion of 24 sessions of CD-IOP  REASON FOR ADMISSION: Referred by friend and fellow CD-IOP graduate after PT completed successful 28 day residential tx at [facility] to address Alcohol Use Disorder, Severe.  CHEMICAL USE HISTORY: From [facility], taken 05/29/18, PT reported:  "Name of Substance 1: Alcohol- Wine, Vodka 1 - Age of First Use: 10- Exposed to it by older brothers 1 - Amount (size/oz): 6-10 standard drinks  1 - Frequency: daily 1 - Duration: past 2 years 1 - Last Use / Amount: 04/27/18 Substance #2 Name of Substance 2: Marijuana- Edible 2 - Age of First Use: 11 2 - Amount (size/oz): "not much" 2 - Frequency: "got progressively worse towards end of 2019, but mostly occassionally" 2 - Duration: Last 1-2 years 2 - Last Use / Amount: 04/27/18 Substance #3 Name of Substance 3: Cocaine 3 - Age of First Use: 16 3 - Amount (size/oz): "don't remember" 3 - Frequency: "don't remember" 3 - Duration: "Only during college during parties or concerts" 3 - Last Use / Amount: 20 years ago Substance #4 Name of Substance 4: Ecstasy 4 - Age of First Use: 16 4 - Amount (size/oz): "don't remember" 4 - Frequency: "occassionally at parties and concerts" 4 - Duration: "only during college 4 - Last Use / Amount: 20 years ago "  FAMILY OF ORIGIN ISSUES: PT reports her biological father is a sober alcoholic though she did not grow up w/ him. She lived w/ her mother and stepfather since the age of 7. PT reports she was exposed to significant behavioral and emotional outbursts by her older step brothers. She reported a long hx of grief and loss, after losing more than 10 people to death, disease, and accidents in past  10 years. Most recently, PT's older stepbrother died of alcoholism in March 02, 2018.  PROGRESS IN TREATMENT: PT made excellent progress and became a leader of the group. She shared vulnerably about her labile emotions, irritability, and resentments. She offered helpful feedback to all group members. She attended at least 4 AA meetings weekly, secured a sponsor quickly upon entry, and quoted 12 Step phrases regularly. She struggled w/ her emotions towards her husband and their strained relationship. PT tested negative for all UDS in tx. PT continued her Zoloft as rx by her PCP and did not elect to start MAT for cravings while in our care.  PROGNOSIS: Good. PT has all tools and knowledge of disease of addiction. She intends to continue in the [facility] and working steps w/ her sponsor. She wants to continue counseling individually and begin to seek couples counselor for her marriage.       Comments:  NA  [name], LCMHCA, LCASA

## 2018-08-14 ENCOUNTER — Other Ambulatory Visit (HOSPITAL_COMMUNITY): Payer: BLUE CROSS/BLUE SHIELD

## 2018-08-16 ENCOUNTER — Other Ambulatory Visit (HOSPITAL_COMMUNITY): Payer: BLUE CROSS/BLUE SHIELD

## 2018-08-17 ENCOUNTER — Other Ambulatory Visit (HOSPITAL_COMMUNITY): Payer: BLUE CROSS/BLUE SHIELD

## 2018-08-21 ENCOUNTER — Other Ambulatory Visit (HOSPITAL_COMMUNITY): Payer: BLUE CROSS/BLUE SHIELD

## 2018-08-23 ENCOUNTER — Other Ambulatory Visit (HOSPITAL_COMMUNITY): Payer: BLUE CROSS/BLUE SHIELD

## 2018-08-24 ENCOUNTER — Other Ambulatory Visit (HOSPITAL_COMMUNITY): Payer: BLUE CROSS/BLUE SHIELD

## 2018-08-28 ENCOUNTER — Other Ambulatory Visit (HOSPITAL_COMMUNITY): Payer: BLUE CROSS/BLUE SHIELD

## 2018-08-30 ENCOUNTER — Other Ambulatory Visit (HOSPITAL_COMMUNITY): Payer: BLUE CROSS/BLUE SHIELD

## 2018-08-31 ENCOUNTER — Other Ambulatory Visit (HOSPITAL_COMMUNITY): Payer: BLUE CROSS/BLUE SHIELD

## 2018-11-30 DIAGNOSIS — Z03818 Encounter for observation for suspected exposure to other biological agents ruled out: Secondary | ICD-10-CM | POA: Diagnosis not present

## 2018-12-02 ENCOUNTER — Other Ambulatory Visit: Payer: Self-pay | Admitting: *Deleted

## 2018-12-02 DIAGNOSIS — Z20822 Contact with and (suspected) exposure to covid-19: Secondary | ICD-10-CM

## 2018-12-08 LAB — NOVEL CORONAVIRUS, NAA: SARS-CoV-2, NAA: NOT DETECTED

## 2019-02-28 ENCOUNTER — Other Ambulatory Visit (HOSPITAL_COMMUNITY): Payer: Self-pay | Admitting: Medical

## 2019-03-21 DIAGNOSIS — D1801 Hemangioma of skin and subcutaneous tissue: Secondary | ICD-10-CM | POA: Diagnosis not present

## 2019-03-21 DIAGNOSIS — L720 Epidermal cyst: Secondary | ICD-10-CM | POA: Diagnosis not present

## 2019-06-20 DIAGNOSIS — Z1329 Encounter for screening for other suspected endocrine disorder: Secondary | ICD-10-CM | POA: Diagnosis not present

## 2019-07-07 ENCOUNTER — Ambulatory Visit: Payer: BC Managed Care – PPO | Attending: Internal Medicine

## 2019-07-07 DIAGNOSIS — Z23 Encounter for immunization: Secondary | ICD-10-CM | POA: Insufficient documentation

## 2019-07-07 NOTE — Progress Notes (Signed)
   Covid-19 Vaccination Clinic  Name:  Crystal Mcknight    MRN: [id number] DOB: 06/15/1975  07/07/2019  [name] was observed post Covid-19 immunization for  15 minutes without incidence. She was provided with Vaccine Information Sheet and instruction to access the V-Safe system.   [name] was instructed to call 911 with any severe reactions post vaccine: Marland Kitchen Difficulty breathing  . Swelling of your face and throat  . A fast heartbeat  . A bad rash all over your body  . Dizziness and weakness    Immunizations Administered    Name Date Dose VIS Date Route   Pfizer COVID-19 Vaccine 07/07/2019  9:48 AM 0.3 mL 05/04/2019 Intramuscular   Manufacturer: [place]   Lot: [id number]   [place]: [id number]

## 2019-07-09 ENCOUNTER — Other Ambulatory Visit: Payer: Self-pay

## 2019-07-09 ENCOUNTER — Encounter: Payer: Self-pay | Admitting: Emergency Medicine

## 2019-07-09 ENCOUNTER — Ambulatory Visit: Payer: BLUE CROSS/BLUE SHIELD | Admitting: Emergency Medicine

## 2019-07-09 VITALS — BP 112/71 | HR 75 | Temp 98.0°F | Resp 16 | Ht 66.5 in | Wt 133.0 lb

## 2019-07-09 DIAGNOSIS — H8110 Benign paroxysmal vertigo, unspecified ear: Secondary | ICD-10-CM | POA: Diagnosis not present

## 2019-07-09 MED ORDER — MECLIZINE HCL 25 MG PO TABS: 25.0000 mg | ORAL_TABLET | Freq: Three times a day (TID) | ORAL | 0 refills | Status: DC | PRN

## 2019-07-09 NOTE — Progress Notes (Signed)
Crystal Mcknight 44 y.o.   Chief Complaint  Patient presents with  . Headache    started Friday   . Dizziness    started Saturday    HISTORY OF PRESENT ILLNESS: This is a 44 y.o. female complaining of positional vertigo that started 3 days ago with a headache.  Similar episode 11 years ago when she was in [place].  Denies history of migraine headaches.  Denies visual problems.  Denies nausea or vomiting.  Denies fever or flulike symptoms.  Has history of severe hypothyroidism and recently had her Synthroid dose increased to 200 mcg by her gynecologist.  Referred to endocrinologist.  Has appointment in 2 days.  Non-smoker and no EtOH user.  Not taking any new medication. Denies syncope or any other neurological symptom.  No gait disturbance.  Took some Advil with headache relief.  Denies any other significant associated symptoms.  Motion and head movement makes symptoms much worse.  HPI   Prior to Admission medications   Medication Sig Start Date End Date Taking? Authorizing Provider  fluticasone (FLONASE) 50 MCG/ACT nasal spray Place into both nostrils daily.   Yes [provider]  Multiple Vitamins-Minerals (MULTIVITAMIN ADULT EXTRA C PO) multivitamin   Yes [provider]  SYNTHROID 175 MCG tablet  01/17/18  Yes [provider]  traZODone (DESYREL) 50 MG tablet Take at bedtime as directed 08/02/18  Yes Dara Hoyer, PA-C  sertraline (ZOLOFT) 50 MG tablet Take 1 tablet (50 mg total) by mouth daily. 08/02/18 01/29/19  Dara Hoyer, PA-C    Allergies  Allergen Reactions  . Penicillins Rash    When she was 44years old, had a rash when took this for strep throat    Patient Active Problem List   Diagnosis Date Noted  . Hypothyroidism 06/05/2018  . PTSD (post-traumatic stress disorder) 06/05/2018  . 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 Degenerative arthritis of finger 09/28/2012    Past Medical History:  Diagnosis Date  . Arthritis    hands  . Depression    . Hypothyroidism   . Pelvic pain   . PONV (postoperative nausea and vomiting)   . SVD (spontaneous vaginal delivery)    x 2  . Thyroid disease     Past Surgical History:  Procedure Laterality Date  . APPENDECTOMY    . LAPAROSCOPY N/A 05/31/2013   Procedure: LAPAROSCOPY OPERATIVE, FULGURATION OF ENDOMETRIOSIS, REMOVAL OF BILATERAL FALLOPIAN TUBE CYST;  Surgeon: Marylynn Pearson, MD;  Location: [facility];  Service: Gynecology;  Laterality: N/A;  . RHINOPLASTY    . WISDOM TOOTH EXTRACTION      Social History   Socioeconomic History  . Marital status: Married    Spouse name: Not on file  . Number of children: Not on file  . Years of education: Not on file  . Highest education level: Not on file  Occupational History  . Not on file  Tobacco Use  . Smoking status: Never Smoker  . Smokeless tobacco: Never Used  Substance and Sexual Activity  . Alcohol use: Yes    Alcohol/week: 7.0 - 14.0 standard drinks    Types: 7 - 14 Glasses of wine per week    Comment: 1-2 glasses wind daily  . Drug use: No  . Sexual activity: Yes    Birth control/protection: None    Comment: husband vasectomy  Other Topics Concern  . Not on file  Social History Narrative  . Not on file   Social Determinants of Health   Financial Resource  Strain:   . Difficulty of Paying Living Expenses: Not on file  Food Insecurity:   . Worried About Charity fundraiser in the Last Year: Not on file  . Ran Out of Food in the Last Year: Not on file  Transportation Needs:   . Lack of Transportation (Medical): Not on file  . Lack of Transportation (Non-Medical): Not on file  Physical Activity:   . Days of Exercise per Week: Not on file  . Minutes of Exercise per Session: Not on file  Stress:   . Feeling of Stress : Not on file  Social Connections:   . Frequency of Communication with Friends and Family: Not on file  . Frequency of Social Gatherings with Friends and Family: Not on file  . Attends Religious Services:  Not on file  . Active Member of Clubs or Organizations: Not on file  . Attends Archivist Meetings: Not on file  . Marital Status: Not on file  Intimate Partner Violence:   . Fear of Current or Ex-Partner: Not on file  . Emotionally Abused: Not on file  . Physically Abused: Not on file  . Sexually Abused: Not on file    Family History  Problem Relation Age of Onset  . Cancer Maternal Grandmother   . Heart disease Maternal Grandfather   . Cancer Paternal Grandmother   . Heart disease Paternal Grandfather   . Cancer Mother   . Hyperlipidemia Mother   . Hyperlipidemia Father   . Hypertension Father      Review of Systems  Constitutional: Positive for malaise/fatigue. Negative for chills and fever.       Hair loss  HENT: Negative.  Negative for congestion and sore throat.   Eyes: Negative.  Negative for blurred vision and double vision.  Respiratory: Negative.  Negative for cough and shortness of breath.   Cardiovascular: Negative.  Negative for chest pain and palpitations.  Gastrointestinal: Negative.  Negative for abdominal pain, blood in stool, diarrhea, melena, nausea and vomiting.  Genitourinary: Negative.  Negative for dysuria and hematuria.  Musculoskeletal: Negative.  Negative for back pain, myalgias and neck pain.  Skin: Negative for rash.  Neurological: Positive for dizziness and headaches. Negative for sensory change, speech change, focal weakness, seizures and loss of consciousness.  Endo/Heme/Allergies: Negative.   All other systems reviewed and are negative.  Today's Vitals   07/09/19 1606  BP: 112/71  Pulse: 75  Resp: 16  Temp: 98 F (36.7 C)  TempSrc: Temporal  SpO2: 100%  Weight: 133 lb (60.3 kg)  Height: 5' 6.5" (1.689 m)   Body mass index is 21.15 kg/m.   Physical Exam Vitals reviewed.  Constitutional:      Appearance: Normal appearance.  HENT:     Head: Normocephalic.     Right Ear: Tympanic membrane, ear canal and external ear  normal.     Left Ear: Tympanic membrane, ear canal and external ear normal.  Eyes:     Extraocular Movements: Extraocular movements intact.     Conjunctiva/sclera: Conjunctivae normal.     Pupils: Pupils are equal, round, and reactive to light.  Neck:     Vascular: No carotid bruit.  Cardiovascular:     Rate and Rhythm: Normal rate.     Pulses: Normal pulses.     Heart sounds: Normal heart sounds.  Pulmonary:     Effort: Pulmonary effort is normal.     Breath sounds: Normal breath sounds.  Musculoskeletal:  General: Normal range of motion.     Cervical back: Normal range of motion and neck supple. No tenderness.  Lymphadenopathy:     Cervical: No cervical adenopathy.  Skin:    General: Skin is warm and dry.     Capillary Refill: Capillary refill takes less than 2 seconds.  Neurological:     General: No focal deficit present.     Mental Status: She is alert and oriented to person, place, and time.     Cranial Nerves: No cranial nerve deficit.     Sensory: No sensory deficit.     Motor: No weakness.     Coordination: Coordination normal.     Gait: Gait normal.  Psychiatric:        Mood and Affect: Mood normal.        Behavior: Behavior normal.    A total of 30 minutes was spent with the patient, greater than 50% of which was in counseling/coordination of care regarding differential diagnosis of vertigo, treatment and management including medication such as meclizine, need for diagnostic blood work, prognosis, and need for follow-up.   ASSESSMENT & PLAN: Crystal Mcknight was seen today for headache and dizziness.  Diagnoses and all orders for this visit:  Benign paroxysmal positional vertigo, unspecified laterality -     CBC with Differential/Platelet -     Comprehensive metabolic panel -     Hemoglobin A1c -     Lipid panel -     Thyroid Panel With TSH -     Sedimentation Rate -     meclizine (ANTIVERT) 25 MG tablet; Take 1 tablet (25 mg total) by mouth 3 (three) times  daily as needed for dizziness.    Patient Instructions       If you have lab work done today you will be contacted with your lab results within the next 2 weeks.  If you have not heard from [place] then please contact us. The fastest way to get your results is to register for My Chart.   IF you received an x-ray today, you will receive an invoice from [facility]. Please contact [facility] at [phone] with questions or concerns regarding your invoice.   IF you received labwork today, you will receive an invoice from [facility] . Please contact [facility] at [phone] with questions or concerns regarding your invoice.   Our billing staff will not be able to assist you with questions regarding bills from these companies.  You will be contacted with the lab results as soon as they are available. The fastest way to get your results is to activate your My Chart account. Instructions are located on the last page of this paperwork. If you have not heard from [place] regarding the results in 2 weeks, please contact this office.     Vertigo Vertigo is the feeling that you or the things around you are moving when they are not. This feeling can come and go at any time. Vertigo often goes away on its own. This condition can be dangerous if it happens when you are doing activities like driving or working with machines. Your doctor will do tests to find the cause of your vertigo. These tests will also help your doctor decide on the best treatment for you. Follow these instructions at home: Eating and drinking      Drink enough fluid to keep your pee (urine) pale yellow.  Do not drink alcohol. Activity  Return to your normal activities as told by your doctor.  Ask your doctor what activities are safe for you.  In the morning, first sit up on the side of the bed. When you feel okay, stand slowly while you hold onto something until you know that your balance is fine.  Move slowly.  Avoid sudden body or head movements or certain positions, as told by your doctor.  Use a cane if you have trouble standing or walking.  Sit down right away if you feel dizzy.  Avoid doing any tasks or activities that can cause danger to you or others if you get dizzy.  Avoid bending down if you feel dizzy. Place items in your home so that they are easy for you to reach without leaning over.  Do not drive or use heavy machinery if you feel dizzy. General instructions  Take over-the-counter and prescription medicines only as told by your doctor.  Keep all follow-up visits as told by your doctor. This is important. Contact a doctor if:  Your medicine does not help your vertigo.  You have a fever.  Your problems get worse or you have new symptoms.  Your family or friends see changes in your behavior.  The feeling of being sick to your stomach gets worse.  Your vomiting gets worse.  You lose feeling (have numbness) in part of your body.  You feel prickling and tingling in a part of your body. Get help right away if:  You have trouble moving or talking.  You are always dizzy.  You pass out (faint).  You get very bad headaches.  You feel weak in your hands, arms, or legs.  You have changes in your hearing.  You have changes in how you see (vision).  You get a stiff neck.  Bright light starts to bother you. Summary  Vertigo is the feeling that you or the things around you are moving when they are not.  Your doctor will do tests to find the cause of your vertigo.  You may be told to avoid some tasks, positions, or movements.  Contact a doctor if your medicine is not helping, or if you have a fever, new symptoms, or a change in behavior.  Get help right away if you get very bad headaches, or if you have changes in how you speak, hear, or see. This information is not intended to replace advice given to you by your health care provider. Make sure you discuss any  questions you have with your health care provider. Document Revised: 04/03/2018 Document Reviewed: 04/03/2018 Elsevier Patient Education  2020 Elsevier Inc.      Agustina Caroli, MD [facility]

## 2019-07-09 NOTE — Patient Instructions (Addendum)
If you have lab work done today you will be contacted with your lab results within the next 2 weeks.  If you have not heard from [place] then please contact us. The fastest way to get your results is to register for My Chart.   IF you received an x-ray today, you will receive an invoice from [facility]. Please contact [facility] at [phone] with questions or concerns regarding your invoice.   IF you received labwork today, you will receive an invoice from [facility]. Please contact [facility] at [phone] with questions or concerns regarding your invoice.   Our billing staff will not be able to assist you with questions regarding bills from these companies.  You will be contacted with the lab results as soon as they are available. The fastest way to get your results is to activate your My Chart account. Instructions are located on the last page of this paperwork. If you have not heard from [place] regarding the results in 2 weeks, please contact this office.     Vertigo Vertigo is the feeling that you or the things around you are moving when they are not. This feeling can come and go at any time. Vertigo often goes away on its own. This condition can be dangerous if it happens when you are doing activities like driving or working with machines. Your doctor will do tests to find the cause of your vertigo. These tests will also help your doctor decide on the best treatment for you. Follow these instructions at home: Eating and drinking      Drink enough fluid to keep your pee (urine) pale yellow.  Do not drink alcohol. Activity  Return to your normal activities as told by your doctor. Ask your doctor what activities are safe for you.  In the morning, first sit up on the side of the bed. When you feel okay, stand slowly while you hold onto something until you know that your balance is fine.  Move slowly. Avoid sudden body or head movements or certain positions, as  told by your doctor.  Use a cane if you have trouble standing or walking.  Sit down right away if you feel dizzy.  Avoid doing any tasks or activities that can cause danger to you or others if you get dizzy.  Avoid bending down if you feel dizzy. Place items in your home so that they are easy for you to reach without leaning over.  Do not drive or use heavy machinery if you feel dizzy. General instructions  Take over-the-counter and prescription medicines only as told by your doctor.  Keep all follow-up visits as told by your doctor. This is important. Contact a doctor if:  Your medicine does not help your vertigo.  You have a fever.  Your problems get worse or you have new symptoms.  Your family or friends see changes in your behavior.  The feeling of being sick to your stomach gets worse.  Your vomiting gets worse.  You lose feeling (have numbness) in part of your body.  You feel prickling and tingling in a part of your body. Get help right away if:  You have trouble moving or talking.  You are always dizzy.  You pass out (faint).  You get very bad headaches.  You feel weak in your hands, arms, or legs.  You have changes in your hearing.  You have changes in how you see (vision).  You get a stiff neck.  Bright light starts to bother you. Summary  Vertigo is the feeling that you or the things around you are moving when they are not.  Your doctor will do tests to find the cause of your vertigo.  You may be told to avoid some tasks, positions, or movements.  Contact a doctor if your medicine is not helping, or if you have a fever, new symptoms, or a change in behavior.  Get help right away if you get very bad headaches, or if you have changes in how you speak, hear, or see. This information is not intended to replace advice given to you by your health care provider. Make sure you discuss any questions you have with your health care provider. Document  Revised: 04/03/2018 Document Reviewed: 04/03/2018 Elsevier Patient Education  2020 [employer].

## 2019-07-10 LAB — CBC WITH DIFFERENTIAL/PLATELET
Basophils Absolute: 0.1 10*3/uL (ref 0.0–0.2)
Basos: 1 %
EOS (ABSOLUTE): 0.1 10*3/uL (ref 0.0–0.4)
Eos: 1 %
Hematocrit: 38.6 % (ref 34.0–46.6)
Hemoglobin: 13 g/dL (ref 11.1–15.9)
Immature Grans (Abs): 0 10*3/uL (ref 0.0–0.1)
Immature Granulocytes: 0 %
Lymphocytes Absolute: 1.8 10*3/uL (ref 0.7–3.1)
Lymphs: 31 %
MCH: 30.6 pg (ref 26.6–33.0)
MCHC: 33.7 g/dL (ref 31.5–35.7)
MCV: 91 fL (ref 79–97)
Monocytes Absolute: 0.7 10*3/uL (ref 0.1–0.9)
Monocytes: 12 %
Neutrophils Absolute: 3.2 10*3/uL (ref 1.4–7.0)
Neutrophils: 55 %
Platelets: 231 10*3/uL (ref 150–450)
RBC: 4.25 x10E6/uL (ref 3.77–5.28)
RDW: 12.6 % (ref 11.7–15.4)
WBC: 5.8 10*3/uL (ref 3.4–10.8)

## 2019-07-10 LAB — COMPREHENSIVE METABOLIC PANEL
ALT: 11 IU/L (ref 0–32)
AST: 22 IU/L (ref 0–40)
Albumin/Globulin Ratio: 2 (ref 1.2–2.2)
Albumin: 4.5 g/dL (ref 3.8–4.8)
Alkaline Phosphatase: 54 IU/L (ref 39–117)
BUN/Creatinine Ratio: 14 (ref 9–23)
BUN: 11 mg/dL (ref 6–24)
Bilirubin Total: 0.3 mg/dL (ref 0.0–1.2)
CO2: 23 mmol/L (ref 20–29)
Calcium: 9.5 mg/dL (ref 8.7–10.2)
Chloride: 106 mmol/L (ref 96–106)
Creatinine, Ser: 0.76 mg/dL (ref 0.57–1.00)
GFR calc Af Amer: 110 mL/min/{1.73_m2} (ref >59)
GFR calc non Af Amer: 95 mL/min/{1.73_m2} (ref >59)
Globulin, Total: 2.2 g/dL (ref 1.5–4.5)
Glucose: 82 mg/dL (ref 65–99)
Potassium: 4.3 mmol/L (ref 3.5–5.2)
Sodium: 142 mmol/L (ref 134–144)
Total Protein: 6.7 g/dL (ref 6.0–8.5)

## 2019-07-10 LAB — HEMOGLOBIN A1C
Est. average glucose Bld gHb Est-mCnc: 108 mg/dL
Hgb A1c MFr Bld: 5.4 % (ref 4.8–5.6)

## 2019-07-10 LAB — LIPID PANEL
Chol/HDL Ratio: 3.3 ratio (ref 0.0–4.4)
Cholesterol, Total: 170 mg/dL (ref 100–199)
HDL: 52 mg/dL (ref >39)
LDL Chol Calc (NIH): 87 mg/dL (ref 0–99)
Triglycerides: 185 mg/dL — ABNORMAL HIGH (ref 0–149)
VLDL Cholesterol Cal: 31 mg/dL (ref 5–40)

## 2019-07-10 LAB — THYROID PANEL WITH TSH
Free Thyroxine Index: 3 (ref 1.2–4.9)
T3 Uptake Ratio: 29 % (ref 24–39)
T4, Total: 10.5 ug/dL (ref 4.5–12.0)
TSH: 0.538 u[IU]/mL (ref 0.450–4.500)

## 2019-07-10 LAB — SEDIMENTATION RATE: Sed Rate: 4 mm/hr (ref 0–32)

## 2019-07-10 NOTE — Progress Notes (Signed)
Patient ID: Crystal Mcknight, female   DOB: 02-15-1976, 44 y.o.   MRN: [id number]   This visit occurred during the SARS-CoV-2 public health emergency.  Safety protocols were in place, including screening questions prior to the visit, additional usage of staff PPE, and extensive cleaning of exam room while observing appropriate contact time as indicated for disinfecting solutions.   HPI  Crystal Mcknight is a 44 y.o.-year-old female, referred by her PCP, Chrzanowski, Jami B, NP for management of hypothyroidism. She moved to [place] from [place] in 2009.  She was followed by endocrinology there.  Pt. has been dx with hypothyroidism in 1990s (at 6-19 y/o). She also has a h/o postpartum thyroiditis after both pregnancies - last 11 years ago after the birth of her son.  She had a thyroid ultrasound before moving from [place] and it was mentioned that her thyroid was not visible anymore.  She is currently on Synthroid DAW 175 + 25 mcg, dose increased 06/21/2019.  At that time, she saw her OB/GYN Dr. And she had several symptoms including fatigue, constipation, hair loss. A TSH was checked and it was high.  Patient describes that she woke up with vertigo 2 days ago  >> saw PCP >> TFTs repeated: normal. She had vertigo 12 years ago >> investigated by MRI >> no pathology but she had appendicitis few days later so the vertigo was attributed to the acute episode.  She takes the thyroid hormone: - fasting, at 5-6 am - with water - separated by >30 min from b'fast  - no calcium, iron, PPIs - + multivitamins 4-5 h after LT4  I reviewed pt's thyroid tests: Lab Results  Component Value Date   TSH 0.538 07/09/2019   TSH 3.890 06/05/2018   TSH 0.10 (L) 03/18/2016   Prev: 06/21/2019: TSH 7.120 (0.45-4.5), total T4 7.2 (4.5-12) - dose increased from 175 to 200 mcg daily 07/29/2018: TSH 3.29  Antithyroid antibodies: No results found for: THGAB No components found for: TPOAB   Pt describes: - no  significant weight gain -+ Fatigue -+ Cold intolerance -+ Anxiety and depression -+ Constipation - no dry skin -+ Hair loss -forearms Normal menses but with increased flow. Has a h/o endometriosis >> increased cramps. She sees [place].  Pt denies feeling nodules in neck, hoarseness, dysphagia/odynophagia, SOB with lying down.  She has + FH of thyroid disorders in: father and P aunts. No FH of thyroid cancer. Father and aunts with SLE. No h/o radiation tx to head or neck. No recent use of iodine supplements.  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  ROS: Constitutional: + See HPI, + nocturia Eyes: no blurry vision, no xerophthalmia ENT: no sore throat, no nodules palpated in throat, no dysphagia/odynophagia, no hoarseness Cardiovascular: no CP/SOB/palpitations/leg swelling Respiratory: no cough/SOB Gastrointestinal: no N/V/D/+ C (improving) Musculoskeletal: no muscle/+ joint aches Skin: no rashes, + itching, + hair loss Neurological: no tremors/numbness/tingling/dizziness Psychiatric: + Both: Depression/anxiety + Decreased libido  Past Medical History:  Diagnosis Date  . Arthritis    hands  . Depression   . Hypothyroidism   . Pelvic pain   . PONV (postoperative nausea and vomiting)   . SVD (spontaneous vaginal delivery)    x 2  . Thyroid disease    Past Surgical History:  Procedure Laterality Date  . APPENDECTOMY    . LAPAROSCOPY N/A 05/31/2013   Procedure: LAPAROSCOPY OPERATIVE, FULGURATION OF ENDOMETRIOSIS, REMOVAL OF BILATERAL FALLOPIAN TUBE CYST;  Surgeon: Elzie Rings  Julien Girt, MD;  Location: [facility];  Service: Gynecology;  Laterality: N/A;  . RHINOPLASTY    . WISDOM TOOTH EXTRACTION     Social History   Socioeconomic History  . Marital status: Married    Spouse name: Not on file  . Number of children: 2  . Years of education: Not on file  . Highest education level: Not on file  Occupational History  . Not on file   Tobacco Use  . Smoking status: Never Smoker  . Smokeless tobacco: Never Used  Substance and Sexual Activity  . Alcohol use: No, stopped  . Drug use: No  . Sexual activity: Yes    Birth control/protection: None    Comment: husband vasectomy  Other Topics Concern  . Not on file  Social History Narrative  . Not on file   Social Determinants of Health   Financial Resource Strain:   . Difficulty of Paying Living Expenses: Not on file  Food Insecurity:   . Worried About Charity fundraiser in the Last Year: Not on file  . Ran Out of Food in the Last Year: Not on file  Transportation Needs:   . Lack of Transportation (Medical): Not on file  . Lack of Transportation (Non-Medical): Not on file  Physical Activity:   . Days of Exercise per Week: Not on file  . Minutes of Exercise per Session: Not on file  Stress:   . Feeling of Stress : Not on file  Social Connections:   . Frequency of Communication with Friends and Family: Not on file  . Frequency of Social Gatherings with Friends and Family: Not on file  . Attends Religious Services: Not on file  . Active Member of Clubs or Organizations: Not on file  . Attends Archivist Meetings: Not on file  . Marital Status: Not on file  Intimate Partner Violence:   . Fear of Current or Ex-Partner: Not on file  . Emotionally Abused: Not on file  . Physically Abused: Not on file  . Sexually Abused: Not on file   Current Outpatient Medications on File Prior to Visit  Medication Sig Dispense Refill  . fluticasone (FLONASE) 50 MCG/ACT nasal spray Place into both nostrils daily.    . meclizine (ANTIVERT) 25 MG tablet Take 1 tablet (25 mg total) by mouth 3 (three) times daily as needed for dizziness. 30 tablet 0  . Multiple Vitamins-Minerals (MULTIVITAMIN ADULT EXTRA C PO) multivitamin    . SYNTHROID 175 +25 MCG tablet     . traZODone (DESYREL) 50 MG tablet Take at bedtime as directed 90 tablet 1  . sertraline (ZOLOFT) 50 MG tablet  Take 1 tablet (50 mg total) by mouth daily. 90 tablet 1   No current facility-administered medications on file prior to visit.   Allergies  Allergen Reactions  . Penicillins Rash    When she was 44years old, had a rash when took this for strep throat   Family History  Problem Relation Age of Onset  . Cancer Maternal Grandmother   . Heart disease Maternal Grandfather   . Cancer Paternal Grandmother   . Heart disease Paternal Grandfather   . Cancer Mother   . Hyperlipidemia Mother   . Hyperlipidemia Father   . Hypertension Father   Also, breast cancer in mother. + see HPI  PE: BP 120/62   Pulse 85   Ht 5' 6.5" (1.689 m)   Wt 135 lb (61.2 kg)   LMP 07/02/2019   SpO2  98%   BMI 21.46 kg/m  Wt Readings from Last 3 Encounters:  07/11/19 135 lb (61.2 kg)  07/09/19 133 lb (60.3 kg)  06/27/18 125 lb 12.8 oz (57.1 kg)   Constitutional: normal weight, in NAD Eyes: PERRLA, EOMI, no exophthalmos ENT: moist mucous membranes, no thyromegaly, no cervical lymphadenopathy Cardiovascular: RRR, No MRG Respiratory: CTA B Gastrointestinal: abdomen soft, NT, ND, BS+ Musculoskeletal: no deformities, strength intact in all 4 Skin: moist, warm, no rashes Neurological: no tremor with outstretched hands, DTR normal in all 4  ASSESSMENT: 1. Hypothyroidism  2. Bloating  PLAN:  1. Patient with long-standing hypothyroidism, on Synthroid d.a.w. therapy.  Her TFTs fluctuated mostly in the normal range, however, on 06/21/2019, TSH was elevated and she had hypothyroid symptoms.  At that time, her dose of levothyroxine was increased from 175 mcg to 200 mcg daily.  Since these are higher doses, she was referred to endocrinology.  Of note, she had another TSH obtained 2 days ago and this normalized on the new Synthroid dose. - she appears euthyroid and mentions that she feels better on the higher Synthroid dose.  However, she does have vertigo which appears to be slightly better but she has to be careful  how she moves her head. - she does not appear to have a goiter, thyroid nodules, or neck compression symptoms.  In fact, she apparently had a thyroid ultrasound before moving here from [place] and this showed that the thyroid gland disintegrated.  I explained that this usually is a feature of thyroiditis, possibly Hashimoto's thyroiditis. - We discussed about correct intake of levothyroxine, fasting, with water, separated by at least 30 minutes from breakfast, and separated by more than 4 hours from calcium, iron, multivitamins, acid reflux medications (PPIs).  She is taking them correctly. - will check thyroid tests in 1.5 months to ensure stability: TSH, free T4.   -For now, we decided to check her TPO and ATA antibodies to check for Hashimoto's thyroiditis.  We discussed about this is being an autoimmune disease. It is most common cause of Hashimoto's thyroiditis in [place]. - I will see her back in 4 months  2. Bloating -Regarding her increased levothyroxine requirements, I explained that this may happen with malabsorption syndromes.  She does not have a history of gastric bypass surgery or other GI malabsorptive conditions.  She does complain of more gas recently, so, at this visit, we discussed about checking her for celiac disease and also checking a vitamin D level which can be caused by celiac disease.  Of note, she did not have anemia on a recent CBC.  Her LFTs are not elevated. -I explained that if the tests were TFTs are negative, she may still need investigation with EGD since tissue diagnosis is most acurate  Orders Placed This Encounter  Procedures  . Thyroid peroxidase antibody  . Thyroglobulin antibody  . VITAMIN D 25 Hydroxy (Vit-D Deficiency, Fractures)  . Celiac Disease Panel   Component     Latest Ref Rng & Units 07/11/2019  Immunoglobulin A     47 - 310 mg/dL 159  (tTG) Ab, IgG     U/mL 3  (tTG) Ab, IgA     U/mL 1  Gliadin IgA     Units 9  Deamidated Gliadin Abs, IgG      Units 1  Thyroperoxidase Ab SerPl-aCnc     <9 IU/mL 1  Thyroglobulin Ab     < or = 1 IU/mL <1  VITD  30.00 - 100.00 ng/mL 48.03   All labs are normal.  No signs of Hashimoto's thyroiditis or celiac disease.  Vitamin D level is normal. We will await her TFTs in 1.5 months.  Philemon Kingdom, MD PhD [facility]

## 2019-07-11 ENCOUNTER — Encounter: Payer: Self-pay | Admitting: Internal Medicine

## 2019-07-11 ENCOUNTER — Other Ambulatory Visit: Payer: Self-pay

## 2019-07-11 ENCOUNTER — Ambulatory Visit: Payer: BC Managed Care – PPO | Admitting: Internal Medicine

## 2019-07-11 VITALS — BP 120/62 | HR 85 | Ht 66.5 in | Wt 135.0 lb

## 2019-07-11 DIAGNOSIS — E039 Hypothyroidism, unspecified: Secondary | ICD-10-CM | POA: Diagnosis not present

## 2019-07-11 DIAGNOSIS — R140 Abdominal distension (gaseous): Secondary | ICD-10-CM

## 2019-07-11 LAB — VITAMIN D 25 HYDROXY (VIT D DEFICIENCY, FRACTURES): VITD: 48.03 ng/mL (ref 30.00–100.00)

## 2019-07-11 NOTE — Patient Instructions (Signed)
Please stop at the lab.  Continue Synthroid 175 + 25 mcg for now.  Take the thyroid hormone every day, with water, at least 30 minutes before breakfast, separated by at least 4 hours from: - acid reflux medications - calcium - iron - multivitamins  Please come back for thyroid labs in 5-6 weeks.  Please come back for a follow-up appointment in 4 months.

## 2019-07-13 LAB — CELIAC DISEASE PANEL
(tTG) Ab, IgA: 1 U/mL
(tTG) Ab, IgG: 3 U/mL
Gliadin IgA: 9 Units
Gliadin IgG: 1 Units
Immunoglobulin A: 159 mg/dL (ref 47–310)

## 2019-07-13 LAB — THYROID PEROXIDASE ANTIBODY: Thyroperoxidase Ab SerPl-aCnc: 1 IU/mL (ref <9)

## 2019-07-13 LAB — THYROGLOBULIN ANTIBODY: Thyroglobulin Ab: <1 IU/mL (ref <=1)

## 2019-07-26 DIAGNOSIS — Z6820 Body mass index (BMI) 20.0-20.9, adult: Secondary | ICD-10-CM | POA: Diagnosis not present

## 2019-07-26 DIAGNOSIS — Z01419 Encounter for gynecological examination (general) (routine) without abnormal findings: Secondary | ICD-10-CM | POA: Diagnosis not present

## 2019-07-26 DIAGNOSIS — R87619 Unspecified abnormal cytological findings in specimens from cervix uteri: Secondary | ICD-10-CM | POA: Insufficient documentation

## 2019-07-26 DIAGNOSIS — N809 Endometriosis, unspecified: Secondary | ICD-10-CM | POA: Insufficient documentation

## 2019-07-26 DIAGNOSIS — Z1231 Encounter for screening mammogram for malignant neoplasm of breast: Secondary | ICD-10-CM | POA: Diagnosis not present

## 2019-07-29 ENCOUNTER — Ambulatory Visit: Payer: BC Managed Care – PPO | Attending: Internal Medicine

## 2019-07-29 DIAGNOSIS — Z23 Encounter for immunization: Secondary | ICD-10-CM | POA: Insufficient documentation

## 2019-07-29 NOTE — Progress Notes (Signed)
   Covid-19 Vaccination Clinic  Name:  Crystal Mcknight    MRN: [id number] DOB: 1975/12/20  07/29/2019  Crystal Mcknight was observed post Covid-19 immunization for 15 minutes without incident. She was provided with Vaccine Information Sheet and instruction to access the V-Safe system.   Crystal Mcknight was instructed to call 911 with any severe reactions post vaccine: Marland Kitchen Difficulty breathing  . Swelling of face and throat  . A fast heartbeat  . A bad rash all over body  . Dizziness and weakness   Immunizations Administered    Name Date Dose VIS Date Route   Pfizer COVID-19 Vaccine 07/29/2019  2:06 AM 0.3 mL 05/04/2019 Intramuscular   Manufacturer: [place]   Lot: [id number]   [place]: [id number]

## 2019-08-06 ENCOUNTER — Telehealth: Payer: Self-pay | Admitting: Emergency Medicine

## 2019-08-06 ENCOUNTER — Other Ambulatory Visit (HOSPITAL_COMMUNITY): Payer: Self-pay | Admitting: Medical

## 2019-08-16 ENCOUNTER — Other Ambulatory Visit (INDEPENDENT_AMBULATORY_CARE_PROVIDER_SITE_OTHER): Payer: BC Managed Care – PPO

## 2019-08-16 ENCOUNTER — Other Ambulatory Visit: Payer: Self-pay

## 2019-08-16 ENCOUNTER — Other Ambulatory Visit: Payer: Self-pay | Admitting: Internal Medicine

## 2019-08-16 DIAGNOSIS — E039 Hypothyroidism, unspecified: Secondary | ICD-10-CM

## 2019-08-16 LAB — TSH: TSH: 0.33 u[IU]/mL — ABNORMAL LOW (ref 0.35–4.50)

## 2019-08-16 LAB — T4, FREE: Free T4: 1.14 ng/dL (ref 0.60–1.60)

## 2019-08-16 MED ORDER — SYNTHROID 150 MCG PO TABS: 150.0000 ug | ORAL_TABLET | Freq: Every day | ORAL | 3 refills | Status: DC

## 2019-08-27 ENCOUNTER — Encounter: Payer: Self-pay | Admitting: Internal Medicine

## 2019-08-27 ENCOUNTER — Other Ambulatory Visit: Payer: Self-pay | Admitting: Internal Medicine

## 2019-08-27 MED ORDER — SYNTHROID 175 MCG PO TABS: 175.0000 ug | ORAL_TABLET | Freq: Every day | ORAL | 3 refills | Status: DC

## 2019-08-28 ENCOUNTER — Telehealth: Payer: Self-pay | Admitting: Internal Medicine

## 2019-08-28 MED ORDER — LEVOTHYROXINE SODIUM 175 MCG PO TABS: 175.0000 ug | ORAL_TABLET | Freq: Every day | ORAL | 3 refills | Status: DC

## 2019-08-28 NOTE — Addendum Note (Signed)
Addended by: Cardell Peach I on: 08/28/2019 04:37 PM   Modules accepted: Orders

## 2019-08-28 NOTE — Telephone Encounter (Signed)
OK to send 45 tabs of generic LT4 with 3 refills

## 2019-08-28 NOTE — Telephone Encounter (Signed)
Generic RX sent.

## 2019-08-28 NOTE — Telephone Encounter (Signed)
Kim with [facility] called to verify Synthroid - states RX is to dispense as written and patient is stating that they would like to try the generic.  Please advise Kim at [phone] ext [phone]

## 2019-09-03 ENCOUNTER — Other Ambulatory Visit: Payer: Self-pay

## 2019-09-03 ENCOUNTER — Ambulatory Visit (INDEPENDENT_AMBULATORY_CARE_PROVIDER_SITE_OTHER): Payer: BC Managed Care – PPO | Admitting: Family Medicine

## 2019-09-03 DIAGNOSIS — Z23 Encounter for immunization: Secondary | ICD-10-CM

## 2019-09-03 DIAGNOSIS — Z111 Encounter for screening for respiratory tuberculosis: Secondary | ICD-10-CM | POA: Diagnosis not present

## 2019-09-03 NOTE — Progress Notes (Signed)
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

## 2019-09-03 NOTE — Patient Instructions (Signed)
  Tuberculosis Risk Questionnaire  1. No Were you born outside the [place] in one of the following parts of the world: [place], [place], [place], [place] or [place]?    2. No Have you traveled outside the [place] and lived for more than one month in one of the following parts of the world: [place], [place], [place], [place] or [place]?    3. No Do you have a compromised immune system such as from any of the following conditions:HIV/AIDS, organ or bone marrow transplantation, diabetes, immunosuppressive medicines (e.g. Prednisone, Remicaide), leukemia, lymphoma, cancer of the head or neck, gastrectomy or jejunal bypass, end-stage renal disease (on dialysis), or silicosis?     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    5. No Have you ever: injected illegal drugs, used crack cocaine, lived in a homeless shelter  or been in jail or prison?     6. No Have you ever been exposed to anyone with infectious tuberculosis?  7. No Have you ever had a BCG vaccine? (BCG is a vaccine for tuberculosis  (TB) used in OTHER countries, NOT in the [place]).  8. No Have you ever been advised by a health care provider NOT to have a TB skin test?  9. No Have you ever had a POSITIVE TB skin test?  IF SO, when? n/a  IF SO, were you treated with INH? n/a  IF SO, where? n/a  Tuberculosis Symptom Questionnaire  Do you currently have any of the following symptoms?  1. No Unexplained cough lasting more than 3 weeks?   2. No Unexplained fever lasting more than 3 weeks.   3. No Night Sweats (sweating that leaves the bedclothes and sheets wet)     4. No Shortness of Breath   5. No Chest Pain   6. No Unintentional weight loss    7. No Unexplained fatigue (very tired for no reason)

## 2019-09-05 ENCOUNTER — Ambulatory Visit (INDEPENDENT_AMBULATORY_CARE_PROVIDER_SITE_OTHER): Payer: BC Managed Care – PPO | Admitting: Registered Nurse

## 2019-09-05 ENCOUNTER — Other Ambulatory Visit: Payer: Self-pay

## 2019-09-05 DIAGNOSIS — Z111 Encounter for screening for respiratory tuberculosis: Secondary | ICD-10-CM

## 2019-09-05 LAB — TB SKIN TEST
Induration: 0 mm
TB Skin Test: NEGATIVE

## 2019-09-05 NOTE — Progress Notes (Signed)
Patient here for ppd reading only of R forearm, results negative.

## 2019-09-17 ENCOUNTER — Encounter: Payer: Self-pay | Admitting: Internal Medicine

## 2019-09-17 DIAGNOSIS — E039 Hypothyroidism, unspecified: Secondary | ICD-10-CM

## 2019-09-19 ENCOUNTER — Other Ambulatory Visit (INDEPENDENT_AMBULATORY_CARE_PROVIDER_SITE_OTHER): Payer: BC Managed Care – PPO

## 2019-09-19 ENCOUNTER — Other Ambulatory Visit: Payer: Self-pay

## 2019-09-19 DIAGNOSIS — E039 Hypothyroidism, unspecified: Secondary | ICD-10-CM | POA: Diagnosis not present

## 2019-09-19 LAB — TSH: TSH: 0.02 u[IU]/mL — ABNORMAL LOW (ref 0.35–4.50)

## 2019-09-19 LAB — T4, FREE: Free T4: 1.48 ng/dL (ref 0.60–1.60)

## 2019-09-20 ENCOUNTER — Other Ambulatory Visit: Payer: Self-pay | Admitting: Internal Medicine

## 2019-09-20 MED ORDER — LEVOTHYROXINE SODIUM 150 MCG PO TABS: 150.0000 ug | ORAL_TABLET | Freq: Every day | ORAL | 3 refills | Status: DC

## 2019-10-01 ENCOUNTER — Telehealth: Payer: Self-pay | Admitting: Internal Medicine

## 2019-10-01 MED ORDER — LEVOTHYROXINE SODIUM 150 MCG PO TABS: 150.0000 ug | ORAL_TABLET | Freq: Every day | ORAL | 3 refills | Status: DC

## 2019-10-01 NOTE — Telephone Encounter (Signed)
RX sent to new pharmacy

## 2019-10-01 NOTE — Telephone Encounter (Signed)
Patient called requesting her RX for Levothyroxine sent to  [facility] [address], [place] Phone:  [phone]  Fax:  [phone]     She said [facility] no longer takes her insurance.

## 2019-11-15 ENCOUNTER — Ambulatory Visit: Payer: BC Managed Care – PPO | Admitting: Internal Medicine

## 2019-11-29 ENCOUNTER — Other Ambulatory Visit (INDEPENDENT_AMBULATORY_CARE_PROVIDER_SITE_OTHER): Payer: BC Managed Care – PPO

## 2019-11-29 ENCOUNTER — Other Ambulatory Visit: Payer: Self-pay

## 2019-11-29 DIAGNOSIS — E039 Hypothyroidism, unspecified: Secondary | ICD-10-CM | POA: Diagnosis not present

## 2019-11-29 LAB — T4, FREE: Free T4: 0.87 ng/dL (ref 0.60–1.60)

## 2019-11-29 LAB — TSH: TSH: 3.93 u[IU]/mL (ref 0.35–4.50)

## 2019-11-30 ENCOUNTER — Other Ambulatory Visit: Payer: BC Managed Care – PPO

## 2019-12-03 ENCOUNTER — Other Ambulatory Visit: Payer: Self-pay

## 2019-12-03 ENCOUNTER — Encounter: Payer: Self-pay | Admitting: Internal Medicine

## 2019-12-03 ENCOUNTER — Ambulatory Visit: Payer: BC Managed Care – PPO | Admitting: Internal Medicine

## 2019-12-03 VITALS — BP 118/78 | HR 78 | Ht 66.5 in | Wt 134.0 lb

## 2019-12-03 DIAGNOSIS — E039 Hypothyroidism, unspecified: Secondary | ICD-10-CM

## 2019-12-03 MED ORDER — LEVOTHYROXINE SODIUM 175 MCG PO TABS: 175.0000 ug | ORAL_TABLET | Freq: Every day | ORAL | 3 refills | Status: DC

## 2019-12-03 NOTE — Patient Instructions (Signed)
Please continue Levothyroxine 175 mcg.  Take the thyroid hormone every day, with water, at least 30 minutes before breakfast, separated by at least 4 hours from: - acid reflux medications - calcium - iron - multivitamins  Please come back for a follow-up appointment in 6 months.

## 2019-12-03 NOTE — Progress Notes (Signed)
Patient ID: [name], female   DOB: 01-Oct-1975, 44 y.o.   MRN: [id number]   This visit occurred during the SARS-CoV-2 public health emergency.  Safety protocols were in place, including screening questions prior to the visit, additional usage of staff PPE, and extensive cleaning of exam room while observing appropriate contact time as indicated for disinfecting solutions.   HPI  Crystal Mcknight is a 44 y.o.-year-old female, initially referred by her PCP, Chrzanowski, Jami B, NP returning for follow-up for of hypothyroidism. She moved to [place] from [place] in 2009.  She was followed by endocrinology there.  Last visit with me in 06/2019.   Reviewed and addended history Pt. has been dx with hypothyroidism in 100s (at 47-21 y/o). She also has a h/o postpartum thyroiditis after both pregnancies - last 11 years ago after the birth of her son.  She had a thyroid ultrasound before moving from [place] and it was mentioned that her thyroid was not visible anymore.  She was previously on Synthroid DAW 175 + 25 mcg, dose increased 06/21/2019.  At that time, she saw her OB/GYN Dr. And she had several symptoms including fatigue, constipation, hair loss. A TSH was checked and it was high.  2 days prior to our appointment in 06/2019 she woke up with vertigo >> saw PCP >> TFTs repeated: normal. She had vertigo 12 years ago >> investigated by MRI >> no pathology but she had appendicitis few days later so the vertigo was attributed to the acute episode.  Since our last visit we decreased the dose of her levothyroxine to 150 mcg daily as her TSH was suppressed. She felt terrible on this dose (VERY tired) >> she increased to 175 mcg daily by herself and her fatigue improved.  Pt is on levothyroxine 175 mcg daily, taken: - in am (5-6 AM) - fasting - at least 30 min from Mcknight'fast - no Ca, Fe,  PPIs - + Multivitamins 4 to 5 hours after levothyroxine - not on Biotin  Reviewed patient's TFTs: Lab Results   Component Value Date   TSH 3.93 11/29/2019   TSH 0.02 (L) 09/19/2019   TSH 0.33 (L) 08/16/2019   TSH 0.538 07/09/2019   TSH 3.890 06/05/2018   TSH 0.10 (L) 03/18/2016   FREET4 0.87 11/29/2019   FREET4 1.48 09/19/2019   FREET4 1.14 08/16/2019   Prev: 06/21/2019: TSH 7.120 (0.45-4.5), total T4 7.2 (4.5-12) - dose increased from 175 to 200 mcg daily 07/29/2018: TSH 3.29  Antithyroid antibodies were not elevated: Component     Latest Ref Rng & Units 07/11/2019  Thyroperoxidase Ab SerPl-aCnc     <9 IU/mL 1  Thyroglobulin Ab     < or = 1 IU/mL <1   At last visit, due to the increased levothyroxine requirements and also due to the fact that she was complaining of bloating and she had family history of autoimmune diseases, we tested her for celiac disease.  Blood work was negative, however, we discussed that she may need to have further investigation by GI to completely rule out celiac disease: Component     Latest Ref Rng & Units 07/11/2019  Immunoglobulin A     47 - 310 mg/dL 159  (tTG) Ab, IgG     U/mL 3  (tTG) Ab, IgA     U/mL 1  Gliadin IgA     Units 9  Deamidated Gliadin Abs, IgG     Units 1   Also, vitamin D level was normal: Lab Results  Component Value Date   VD25OH 48.03 07/11/2019   At last visit she described fatigue, cold intolerance, constipation, hair loss on forearms, anxiety/depression.  She had normal menses but with increased flow and also history of endometriosis with increased cramping.  She continues to see OB/GYN.  At this visit, she felt very fatigued until she increased the dose to 175 mcg >> now feels at baseline.  Pt denies: - feeling nodules in neck - hoarseness - dysphagia - choking - SOB with lying down  She has + FH of thyroid disorders in: father and P aunts. Father and aunts with SLE. No FH of thyroid cancer. No h/o radiation tx to head or neck. No herbal supplements. No Biotin use. No recent steroids use.   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  ROS: Constitutional: no weight gain/no weight loss, resolved fatigue, no subjective hyperthermia, no subjective hypothermia, + nocturia Eyes: no blurry vision, no xerophthalmia ENT: no sore throat, + see HPI Cardiovascular: no CP/no SOB/no palpitations/no leg swelling Respiratory: no cough/no SOB/no wheezing Gastrointestinal: no N/no V/no D/no C/no acid reflux Musculoskeletal: no muscle aches/ joint aches Skin: no rashes, improved hair loss Neurological: no tremors/no numbness/no tingling/no dizziness  I reviewed pt's medications, allergies, PMH, social hx, family hx, and changes were documented in the history of present illness. Otherwise, unchanged from my initial visit note.  Past Medical History:  Diagnosis Date  . Arthritis    hands  . Depression   . Hypothyroidism   . Pelvic pain   . PONV (postoperative nausea and vomiting)   . SVD (spontaneous vaginal delivery)    x 2  . Thyroid disease    Past Surgical History:  Procedure Laterality Date  . APPENDECTOMY    . LAPAROSCOPY N/A 05/31/2013   Procedure: LAPAROSCOPY OPERATIVE, FULGURATION OF ENDOMETRIOSIS, REMOVAL OF BILATERAL FALLOPIAN TUBE CYST;  Surgeon: Crystal Pearson, MD;  Location: [facility];  Service: Gynecology;  Laterality: N/A;  . RHINOPLASTY    . WISDOM TOOTH EXTRACTION     Social History   Socioeconomic History  . Marital status: Married    Spouse name: Not on file  . Number of children: 2  . Years of education: Not on file  . Highest education level: Not on file  Occupational History  . Not on file  Tobacco Use  . Smoking status: Never Smoker  . Smokeless tobacco: Never Used  Substance and Sexual Activity  . Alcohol use: No, stopped  . Drug use: No  . Sexual activity: Yes    Birth control/protection: None    Comment: husband vasectomy  Other Topics Concern  . Not on file  Social History Narrative  . Not on file   Social Determinants of  Health   Financial Resource Strain:   . Difficulty of Paying Living Expenses: Not on file  Food Insecurity:   . Worried About Charity fundraiser in the Last Year: Not on file  . Ran Out of Food in the Last Year: Not on file  Transportation Needs:   . Lack of Transportation (Medical): Not on file  . Lack of Transportation (Non-Medical): Not on file  Physical Activity:   . Days of Exercise per Week: Not on file  . Minutes of Exercise per Session: Not on file  Stress:   . Feeling of Stress : Not on file  Social Connections:   . Frequency of Communication with Friends and Family: Not on file  .  Frequency of Social Gatherings with Friends and Family: Not on file  . Attends Religious Services: Not on file  . Active Member of Clubs or Organizations: Not on file  . Attends Archivist Meetings: Not on file  . Marital Status: Not on file  Intimate Partner Violence:   . Fear of Current or Ex-Partner: Not on file  . Emotionally Abused: Not on file  . Physically Abused: Not on file  . Sexually Abused: Not on file   Current Outpatient Medications  Medication Sig Dispense Refill  . fluticasone (FLONASE) 50 MCG/ACT nasal spray Place into both nostrils daily.    Marland Kitchen levothyroxine (SYNTHROID) 150 MCG tablet Take 1 tablet (150 mcg total) by mouth daily. 45 tablet 3  . meclizine (ANTIVERT) 25 MG tablet Take 1 tablet (25 mg total) by mouth 3 (three) times daily as needed for dizziness. 30 tablet 0  . Multiple Vitamins-Minerals (MULTIVITAMIN ADULT EXTRA C PO) multivitamin    . sertraline (ZOLOFT) 50 MG tablet Take 1 tablet (50 mg total) by mouth daily. 90 tablet 1  . traZODone (DESYREL) 50 MG tablet Take at bedtime as directed 90 tablet 1   No current facility-administered medications for this visit.    Allergies  Allergen Reactions  . Penicillins Rash    When she was 44years old, had a rash when took this for strep throat   Family History  Problem Relation Age of Onset  . Cancer  Maternal Grandmother   . Heart disease Maternal Grandfather   . Cancer Paternal Grandmother   . Heart disease Paternal Grandfather   . Cancer Mother   . Hyperlipidemia Mother   . Hyperlipidemia Father   . Hypertension Father   Also, breast cancer in mother. + see HPI  PE: BP 118/78   Pulse 78   Ht 5' 6.5" (1.689 m)   Wt 134 lb (60.8 kg)   SpO2 99%   BMI 21.30 kg/m  Wt Readings from Last 3 Encounters:  12/03/19 134 lb (60.8 kg)  07/11/19 135 lb (61.2 kg)  07/09/19 133 lb (60.3 kg)   Constitutional: Normal weight, in NAD Eyes: PERRLA, EOMI, no exophthalmos ENT: moist mucous membranes, no thyromegaly, no cervical lymphadenopathy Cardiovascular: RRR, No MRG Respiratory: CTA Mcknight Gastrointestinal: abdomen soft, NT, ND, BS+ Musculoskeletal: no deformities, strength intact in all 4 Skin: moist, warm, no rashes Neurological: no tremor with outstretched hands, DTR normal in all 4  ASSESSMENT: 1. Hypothyroidism  PLAN:  1. Patient with longstanding hypothyroidism, on Synthroid d.a.w. therapy.  Her TFTs fluctuated mostly in the normal range, however, on 06/21/2019, TSH was elevated and she had hypothyroid symptoms.  At that time, her dose of levothyroxine was increased from 175 mcg to 200 mcg daily.  Since these were higher doses, she was referred to endocrinology.  Since then, we decreased the dose to 150 mcg daily but she felt very tired on this and went back to 175 mcg daily approximately 6 weeks ago.    She continues on this. - she appears euthyroid.  She had vertigo previously, which has improved.  Fatigue resolved after increasing the dose. - she denies neck compression symptoms.  In fact, she apparently had a thyroid ultrasound before moving here from [place] and this showed that the thyroid gland disintegrated.  I explained that this was usually a feature of thyroiditis, possibly Hashimoto's thyroiditis.  However, at last visit, her TPO and ATA antibodies were checked and these were  not elevated.  This does not rule  out a previous episode of inflammation. - latest thyroid labs reviewed with pt >> normal 4 days ago - normal. Lab Results  Component Value Date   TSH 3.93 11/29/2019   -We will continue LT4 175 mcg daily - pt feels good on this dose - we discussed about taking the thyroid hormone every day, with water, >30 minutes before breakfast, separated by >4 hours from acid reflux medications, calcium, iron, multivitamins. Pt. is taking it correctly. -I will see her back in 6 months   Philemon Kingdom, MD PhD [facility] Endocrinology

## 2020-01-08 ENCOUNTER — Other Ambulatory Visit: Payer: Self-pay | Admitting: Internal Medicine

## 2020-01-08 ENCOUNTER — Other Ambulatory Visit: Payer: Self-pay

## 2020-01-08 ENCOUNTER — Other Ambulatory Visit (INDEPENDENT_AMBULATORY_CARE_PROVIDER_SITE_OTHER): Payer: BC Managed Care – PPO

## 2020-01-08 DIAGNOSIS — E039 Hypothyroidism, unspecified: Secondary | ICD-10-CM

## 2020-01-08 LAB — T4, FREE: Free T4: 0.9 ng/dL (ref 0.60–1.60)

## 2020-01-08 LAB — TSH: TSH: 0.59 u[IU]/mL (ref 0.35–4.50)

## 2020-02-06 DIAGNOSIS — Z03818 Encounter for observation for suspected exposure to other biological agents ruled out: Secondary | ICD-10-CM | POA: Diagnosis not present

## 2020-03-03 ENCOUNTER — Ambulatory Visit: Payer: BC Managed Care – PPO | Attending: Internal Medicine

## 2020-03-03 DIAGNOSIS — Z23 Encounter for immunization: Secondary | ICD-10-CM

## 2020-03-03 NOTE — Progress Notes (Signed)
   Covid-19 Vaccination Clinic  Name:  Trinitee Horgan    MRN: [id number] DOB: [birth year]  03/03/2020  Ms. Krupka was observed post Covid-19 immunization for 15 minutes without incident. She was provided with Vaccine Information Sheet and instruction to access the V-Safe system.   Ms. Higginbotham was instructed to call 911 with any severe reactions post vaccine: Marland Kitchen Difficulty breathing  . Swelling of face and throat  . A fast heartbeat  . A bad rash all over body  . Dizziness and weakness

## 2020-05-23 ENCOUNTER — Encounter: Payer: Self-pay | Admitting: Family Medicine

## 2020-06-04 ENCOUNTER — Other Ambulatory Visit: Payer: Self-pay

## 2020-06-04 ENCOUNTER — Ambulatory Visit: Payer: BC Managed Care – PPO | Admitting: Internal Medicine

## 2020-06-04 ENCOUNTER — Encounter: Payer: Self-pay | Admitting: Internal Medicine

## 2020-06-04 VITALS — BP 110/72 | HR 91 | Ht 66.0 in | Wt 139.4 lb

## 2020-06-04 DIAGNOSIS — E039 Hypothyroidism, unspecified: Secondary | ICD-10-CM | POA: Diagnosis not present

## 2020-06-04 LAB — T4, FREE: Free T4: 0.98 ng/dL (ref 0.60–1.60)

## 2020-06-04 LAB — TSH: TSH: 1.35 u[IU]/mL (ref 0.35–4.50)

## 2020-06-04 NOTE — Progress Notes (Signed)
Patient ID: Crystal Mcknight, female   DOB: 03-Jul-1975, 45 y.o.   MRN: [id number]   This visit occurred during the SARS-CoV-2 public health emergency.  Safety protocols were in place, including screening questions prior to the visit, additional usage of staff PPE, and extensive cleaning of exam room while observing appropriate contact time as indicated for disinfecting solutions.   HPI  Crystal Mcknight is a 45 y.o.-year-old female, initially referred by her PCP, Chrzanowski, Jami B, NP returning for follow-up for of hypothyroidism. She moved to [place] from [place] in 2009.  She was followed by endocrinology there.  Last visit with me 6 months ago.  Reviewed and addended history: Pt. has been dx with hypothyroidism in 1990s (at 62-21 y/o). She also has a h/o postpartum thyroiditis after both pregnancies - last 11 years ago after the birth of her son.  She had a thyroid ultrasound before moving from [place] and it was mentioned that her thyroid was not visible anymore.  She was previously on Synthroid DAW 175 + 25 mcg, dose increased 06/21/2019.  At that time, she saw her OB/GYN Dr. And she had several symptoms including fatigue, constipation, hair loss. A TSH was checked and it was high.  2 days prior to our appointment in 06/2019 she woke up with vertigo >> saw PCP >> TFTs repeated: normal. She had vertigo 12 years ago >> investigated by MRI >> no pathology but she had appendicitis few days later so the vertigo was attributed to the acute episode.  Since our last visit we decreased the dose of her levothyroxine to 150 mcg daily as her TSH was suppressed. She felt terrible on this dose (VERY tired) >> she increased to 175 mcg daily 6 weeks before last visit her fatigue improved.  Since TFTs returns normal, we continued the same dose.  She takes the LT4 175 mcg daily: - in am (around 5-6 AM) - fasting - at least 30 min from Mcknight'fast - no calcium - no iron - stopped multivitamins since last visit,  only using a supplement with vitamin D - no PPIs - not on Biotin  Reviewed her TFTs: Lab Results  Component Value Date   TSH 0.59 01/08/2020   TSH 3.93 11/29/2019   TSH 0.02 (L) 09/19/2019   TSH 0.33 (L) 08/16/2019   TSH 0.538 07/09/2019   TSH 3.890 06/05/2018   TSH 0.10 (L) 03/18/2016   FREET4 0.90 01/08/2020   FREET4 0.87 11/29/2019   FREET4 1.48 09/19/2019   FREET4 1.14 08/16/2019   Prev: 06/21/2019: TSH 7.120 (0.45-4.5), total T4 7.2 (4.5-12) - dose increased from 175 to 200 mcg daily 07/29/2018: TSH 3.29  Her antithyroid antibodies were not elevated: Component     Latest Ref Rng & Units 07/11/2019  Thyroperoxidase Ab SerPl-aCnc     <9 IU/mL 1  Thyroglobulin Ab     < or = 1 IU/mL <1   Last year, due to the increased levothyroxine requirements and also due to the fact that she was complaining of bloating and she had family history of autoimmune diseases, we tested her for celiac disease.  The investigation was negative: Component     Latest Ref Rng & Units 07/11/2019  Immunoglobulin A     47 - 310 mg/dL 166  (tTG) Ab, IgG     U/mL 3  (tTG) Ab, IgA     U/mL 1  Gliadin IgA     Units 9  Deamidated Gliadin Abs, IgG     Units 1  Vitamin D level was normal: Lab Results  Component Value Date   VD25OH 48.03 07/11/2019   She takes a supplement containing vitamin D daily for the last 4 years.  In the past, she described fatigue, cold intolerance, constipation, hair loss on forearms, anxiety/depression.  She had normal menses but with increased flow and also history of endometriosis with increased cramping.  She continues to see OB/GYN.  Before last visit, she felt very fatigued and she increased the dose of levothyroxine to 175 mcg daily, after which she felt at baseline.  Pt denies: - feeling nodules in neck - hoarseness - dysphagia - choking - SOB with lying down  She has + FH of thyroid disorders in: father and P aunts. Father and aunts with SLE.No FH of thyroid  cancer. No h/o radiation tx to head or neck.  No herbal supplements. No Biotin use. No recent steroids use.   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  ROS: Constitutional: no weight gain/no weight loss, no fatigue, no subjective hyperthermia, no subjective hypothermia Eyes: no blurry vision, no xerophthalmia ENT: no sore throat, + see HPI Cardiovascular: no CP/no SOB/no palpitations/no leg swelling Respiratory: no cough/no SOB/no wheezing Gastrointestinal: no N/no V/no D/no C/no acid reflux Musculoskeletal: no muscle aches/no joint aches Skin: no rashes, no hair loss Neurological: no tremors/no numbness/no tingling/no dizziness  I reviewed pt's medications, allergies, PMH, social hx, family hx, and changes were documented in the history of present illness. Otherwise, unchanged from my initial visit note.  Past Medical History:  Diagnosis Date  . Arthritis    hands  . Depression   . Hypothyroidism   . Pelvic pain   . PONV (postoperative nausea and vomiting)   . SVD (spontaneous vaginal delivery)    x 2  . Thyroid disease    Past Surgical History:  Procedure Laterality Date  . APPENDECTOMY    . LAPAROSCOPY N/A 05/31/2013   Procedure: LAPAROSCOPY OPERATIVE, FULGURATION OF ENDOMETRIOSIS, REMOVAL OF BILATERAL FALLOPIAN TUBE CYST;  Surgeon: Marylynn Pearson, MD;  Location: [facility];  Service: Gynecology;  Laterality: N/A;  . RHINOPLASTY    . WISDOM TOOTH EXTRACTION     Social History   Socioeconomic History  . Marital status: Married    Spouse name: Not on file  . Number of children: 2  . Years of education: Not on file  . Highest education level: Not on file  Occupational History  . Not on file  Tobacco Use  . Smoking status: Never Smoker  . Smokeless tobacco: Never Used  Substance and Sexual Activity  . Alcohol use: No, stopped  . Drug use: No  . Sexual activity: Yes    Birth control/protection: None    Comment: husband  vasectomy  Other Topics Concern  . Not on file  Social History Narrative  . Not on file   Social Determinants of Health   Financial Resource Strain:   . Difficulty of Paying Living Expenses: Not on file  Food Insecurity:   . Worried About Charity fundraiser in the Last Year: Not on file  . Ran Out of Food in the Last Year: Not on file  Transportation Needs:   . Lack of Transportation (Medical): Not on file  . Lack of Transportation (Non-Medical): Not on file  Physical Activity:   . Days of Exercise per Week: Not on file  . Minutes of Exercise per Session: Not on file  Stress:   .  Feeling of Stress : Not on file  Social Connections:   . Frequency of Communication with Friends and Family: Not on file  . Frequency of Social Gatherings with Friends and Family: Not on file  . Attends Religious Services: Not on file  . Active Member of Clubs or Organizations: Not on file  . Attends Archivist Meetings: Not on file  . Marital Status: Not on file  Intimate Partner Violence:   . Fear of Current or Ex-Partner: Not on file  . Emotionally Abused: Not on file  . Physically Abused: Not on file  . Sexually Abused: Not on file   Current Outpatient Medications  Medication Sig Dispense Refill  . fluticasone (FLONASE) 50 MCG/ACT nasal spray Place into both nostrils daily.    Marland Kitchen levothyroxine (SYNTHROID) 175 MCG tablet Take 1 tablet (175 mcg total) by mouth daily before breakfast. 90 tablet 3  . meclizine (ANTIVERT) 25 MG tablet Take 1 tablet (25 mg total) by mouth 3 (three) times daily as needed for dizziness. 30 tablet 0  . Multiple Vitamins-Minerals (MULTIVITAMIN ADULT EXTRA C PO) multivitamin    . sertraline (ZOLOFT) 50 MG tablet Take 1 tablet (50 mg total) by mouth daily. 90 tablet 1  . traZODone (DESYREL) 50 MG tablet Take at bedtime as directed 90 tablet 1   No current facility-administered medications for this visit.    Allergies  Allergen Reactions  . Penicillins Rash     When she was 45years old, had a rash when took this for strep throat   Family History  Problem Relation Age of Onset  . Cancer Maternal Grandmother   . Heart disease Maternal Grandfather   . Cancer Paternal Grandmother   . Heart disease Paternal Grandfather   . Cancer Mother   . Hyperlipidemia Mother   . Hyperlipidemia Father   . Hypertension Father   Also, breast cancer in mother. + see HPI  PE: BP 110/72   Pulse 91   Ht 5\' 6"  (1.676 m)   Wt 139 lb 6.4 oz (63.2 kg)   SpO2 91%   BMI 22.50 kg/m  Wt Readings from Last 3 Encounters:  06/04/20 139 lb 6.4 oz (63.2 kg)  12/03/19 134 lb (60.8 kg)  07/11/19 135 lb (61.2 kg)   Constitutional: normal weight, in NAD Eyes: PERRLA, EOMI, no exophthalmos ENT: moist mucous membranes, no thyromegaly, no cervical lymphadenopathy Cardiovascular: RRR, No MRG Respiratory: CTA Mcknight Gastrointestinal: abdomen soft, NT, ND, BS+ Musculoskeletal: no deformities, strength intact in all 4 Skin: moist, warm, no rashes Neurological: no tremor with outstretched hands, DTR normal in all 4  ASSESSMENT: 1. Hypothyroidism  PLAN:  1. Patient with longstanding hypothyroidism on Synthroid d.a.w. therapy.  Her TFTs fluctuated mostly in the normal range, however, on 06/21/2019, TSH was elevated and she had hypothyroid symptoms.  At that time, her dose of levothyroxine was increased from 175 mcg to 200 mcg daily.  Since these were higher doses, she was referred to endocrinology.  Since then, we decreased the dose to 150 mcg daily but she felt very tired on this and went back to 175 mcg daily 6 weeks prior to our last visit. -She denies neck compression symptoms.  She had a thyroid ultrasound before moving here from [place] and this showed that the thyroid gland disintegrated.  I explained that this was usually a feature of thyroiditis, possibly Hashimoto's thyroiditis.  However, at last visit, her TPO and ATA antibodies were checked and these were not elevated.  This does not rule out a previous episode of inflammation. - latest thyroid labs reviewed with pt >> normal: Lab Results  Component Value Date   TSH 0.59 01/08/2020   - she continues on LT4 175 mcg daily - pt feels good on this dose.  Fatigue improved after we increase the dose of levothyroxine. - we discussed about taking the thyroid hormone every day, with water, >30 minutes before breakfast, separated by >4 hours from acid reflux medications, calcium, iron, multivitamins. Pt. is taking it correctly. - will check thyroid tests today: TSH and fT4 - If labs are abnormal, she will need to return for repeat TFTs in 1.5 months - OTW, I will see her back in a year  Component     Latest Ref Rng & Units 06/04/2020  TSH     0.35 - 4.50 uIU/mL 1.35  T4,Free(Direct)     0.60 - 1.60 ng/dL 0.98  Normal TFTs.  Philemon Kingdom, MD PhD [facility] Endocrinology

## 2020-06-04 NOTE — Patient Instructions (Signed)
Please continue Levothyroxine 175 mcg.  Take the thyroid hormone every day, with water, at least 30 minutes before breakfast, separated by at least 4 hours from: - acid reflux medications - calcium - iron - multivitamins  Please come back for a follow-up appointment in 1 year.

## 2020-06-05 ENCOUNTER — Encounter: Payer: Self-pay | Admitting: Internal Medicine

## 2020-07-10 IMAGING — DX DG HAND 2V*R*
2 series · 2 of 2 positions shown · non-contrast
Comparison: None.

CLINICAL DATA: Right hand pain.

EXAM:
RIGHT HAND - 2 VIEW

[hand pa]
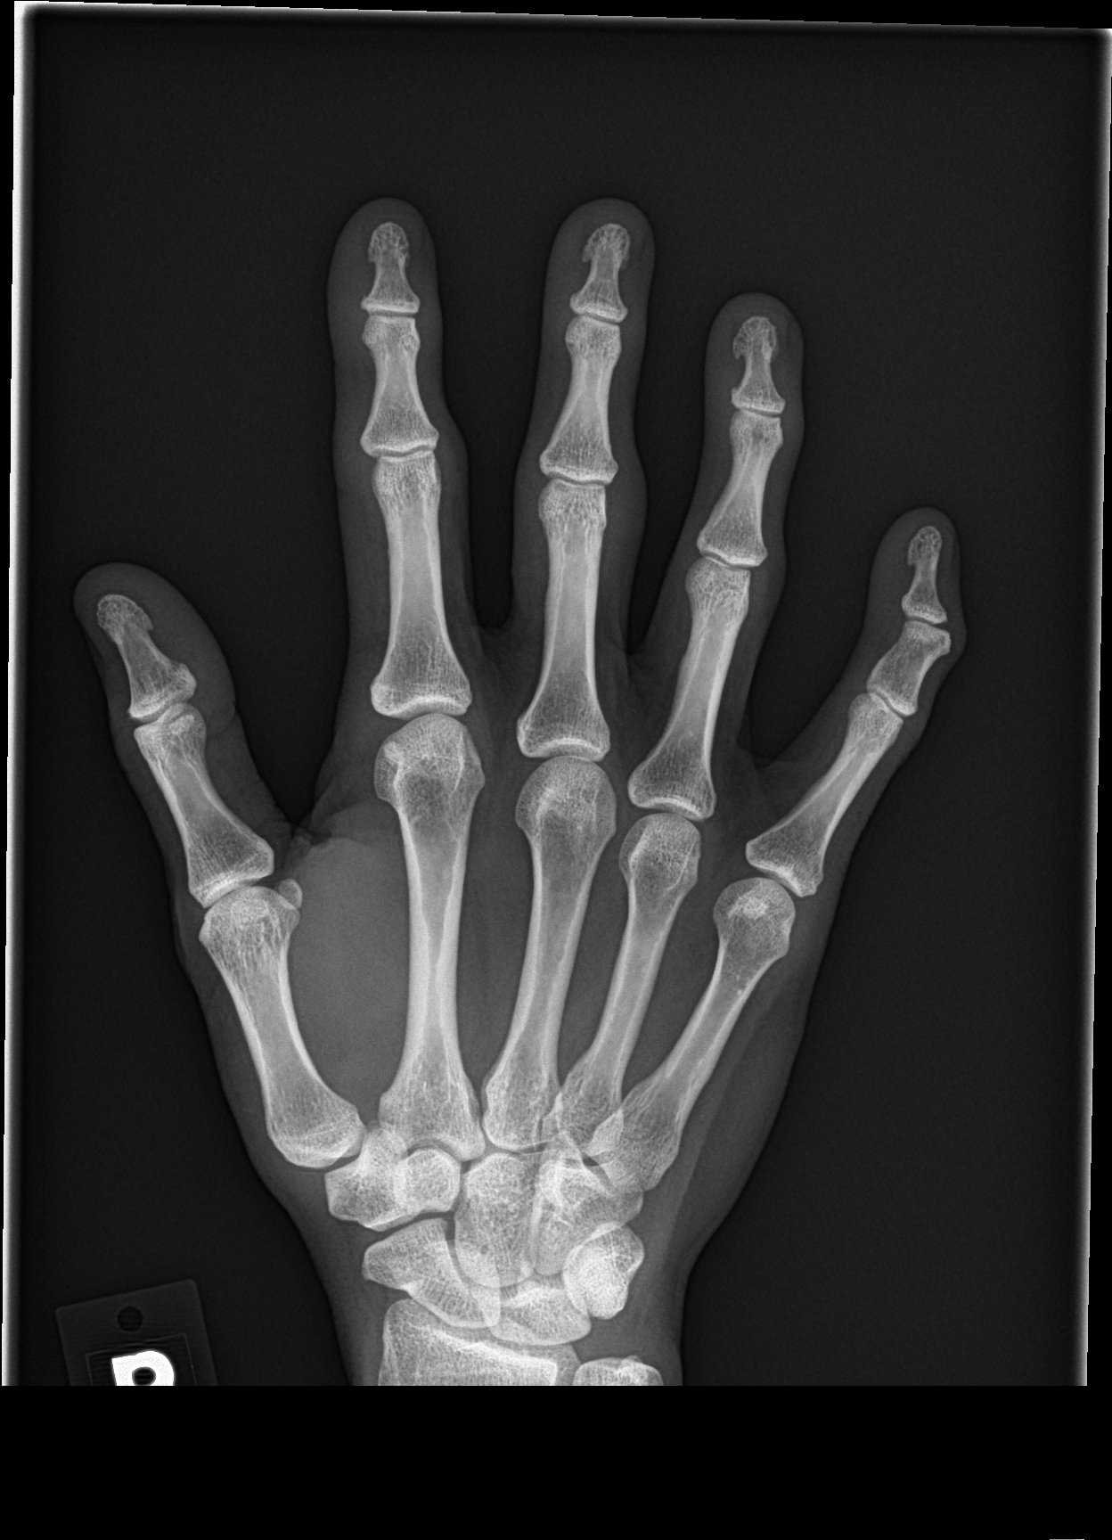

[hand lat]
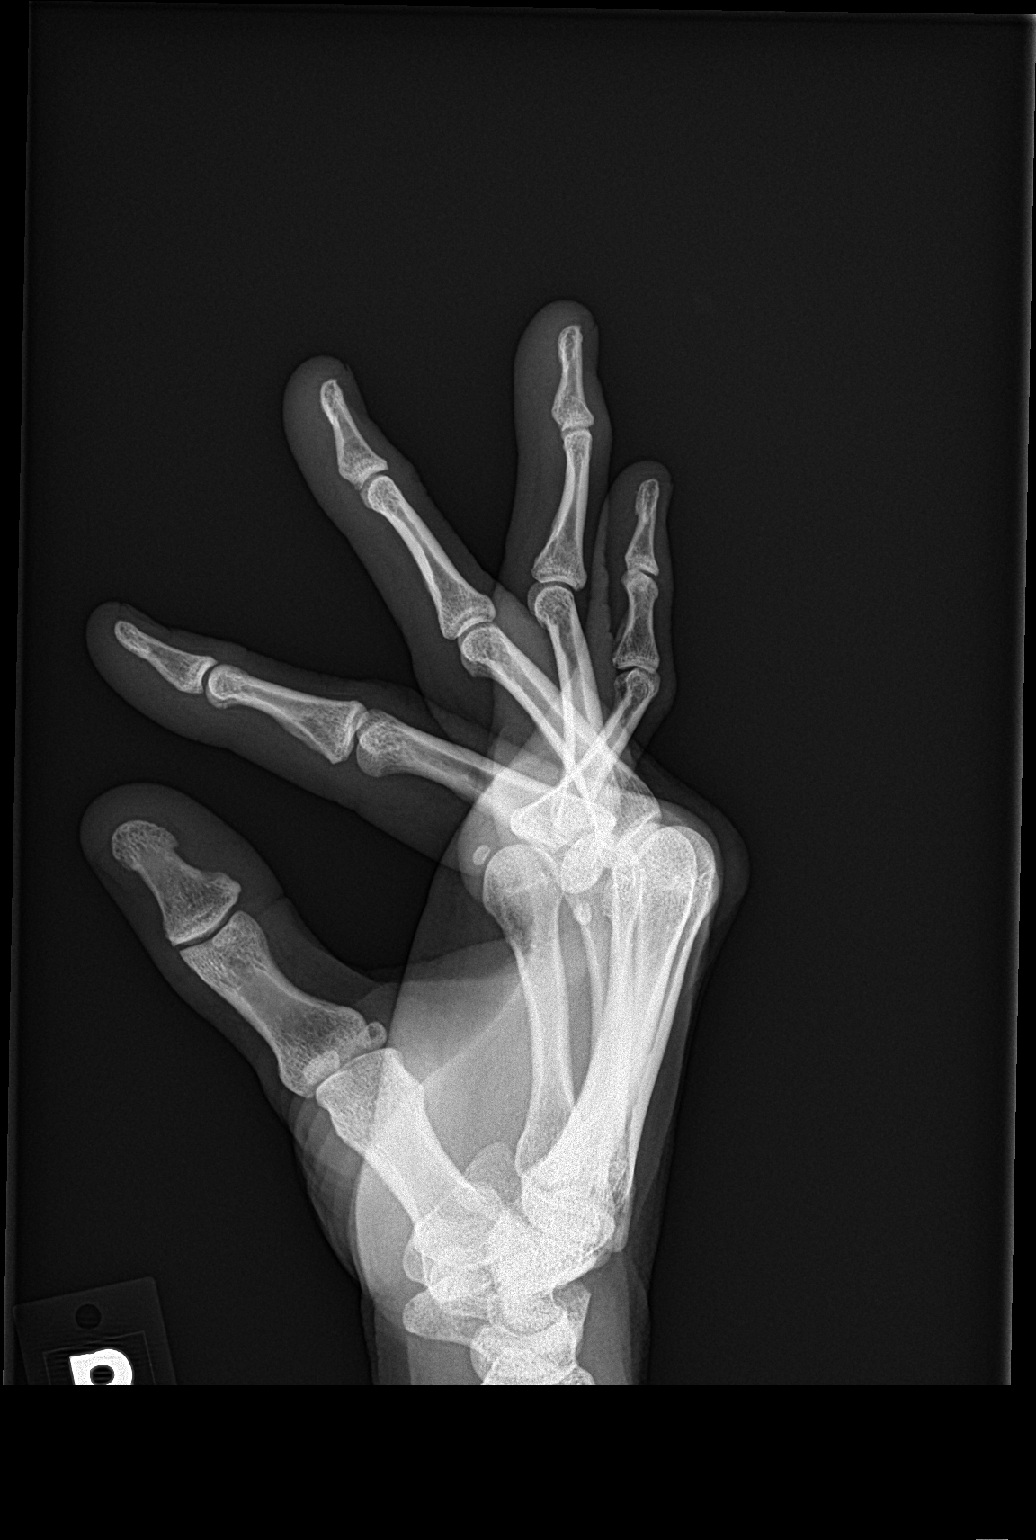

[2 of 2 positions shown; findings below may reference images not displayed]

FINDINGS: The mineralization and alignment are normal. There is no evidence of
acute fracture or dislocation. The joint spaces appear preserved.
Mild angulation at the 5th DIP joint appears developmental. There is
some soft tissue swelling around the interphalangeal joints,
greatest at the 3rd PIP joint. No erosive changes are identified.
IMPRESSION: Mild soft tissue swelling around the interphalangeal joints could be
a manifestation of early arthritis. No joint space narrowing,
erosive changes or acute osseous findings.

## 2020-08-04 DIAGNOSIS — Z01419 Encounter for gynecological examination (general) (routine) without abnormal findings: Secondary | ICD-10-CM | POA: Diagnosis not present

## 2020-08-04 DIAGNOSIS — Z1231 Encounter for screening mammogram for malignant neoplasm of breast: Secondary | ICD-10-CM | POA: Diagnosis not present

## 2020-08-04 DIAGNOSIS — Z6821 Body mass index (BMI) 21.0-21.9, adult: Secondary | ICD-10-CM | POA: Diagnosis not present

## 2020-08-12 ENCOUNTER — Ambulatory Visit: Payer: BC Managed Care – PPO | Admitting: Family Medicine

## 2020-08-12 ENCOUNTER — Encounter: Payer: Self-pay | Admitting: Family Medicine

## 2020-08-12 ENCOUNTER — Other Ambulatory Visit: Payer: Self-pay

## 2020-08-12 VITALS — BP 111/75 | HR 89 | Temp 98.0°F | Ht 66.0 in | Wt 142.0 lb

## 2020-08-12 DIAGNOSIS — L239 Allergic contact dermatitis, unspecified cause: Secondary | ICD-10-CM | POA: Diagnosis not present

## 2020-08-12 DIAGNOSIS — Z13228 Encounter for screening for other metabolic disorders: Secondary | ICD-10-CM | POA: Diagnosis not present

## 2020-08-12 DIAGNOSIS — Z1321 Encounter for screening for nutritional disorder: Secondary | ICD-10-CM | POA: Diagnosis not present

## 2020-08-12 DIAGNOSIS — Z1329 Encounter for screening for other suspected endocrine disorder: Secondary | ICD-10-CM | POA: Diagnosis not present

## 2020-08-12 DIAGNOSIS — E039 Hypothyroidism, unspecified: Secondary | ICD-10-CM | POA: Diagnosis not present

## 2020-08-12 MED ORDER — PREDNISONE 20 MG PO TABS: ORAL_TABLET | ORAL | 0 refills | Status: AC

## 2020-08-12 MED ORDER — TRIAMCINOLONE ACETONIDE 0.1 % EX CREA: 1.0000 "application " | TOPICAL_CREAM | Freq: Two times a day (BID) | CUTANEOUS | 0 refills | Status: DC

## 2020-08-12 NOTE — Patient Instructions (Addendum)
Contact Dermatitis Dermatitis is redness, soreness, and swelling (inflammation) of the skin. Contact dermatitis is a reaction to something that touches the skin. There are two types of contact dermatitis:  Irritant contact dermatitis. This happens when something bothers (irritates) your skin, like soap.  Allergic contact dermatitis. This is caused when you are exposed to something that you are allergic to, such as poison ivy. What are the causes?  Common causes of irritant contact dermatitis include: ? Makeup. ? Soaps. ? Detergents. ? Bleaches. ? Acids. ? Metals, such as nickel.  Common causes of allergic contact dermatitis include: ? Plants. ? Chemicals. ? Jewelry. ? Latex. ? Medicines. ? Preservatives in products, such as clothing. What increases the risk?  Having a job that exposes you to things that bother your skin.  Having asthma or eczema. What are the signs or symptoms? Symptoms may happen anywhere the irritant has touched your skin. Symptoms include:  Dry or flaky skin.  Redness.  Cracks.  Itching.  Pain or a burning feeling.  Blisters.  Blood or clear fluid draining from skin cracks. With allergic contact dermatitis, swelling may occur. This may happen in places such as the eyelids, mouth, or genitals.   How is this treated?  This condition is treated by checking for the cause of the reaction and protecting your skin. Treatment may also include: ? Steroid creams, ointments, or medicines. ? Antibiotic medicines or other ointments, if you have a skin infection. ? Lotion or medicines to help with itching. ? A bandage (dressing). Follow these instructions at home: Skin care  Moisturize your skin as needed.  Put cool cloths on your skin.  Put a baking soda paste on your skin. Stir water into baking soda until it looks like a paste.  Do not scratch your skin.  Avoid having things rub up against your skin.  Avoid the use of soaps, perfumes, and  dyes. Medicines  Take or apply over-the-counter and prescription medicines only as told by your doctor.  If you were prescribed an antibiotic medicine, take or apply it as told by your doctor. Do not stop using it even if your condition starts to get better. Bathing  Take a bath with: ? Epsom salts. ? Baking soda. ? Colloidal oatmeal.  Bathe less often.  Bathe in warm water. Avoid using hot water. Bandage care  If you were given a bandage, change it as told by your health care provider.  Wash your hands with soap and water before and after you change your bandage. If soap and water are not available, use hand sanitizer. General instructions  Avoid the things that caused your reaction. If you do not know what caused it, keep a journal. Write down: ? What you eat. ? What skin products you use. ? What you drink. ? What you wear in the area that has symptoms. This includes jewelry.  Check the affected areas every day for signs of infection. Check for: ? More redness, swelling, or pain. ? More fluid or blood. ? Warmth. ? Pus or a bad smell.  Keep all follow-up visits as told by your doctor. This is important. Contact a doctor if:  You do not get better with treatment.  Your condition gets worse.  You have signs of infection, such as: ? More swelling. ? Tenderness. ? More redness. ? Soreness. ? Warmth.  You have a fever.  You have new symptoms. Get help right away if:  You have a very bad headache.  You have  neck pain.  Your neck is stiff.  You throw up (vomit).  You feel very sleepy.  You see red streaks coming from the area.  Your bone or joint near the area hurts after the skin has healed.  The area turns darker.  You have trouble breathing. Summary  Dermatitis is redness, soreness, and swelling of the skin.  Symptoms may occur where the irritant has touched you.  Treatment may include medicines and skin care.  If you do not know what caused  your reaction, keep a journal.  Contact a doctor if your condition gets worse or you have signs of infection. This information is not intended to replace advice given to you by your health care provider. Make sure you discuss any questions you have with your health care provider. Document Revised: 08/30/2018 Document Reviewed: 11/23/2017 Elsevier Patient Education  [employer].

## 2020-08-12 NOTE — Progress Notes (Signed)
3/22/20222:50 PM  Crystal Mcknight 05/29/1975, 45 y.o., female [id number]  Chief Complaint  Patient presents with  . Rash    Mainly on chin , legs and arms- was working in yard this weekend, has spread over past few days    HPI:   Patient is a 45 y.o. female with past medical history significant for hypothyroid & PTSD who presents today for rash.  Rash started on Sunday evening Had been doing work outdoors on Saturday and Sunday Started on arms and now has spread to legs  Extremely itchy Doesn't seem to be getting better Has never had a rash like this in the past Her pets do sleep in her bed with her Has been using rubbing alcohol Had been using calamine lotion Took benadryl last night Taking zyrtec during the day  Denies new medications or food   Depression screen [facility] 2/9 08/12/2020 07/09/2019 06/27/2018  Decreased Interest 0 2 0  Down, Depressed, Hopeless 0 1 0  PHQ - 2 Score 0 3 0  Altered sleeping - 1 -  Tired, decreased energy - 3 -  Change in appetite - 2 -  Feeling bad or failure about yourself  - 1 -  Trouble concentrating - 1 -  Moving slowly or fidgety/restless - 1 -  Suicidal thoughts - 0 -  PHQ-9 Score - 12 -  Some encounter information is confidential and restricted. Go to Review Flowsheets activity to see all data.    Fall Risk  08/12/2020 07/09/2019 06/27/2018 03/24/2018 03/18/2016  Falls in the past year? 0 0 1 0 No  Number falls in past yr: 0 - 0 - -  Injury with Fall? 0 - 1 - -  Comment - - right hand index,middle,ring finger - -  Follow up Falls evaluation completed Falls evaluation completed Falls evaluation completed - -     Allergies  Allergen Reactions  . Penicillins Rash    When she was 45years old, had a rash when took this for strep throat    Prior to Admission medications   Medication Sig Start Date End Date Taking? Authorizing Provider  fluticasone (FLONASE) 50 MCG/ACT nasal spray Place into both nostrils daily.    [provider]  levothyroxine (SYNTHROID) 175 MCG tablet Take 1 tablet (175 mcg total) by mouth daily before breakfast. 12/03/19   Philemon Kingdom, MD  meclizine (ANTIVERT) 25 MG tablet Take 1 tablet (25 mg total) by mouth 3 (three) times daily as needed for dizziness. 07/09/19   Horald Pollen, MD  Multiple Vitamins-Minerals (MULTIVITAMIN ADULT EXTRA C PO) multivitamin    [provider]  sertraline (ZOLOFT) 50 MG tablet Take 1 tablet (50 mg total) by mouth daily. 08/02/18 01/29/19  Dara Hoyer, PA-C  traZODone (DESYREL) 50 MG tablet Take at bedtime as directed 08/02/18   Sherlynn Stalls    Past Medical History:  Diagnosis Date  . Arthritis    hands  . Depression   . Hypothyroidism   . Pelvic pain   . PONV (postoperative nausea and vomiting)   . SVD (spontaneous vaginal delivery)    x 2  . Thyroid disease     Past Surgical History:  Procedure Laterality Date  . APPENDECTOMY    . LAPAROSCOPY N/A 05/31/2013   Procedure: LAPAROSCOPY OPERATIVE, FULGURATION OF ENDOMETRIOSIS, REMOVAL OF BILATERAL FALLOPIAN TUBE CYST;  Surgeon: Marylynn Pearson, MD;  Location: [facility];  Service: Gynecology;  Laterality: N/A;  . RHINOPLASTY    . WISDOM TOOTH  EXTRACTION      Social History   Tobacco Use  . Smoking status: Never Smoker  . Smokeless tobacco: Never Used  Substance Use Topics  . Alcohol use: Yes    Alcohol/week: 7.0 - 14.0 standard drinks    Types: 7 - 14 Glasses of wine per week    Comment: 1-2 glasses wind daily    Family History  Problem Relation Age of Onset  . Cancer Maternal Grandmother   . Heart disease Maternal Grandfather   . Cancer Paternal Grandmother   . Heart disease Paternal Grandfather   . Cancer Mother   . Hyperlipidemia Mother   . Hyperlipidemia Father   . Hypertension Father     Review of Systems  Constitutional: Negative for chills, fever and malaise/fatigue.  Respiratory: Negative for cough, sputum production, shortness of breath and wheezing.    Cardiovascular: Negative for chest pain and palpitations.  Musculoskeletal: Negative for back pain and myalgias.  Skin: Positive for itching and rash.  Neurological: Negative for dizziness and headaches.     OBJECTIVE:  Today's Vitals   08/12/20 1419  BP: 111/75  Pulse: 89  Temp: 98 F (36.7 C)  Weight: 142 lb (64.4 kg)  Height: 5\' 6"  (1.676 m)   Body mass index is 22.92 kg/m.   Physical Exam Constitutional:      General: She is not in acute distress.    Appearance: Normal appearance. She is not ill-appearing.  HENT:     Head: Normocephalic.  Cardiovascular:     Rate and Rhythm: Normal rate and regular rhythm.     Pulses: Normal pulses.     Heart sounds: Normal heart sounds. No murmur heard. No friction rub. No gallop.   Pulmonary:     Effort: Pulmonary effort is normal. No respiratory distress.     Breath sounds: Normal breath sounds. No stridor. No wheezing, rhonchi or rales.  Abdominal:     General: Bowel sounds are normal.     Palpations: Abdomen is soft.     Tenderness: There is no abdominal tenderness.  Musculoskeletal:     Right lower leg: No edema.     Left lower leg: No edema.  Skin:    General: Skin is warm and dry.     Findings: Rash (Scattered, small lesions, errythema) present.  Neurological:     Mental Status: She is alert and oriented to person, place, and time.  Psychiatric:        Mood and Affect: Mood normal.        Behavior: Behavior normal.     No results found for this or any previous visit (from the past 24 hour(s)).  No results found.   ASSESSMENT and PLAN  Problem List Items Addressed This Visit   None   Visit Diagnoses    Allergic contact dermatitis, unspecified trigger    -  Primary   Relevant Medications   predniSONE (DELTASONE) 20 MG tablet   triamcinolone (KENALOG) 0.1 %      Plan . Prednisone taper . Triamcinolone cream as needed or OTC hydrocortisone for itching . Continue Benadryl and zyrtec as  needed  Continue OTC treatments for itching  RTC/ED precautions provided  Return if symptoms worsen or fail to improve.   Huston Foley Zea Kostka, FNP-BC [facility] [place], [place] [address] Ph.  [phone] Fax [phone]

## 2020-08-15 ENCOUNTER — Encounter: Payer: Self-pay | Admitting: Internal Medicine

## 2020-08-15 ENCOUNTER — Other Ambulatory Visit: Payer: Self-pay | Admitting: Internal Medicine

## 2020-08-15 DIAGNOSIS — E039 Hypothyroidism, unspecified: Secondary | ICD-10-CM

## 2020-08-15 MED ORDER — LEVOTHYROXINE SODIUM 150 MCG PO TABS: 150.0000 ug | ORAL_TABLET | Freq: Every day | ORAL | 5 refills | Status: DC

## 2020-08-15 NOTE — Progress Notes (Signed)
Received labs from [facility] -checked 08/12/2020: TSH 0.349 (0.45-4.5) Vitamin D 69.4 HbA1c 5.5% Lipids: 16/92/51/88-fasting Glucose 89, BUN/creatinine 12/0.81, GFR 91, with the rest of the CMP normal  We will decrease her levothyroxine dose to 150 mcg daily and recheck her TSH again in 1.5 months.

## 2020-09-04 ENCOUNTER — Other Ambulatory Visit: Payer: Self-pay | Admitting: Internal Medicine

## 2020-09-04 DIAGNOSIS — E039 Hypothyroidism, unspecified: Secondary | ICD-10-CM

## 2020-10-15 ENCOUNTER — Other Ambulatory Visit (INDEPENDENT_AMBULATORY_CARE_PROVIDER_SITE_OTHER): Payer: BC Managed Care – PPO

## 2020-10-15 ENCOUNTER — Other Ambulatory Visit: Payer: Self-pay

## 2020-10-15 DIAGNOSIS — E039 Hypothyroidism, unspecified: Secondary | ICD-10-CM

## 2020-10-16 ENCOUNTER — Encounter: Payer: Self-pay | Admitting: Internal Medicine

## 2020-10-16 DIAGNOSIS — F419 Anxiety disorder, unspecified: Secondary | ICD-10-CM | POA: Diagnosis not present

## 2020-10-16 LAB — T4, FREE: Free T4: 1.13 ng/dL (ref 0.60–1.60)

## 2020-10-16 LAB — TSH: TSH: 0.22 u[IU]/mL — ABNORMAL LOW (ref 0.35–4.50)

## 2020-10-30 DIAGNOSIS — F419 Anxiety disorder, unspecified: Secondary | ICD-10-CM | POA: Diagnosis not present

## 2020-11-10 DIAGNOSIS — F419 Anxiety disorder, unspecified: Secondary | ICD-10-CM | POA: Diagnosis not present

## 2020-12-04 ENCOUNTER — Other Ambulatory Visit (INDEPENDENT_AMBULATORY_CARE_PROVIDER_SITE_OTHER): Payer: BC Managed Care – PPO

## 2020-12-04 ENCOUNTER — Other Ambulatory Visit: Payer: Self-pay

## 2020-12-04 DIAGNOSIS — E039 Hypothyroidism, unspecified: Secondary | ICD-10-CM | POA: Diagnosis not present

## 2020-12-04 LAB — TSH: TSH: 0.91 u[IU]/mL (ref 0.35–5.50)

## 2020-12-04 LAB — T4, FREE: Free T4: 1 ng/dL (ref 0.60–1.60)

## 2021-02-04 ENCOUNTER — Other Ambulatory Visit: Payer: BC Managed Care – PPO

## 2021-02-04 ENCOUNTER — Other Ambulatory Visit: Payer: Self-pay

## 2021-02-04 ENCOUNTER — Telehealth: Payer: Self-pay

## 2021-02-04 DIAGNOSIS — E039 Hypothyroidism, unspecified: Secondary | ICD-10-CM

## 2021-02-04 NOTE — Telephone Encounter (Signed)
Pt requesting a lab appt to have her thyroid levels rechecked. Pt states she is not feeling too well. Please advise.

## 2021-02-04 NOTE — Telephone Encounter (Signed)
Orders placed. Pt advised via MyChart.

## 2021-02-04 NOTE — Telephone Encounter (Signed)
T, Can you plz order a tsh and free t4 for her? She can come any time. Ty! C

## 2021-02-12 ENCOUNTER — Other Ambulatory Visit: Payer: BC Managed Care – PPO

## 2021-02-12 DIAGNOSIS — F419 Anxiety disorder, unspecified: Secondary | ICD-10-CM | POA: Diagnosis not present

## 2021-02-17 DIAGNOSIS — F419 Anxiety disorder, unspecified: Secondary | ICD-10-CM | POA: Diagnosis not present

## 2021-02-23 ENCOUNTER — Other Ambulatory Visit: Payer: BC Managed Care – PPO

## 2021-02-23 DIAGNOSIS — E039 Hypothyroidism, unspecified: Secondary | ICD-10-CM

## 2021-02-23 LAB — TSH: TSH: 1.06 u[IU]/mL (ref 0.35–5.50)

## 2021-02-23 LAB — T4, FREE: Free T4: 0.94 ng/dL (ref 0.60–1.60)

## 2021-02-24 DIAGNOSIS — F419 Anxiety disorder, unspecified: Secondary | ICD-10-CM | POA: Diagnosis not present

## 2021-06-01 DIAGNOSIS — H5212 Myopia, left eye: Secondary | ICD-10-CM | POA: Diagnosis not present

## 2021-06-01 DIAGNOSIS — H04123 Dry eye syndrome of bilateral lacrimal glands: Secondary | ICD-10-CM | POA: Diagnosis not present

## 2021-06-03 NOTE — Progress Notes (Signed)
Patient ID: Crystal Mcknight, female   DOB: Aug 17, 1975, 46 y.o.   MRN: [id number]   This visit occurred during the SARS-CoV-2 public health emergency.  Safety protocols were in place, including screening questions prior to the visit, additional usage of staff PPE, and extensive cleaning of exam room while observing appropriate contact time as indicated for disinfecting solutions.   HPI  Crystal Mcknight is a 46 y.o.-year-old female, initially referred by her PCP, Chrzanowski, Jami B, NP returning for follow-up for of hypothyroidism. She moved to [place] from [place] in 2009.  She was followed by endocrinology there.  Last visit with me 1 year ago.  Interim history: She feels well recently, without significant fatigue.  Also, no constipation or significant weight fluctuations.  Reviewed and addended history: Pt. has been dx with hypothyroidism in 1990s (at 62-19 y/o). She also has a h/o postpartum thyroiditis after both pregnancies - last after the birth of her son.  She had a thyroid ultrasound before moving from [place] and it was mentioned that her thyroid was not visible anymore.  She was previously on Synthroid DAW 175 + 25 mcg, dose increased 06/21/2019.  At that time, she saw her OB/GYN Dr. and she had several symptoms including fatigue, constipation, hair loss. A TSH was checked and it was high.  2 days prior to our appointment in 06/2019 she woke up with vertigo >> saw PCP >> TFTs repeated: normal. She had vertigo 12 years ago >> investigated by MRI >> no pathology but she had appendicitis few days later so the vertigo was attributed to the acute episode.  We previously decreased the dose of her levothyroxine to 150 mcg daily as her TSH was suppressed. She felt terrible on this dose (VERY tired) >> she increased to 175 mcg daily and her fatigue improved.  Since TFTs returns normal, we continued the same dose.  However, afterwards, her TSH returned suppressed and I recommend decrease the  dose to 150 mcg daily.  Due to previous fatigue on this dose, she increased it back to 175 mcg daily.  Since TSH remained suppressed, again advised her to switch 150 mcg daily. She is now on this dose.  She takes the LT4 150 mcg daily: - in am (around 5-6 AM) - fasting - at least 30 min from b'fast - no calcium - no iron - + multivitamins, Mg, vitamin D - at night - + Turmeric and B complex - 2 hours after LT4 - no PPIs  Reviewed her TFTs: Lab Results  Component Value Date   TSH 1.06 02/23/2021   TSH 0.91 12/04/2020   TSH 0.22 (L) 10/15/2020   TSH 1.35 06/04/2020   TSH 0.59 01/08/2020   TSH 3.93 11/29/2019   TSH 0.02 (L) 09/19/2019   TSH 0.33 (L) 08/16/2019   TSH 0.538 07/09/2019   TSH 3.890 06/05/2018   FREET4 0.94 02/23/2021   FREET4 1.00 12/04/2020   FREET4 1.13 10/15/2020   FREET4 0.98 06/04/2020   FREET4 0.90 01/08/2020   FREET4 0.87 11/29/2019   FREET4 1.48 09/19/2019   FREET4 1.14 08/16/2019   Prev: Received labs from [facility] -checked 08/12/2020: TSH 0.349 (0.45-4.5) Vitamin D 69.4 HbA1c 5.5% Lipids: 16/92/51/88-fasting Glucose 89, BUN/creatinine 12/0.81, GFR 91, with the rest of the CMP normal We decreased  her levothyroxine dose to 150 mcg daily.  06/21/2019: TSH 7.120 (0.45-4.5), total T4 7.2 (4.5-12) - dose increased from 175 to 200 mcg daily 07/29/2018: TSH 3.29  Her antithyroid antibodies were not elevated:  Component     Latest Ref Rng & Units 07/11/2019  Thyroperoxidase Ab SerPl-aCnc     <9 IU/mL 1  Thyroglobulin Ab     < or = 1 IU/mL <1   In 2021, due to the increased levothyroxine requirements and also due to the fact that she was complaining of bloating and she had family history of autoimmune diseases, we tested her for celiac disease.  The investigation was negative: Component     Latest Ref Rng & Units 07/11/2019  Immunoglobulin A     47 - 310 mg/dL 159  (tTG) Ab, IgG     U/mL 3  (tTG) Ab, IgA     U/mL 1  Gliadin IgA     Units  9  Deamidated Gliadin Abs, IgG     Units 1   Vitamin D level was normal: 08/12/2020: Vitamin D 69.4. Lab Results  Component Value Date   VD25OH 48.03 07/11/2019  She takes vitamin D.  In the past, she described fatigue, cold intolerance, constipation, hair loss on forearms, anxiety/depression.  She had normal menses but with increased flow and also history of endometriosis with increased cramping.  She continues to see OB/GYN.  Pt denies: - feeling nodules in neck - hoarseness - dysphagia - choking - SOB with lying down  She has + FH of thyroid disorders in: father and P aunts. Father and aunts with SLE.No FH of thyroid cancer. No h/o radiation tx to head or neck. No herbal supplements. No Biotin use. No recent steroids use.   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  ROS: + see HPI  I reviewed pt's medications, allergies, PMH, social hx, family hx, and changes were documented in the history of present illness. Otherwise, unchanged from my initial visit note.  Past Medical History:  Diagnosis Date   Arthritis    hands   Depression    Hypothyroidism    Pelvic pain    PONV (postoperative nausea and vomiting)    SVD (spontaneous vaginal delivery)    x 2   Thyroid disease    Past Surgical History:  Procedure Laterality Date   APPENDECTOMY     LAPAROSCOPY N/A 05/31/2013   Procedure: LAPAROSCOPY OPERATIVE, FULGURATION OF ENDOMETRIOSIS, REMOVAL OF BILATERAL FALLOPIAN TUBE CYST;  Surgeon: Marylynn Pearson, MD;  Location: [facility];  Service: Gynecology;  Laterality: N/A;   RHINOPLASTY     WISDOM TOOTH EXTRACTION     Social History   Socioeconomic History   Marital status: Married    Spouse name: Not on file   Number of children: 2   Years of education: Not on file   Highest education level: Not on file  Occupational History   Not on file  Tobacco Use   Smoking status: Never Smoker   Smokeless tobacco: Never Used  Substance  and Sexual Activity   Alcohol use: No, stopped   Drug use: No   Sexual activity: Yes    Birth control/protection: None    Comment: husband vasectomy  Other Topics Concern   Not on file  Social History Narrative   Not on file   Social Determinants of Health   Financial Resource Strain:    Difficulty of Paying Living Expenses: Not on file  Food Insecurity:    Worried About [place] in the Last Year: Not on file   [employer] of Food in the Last Year: Not on file  Transportation Needs:  Lack of Transportation (Medical): Not on file   Lack of Transportation (Non-Medical): Not on file  Physical Activity:    Days of Exercise per Week: Not on file   Minutes of Exercise per Session: Not on file  Stress:    Feeling of Stress : Not on file  Social Connections:    Frequency of Communication with Friends and Family: Not on file   Frequency of Social Gatherings with Friends and Family: Not on file   Attends Religious Services: Not on file   Active Member of Clubs or Organizations: Not on file   Attends Archivist Meetings: Not on file   Marital Status: Not on file  Intimate Partner Violence:    Fear of Current or Ex-Partner: Not on file   Emotionally Abused: Not on file   Physically Abused: Not on file   Sexually Abused: Not on file   Current Outpatient Medications  Medication Sig Dispense Refill   fluticasone (FLONASE) 50 MCG/ACT nasal spray Place into both nostrils daily.     levothyroxine (SYNTHROID) 150 MCG tablet Take 1 tablet (150 mcg total) by mouth daily before breakfast. 45 tablet 5   meclizine (ANTIVERT) 25 MG tablet Take 1 tablet (25 mg total) by mouth 3 (three) times daily as needed for dizziness. (Patient not taking: Reported on 08/12/2020) 30 tablet 0   Multiple Vitamins-Minerals (MULTIVITAMIN ADULT EXTRA C PO) multivitamin     sertraline (ZOLOFT) 50 MG tablet Take 1 tablet (50 mg total) by mouth daily. 90 tablet 1   traZODone (DESYREL) 50 MG tablet  Take at bedtime as directed 90 tablet 1   triamcinolone (KENALOG) 0.1 % Apply 1 application topically 2 (two) times daily. 30 g 0   No current facility-administered medications for this visit.    Allergies  Allergen Reactions   Penicillins Rash    When she was 46years old, had a rash when took this for strep throat   Family History  Problem Relation Age of Onset   Cancer Maternal Grandmother    Heart disease Maternal Grandfather    Cancer Paternal Grandmother    Heart disease Paternal Grandfather    Cancer Mother    Hyperlipidemia Mother    Hyperlipidemia Father    Hypertension Father   Also, breast cancer in mother. + see HPI  PE: BP 120/70 (BP Location: Right Arm, Patient Position: Sitting, Cuff Size: Normal)    Pulse 78    Ht 5\' 6"  (1.676 m)    Wt 136 lb 9.6 oz (62 kg)    SpO2 97%    BMI 22.05 kg/m  Wt Readings from Last 3 Encounters:  06/04/21 136 lb 9.6 oz (62 kg)  08/12/20 142 lb (64.4 kg)  06/04/20 139 lb 6.4 oz (63.2 kg)   Constitutional: normal weight, in NAD Eyes: PERRLA, EOMI, no exophthalmos ENT: moist mucous membranes, no thyromegaly, no cervical lymphadenopathy Cardiovascular: RRR, No MRG Respiratory: CTA B Musculoskeletal: no deformities, strength intact in all 4 Skin: moist, warm, no rashes Neurological: no tremor with outstretched hands, DTR normal in all 4  ASSESSMENT: 1. Hypothyroidism  PLAN:  1. Patient with longstanding hypothyroidism, on Synthroid d.a.w. therapy.  Her TFTs fluctuated mostly in the normal range, however, in 05/2019, TSH was elevated and she had hypothyroid symptoms.  At that time, her levothyroxine dose was increased from 175 to 200 mcg daily.  She was also referred to endocrinology afterwards.  Since then, we were able to decrease the dose to 150 mcg daily but  she felt very tired on this and went back to the 175 mcg dose.  Afterwards, TSH was suppressed and I advised her to decrease the dose to 150 mcg daily.  She wanted to continue  with the 175 mcg as she was feeling better on this but the repeat TSH was still low.  At that time, we decided to decrease the dose to 150 mcg daily -she is now on this dose -She has no neck compression symptoms.  Interestingly, a thyroid ultrasound obtained when she was still in [place] showed that the thyroid gland is integrated.  We discussed that this may be a feature of thyroiditis, positive for Hashimoto's thyroiditis.  However, we checked her TPO and ATA antibodies and they were not elevated. - latest thyroid labs reviewed with pt. >> normal: Lab Results  Component Value Date   TSH 1.06 02/23/2021  - pt feels good on 150 mcg daily of Synthroid, without hypothyroid complaints. - we discussed about taking the thyroid hormone every day, with water, >30 minutes before breakfast, separated by >4 hours from acid reflux medications, calcium, iron, multivitamins. Pt. is taking it correctly. - will check thyroid tests in 1-2 months, and advised her to skip her B complex (30 mcg biotin) for 2 days before the test: TSH and fT4 - If labs are abnormal, she will need to return for repeat TFTs in 1.5 months -Otherwise, I will see her back in a year  Orders Placed This Encounter  Procedures   TSH   T4, free    Philemon Kingdom, MD PhD [facility]

## 2021-06-04 ENCOUNTER — Other Ambulatory Visit: Payer: Self-pay

## 2021-06-04 ENCOUNTER — Encounter: Payer: Self-pay | Admitting: Internal Medicine

## 2021-06-04 ENCOUNTER — Ambulatory Visit: Payer: BC Managed Care – PPO | Admitting: Internal Medicine

## 2021-06-04 VITALS — BP 120/70 | HR 78 | Ht 66.0 in | Wt 136.6 lb

## 2021-06-04 DIAGNOSIS — E039 Hypothyroidism, unspecified: Secondary | ICD-10-CM

## 2021-06-04 NOTE — Patient Instructions (Addendum)
Please continue Levothyroxine 150 mcg.  Take the thyroid hormone every day, with water, at least 30 minutes before breakfast, separated by at least 4 hours from: - acid reflux medications - calcium - iron - multivitamins  Stop Biotin before next set of labs in 1-2 months.  Please come back for a follow-up appointment in 1 year.

## 2021-06-20 ENCOUNTER — Encounter (HOSPITAL_COMMUNITY): Payer: Self-pay | Admitting: Emergency Medicine

## 2021-06-20 ENCOUNTER — Emergency Department (HOSPITAL_COMMUNITY)
Admission: EM | Admit: 2021-06-20 | Discharge: 2021-06-20 | Disposition: A | Discharge status: Home / Self Care | Payer: BC Managed Care – PPO | Attending: Emergency Medicine | Admitting: Emergency Medicine

## 2021-06-20 ENCOUNTER — Other Ambulatory Visit: Payer: Self-pay

## 2021-06-20 DIAGNOSIS — S61451A Open bite of right hand, initial encounter: Secondary | ICD-10-CM | POA: Diagnosis not present

## 2021-06-20 DIAGNOSIS — S61411A Laceration without foreign body of right hand, initial encounter: Secondary | ICD-10-CM | POA: Diagnosis not present

## 2021-06-20 DIAGNOSIS — S61214A Laceration without foreign body of right ring finger without damage to nail, initial encounter: Secondary | ICD-10-CM | POA: Diagnosis not present

## 2021-06-20 DIAGNOSIS — W540XXA Bitten by dog, initial encounter: Secondary | ICD-10-CM | POA: Insufficient documentation

## 2021-06-20 MED ORDER — DOXYCYCLINE HYCLATE 100 MG PO TABS
100.0000 mg | ORAL_TABLET | Freq: Once | ORAL | Status: AC
  Administered 2021-06-20: 100 mg via ORAL
  Filled 2021-06-20: qty 1

## 2021-06-20 MED ORDER — CLINDAMYCIN HCL 300 MG PO CAPS: 300.0000 mg | ORAL_CAPSULE | Freq: Three times a day (TID) | ORAL | 0 refills | Status: AC

## 2021-06-20 MED ORDER — DOXYCYCLINE HYCLATE 100 MG PO CAPS: 100.0000 mg | ORAL_CAPSULE | Freq: Two times a day (BID) | ORAL | 0 refills | Status: AC

## 2021-06-20 MED ORDER — TETANUS-DIPHTH-ACELL PERTUSSIS 5-2.5-18.5 LF-MCG/0.5 IM SUSY
0.5000 mL | PREFILLED_SYRINGE | Freq: Once | INTRAMUSCULAR | Status: DC
  Filled 2021-06-20: qty 0.5

## 2021-06-20 MED ORDER — FLUCONAZOLE 200 MG PO TABS: 200.0000 mg | ORAL_TABLET | Freq: Every day | ORAL | 0 refills | Status: AC

## 2021-06-20 MED ORDER — BACITRACIN ZINC 500 UNIT/GM EX OINT
TOPICAL_OINTMENT | Freq: Two times a day (BID) | CUTANEOUS | Status: DC
  Filled 2021-06-20: qty 1.8
  Filled 2021-06-20: qty 0.9

## 2021-06-20 MED ORDER — LIDOCAINE HCL 2 % IJ SOLN
10.0000 mL | Freq: Once | INTRAMUSCULAR | Status: AC
  Administered 2021-06-20: 200 mg
  Filled 2021-06-20: qty 20

## 2021-06-20 MED ORDER — CLINDAMYCIN HCL 300 MG PO CAPS
300.0000 mg | ORAL_CAPSULE | Freq: Once | ORAL | Status: AC
  Administered 2021-06-20: 300 mg via ORAL
  Filled 2021-06-20: qty 1

## 2021-06-20 NOTE — ED Notes (Signed)
Pt A&OX4 ambulatory at d/c with independent steady gait, NAD. For secondary assessment see PAs notes. Pt verbalized understanding of d/c instructions and follow up care.

## 2021-06-20 NOTE — ED Provider Notes (Signed)
[facility] DEPT Provider Note   CSN: [id number] Arrival date & time: 06/20/21  1733     History  Chief Complaint  Patient presents with   Animal Bite    Crystal Mcknight is a 46 y.o. female who presents to the ED for evaluation of a dog bite that occurred several hours prior to arrival.  Patient states that one of her friends dogs attacked another dog and when she tried to separate them a friend's dog bit her hand.  She has a laceration to the right ring finger approximately 1 to 2 cm long.  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  She did not feel pain at the time, but was feeling very uncomfortable right now.  She is up-to-date on her tetanus.  The dog is up-to-date on all vaccines.  She has no other complaints   Animal Bite Associated symptoms: no fever and no rash       Home Medications Prior to Admission medications   Medication Sig Start Date End Date Taking? Authorizing Provider  fluticasone (FLONASE) 50 MCG/ACT nasal spray Place into both nostrils daily.    [provider]  levothyroxine (SYNTHROID) 150 MCG tablet Take 1 tablet (150 mcg total) by mouth daily before breakfast. 08/15/20   Philemon Kingdom, MD  Multiple Vitamins-Minerals (MULTIVITAMIN ADULT EXTRA C PO) multivitamin    [provider]  sertraline (ZOLOFT) 50 MG tablet Take 1 tablet (50 mg total) by mouth daily. 08/02/18 01/29/19  Dara Hoyer, PA-C  traZODone (DESYREL) 50 MG tablet Take at bedtime as directed 08/02/18   Dara Hoyer, PA-C      Allergies    Penicillins    Review of Systems   Review of Systems  Constitutional:  Negative for fever.  HENT: Negative.    Eyes: Negative.   Respiratory:  Negative for shortness of breath.   Cardiovascular: Negative.   Gastrointestinal:  Negative for abdominal pain and vomiting.  Endocrine: Negative.   Genitourinary: Negative.   Musculoskeletal:  Negative.   Skin:  Positive for wound. Negative for rash.  Neurological:  Negative for headaches.  All other systems reviewed and are negative.  Physical Exam Updated Vital Signs BP 116/79 (BP Location: Right Arm)    Pulse (!) 101    Temp 98.5 F (36.9 C) (Oral)    Resp 18    SpO2 100%  Physical Exam Vitals and nursing note reviewed.  Constitutional:      General: She is not in acute distress.    Appearance: She is not ill-appearing.  HENT:     Head: Atraumatic.  Eyes:     Conjunctiva/sclera: Conjunctivae normal.  Cardiovascular:     Rate and Rhythm: Normal rate and regular rhythm.     Pulses: Normal pulses.     Heart sounds: No murmur heard. Pulmonary:     Effort: Pulmonary effort is normal. No respiratory distress.     Breath sounds: Normal breath sounds.  Abdominal:     General: Abdomen is flat. There is no distension.     Palpations: Abdomen is soft.     Tenderness: There is no abdominal tenderness.  Musculoskeletal:        General: Normal range of motion.     Cervical back: Normal range of motion.  Skin:    General: Skin is warm and dry.     Capillary Refill: Capillary refill takes less than 2 seconds.  Comments: Right ring finger with approximately 1 cm laceration on the medial aspect.  No tendon involvement.  Sensation is intact.  Good thumb to finger opposition.  Neurological:     General: No focal deficit present.     Mental Status: She is alert.  Psychiatric:        Mood and Affect: Mood normal.    ED Results / Procedures / Treatments   Labs (all labs ordered are listed, but only abnormal results are displayed) Labs Reviewed - No data to display  EKG None  Radiology No results found.  Procedures .Marland KitchenLaceration Repair  Date/Time: 06/20/2021 7:11 PM Performed by: Tonye Pearson, PA-C Authorized by: Tonye Pearson, PA-C   Consent:    Consent obtained:  Verbal   Consent given by:  Patient   Risks discussed:  Infection, need for additional  repair, pain, poor cosmetic result and poor wound healing   Alternatives discussed:  No treatment and delayed treatment Universal protocol:    Procedure explained and questions answered to patient or proxy's satisfaction: yes     Relevant documents present and verified: yes     Test results available: yes     Imaging studies available: yes     Required blood products, implants, devices, and special equipment available: yes     Site/side marked: yes     Immediately prior to procedure, a time out was called: yes     Patient identity confirmed:  Verbally with patient Anesthesia:    Anesthesia method:  Local infiltration Laceration details:    Length (cm):  1.5   Depth (mm):  2 Treatment:    Area cleansed with:  Chlorhexidine   Amount of cleaning:  Extensive   Irrigation solution:  Sterile saline   Irrigation volume:  256ml   Irrigation method:  Pressure wash   Visualized foreign bodies/material removed: no     Debridement:  None Skin repair:    Repair method:  Sutures   Suture size:  5-0   Suture material:  Prolene   Suture technique:  Simple interrupted   Number of sutures:  2 Approximation:    Approximation:  Loose Repair type:    Repair type:  Simple Post-procedure details:    Dressing:  Antibiotic ointment, bulky dressing and non-adherent dressing   Procedure completion:  Tolerated    Medications Ordered in ED Medications  Tdap (BOOSTRIX) injection 0.5 mL (has no administration in time range)  lidocaine (XYLOCAINE) 2 % (with pres) injection 200 mg (has no administration in time range)    ED Course/ Medical Decision Making/ A&P                           Medical Decision Making Risk OTC drugs. Prescription drug management.    History:  Crystal Mcknight is a 46 y.o. female who presents to the ED for evaluation of a dog bite that occurred several hours prior to arrival.  Patient states that one of her friends dogs attacked another dog and when she tried to  separate them a friend's dog bit her hand.  She has a laceration to the right ring finger approximately 1 to 2 cm long.  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  She did not feel pain at the time, but was feeling very uncomfortable right now.  She is up-to-date on her tetanus.  The dog is up-to-date on all vaccines.  She has no other complaints  This patient presents to the ED for concern of dog bite, this involves an extensive number of treatment options, and is a complaint that carries with it a high risk of complications and morbidity.   Concern for tendon rupture, nerve damage, infection risk   Disposition:  After consideration of the diagnostic results, physical exam, history feel that the patent would benefit from discharge with outpatient therapy.   Dog bite Right ring finger laceration:  Pressure irrigation performed. Pt has  no comorbidities to effect normal wound healing. Pt discharged with antibiotics.  She has penicillin allergy so discharged home with clindamycin and doxycycline. Discussed suture home care with patient and answered questions. Pt to follow-up for wound check and suture removal in 7 days; they are to return to the ED sooner for signs of infection. Pt is hemodynamically stable with no complaints prior to dc.    Final Clinical Impression(s) / ED Diagnoses Final diagnoses:  None    Rx / DC Orders ED Discharge Orders     None         Rodena Piety 06/20/21 1911    Lajean Saver, MD 06/20/21 2217

## 2021-06-20 NOTE — ED Triage Notes (Signed)
Patient reports being bit by a friend's dog today on the right hand. She reports the dog is up to date on his shots.

## 2021-06-20 NOTE — Discharge Instructions (Signed)
1. Medications: Tylenol or ibuprofen for pain, usual home medications. Use the two antibiotics for one week. I have sent in a prescription for diflucan for if you develop a yeast infection. 2. Treatment: ice for swelling, keep wound clean with warm soap and water and keep bandage dry, do not submerge in water for 24 hours 3. Follow Up: Please return or follow up with primary care provider in 7 days to have your stitches/staples removed or sooner if you have concerns. Return to the emergency department for increased redness, drainage of pus from the wound, or fevers/chills.   WOUND CARE  Keep area clean and dry for 24 hours. Do not remove bandage, if applied.  After 24 hours, remove bandage and wash wound gently with mild soap and warm water. Reapply a new bandage after cleaning wound, if directed.   Continue daily cleansing with soap and water until stitches/staples are removed.  Do not apply any ointments or creams to the wound while stitches/staples are in place, as this may cause delayed healing. Return if you experience any of the following signs of infection: Swelling, redness, pus drainage, streaking, fever >101.0 F  Return if you experience excessive bleeding that does not stop after 15-20 minutes of constant, firm pressure.

## 2021-07-08 ENCOUNTER — Other Ambulatory Visit: Payer: Self-pay

## 2021-07-08 DIAGNOSIS — E039 Hypothyroidism, unspecified: Secondary | ICD-10-CM

## 2021-07-08 MED ORDER — LEVOTHYROXINE SODIUM 150 MCG PO TABS: 150.0000 ug | ORAL_TABLET | Freq: Every day | ORAL | 3 refills | Status: DC

## 2021-08-05 ENCOUNTER — Other Ambulatory Visit: Payer: Self-pay

## 2021-08-05 ENCOUNTER — Other Ambulatory Visit (INDEPENDENT_AMBULATORY_CARE_PROVIDER_SITE_OTHER): Payer: BC Managed Care – PPO

## 2021-08-05 ENCOUNTER — Encounter: Payer: Self-pay | Admitting: Internal Medicine

## 2021-08-05 DIAGNOSIS — E039 Hypothyroidism, unspecified: Secondary | ICD-10-CM

## 2021-08-05 LAB — TSH: TSH: 4.79 u[IU]/mL (ref 0.35–5.50)

## 2021-08-05 LAB — T4, FREE: Free T4: 0.93 ng/dL (ref 0.60–1.60)

## 2021-08-11 DIAGNOSIS — Z0000 Encounter for general adult medical examination without abnormal findings: Secondary | ICD-10-CM | POA: Diagnosis not present

## 2021-08-12 DIAGNOSIS — Z1231 Encounter for screening mammogram for malignant neoplasm of breast: Secondary | ICD-10-CM | POA: Diagnosis not present

## 2021-08-12 DIAGNOSIS — Z01419 Encounter for gynecological examination (general) (routine) without abnormal findings: Secondary | ICD-10-CM | POA: Diagnosis not present

## 2021-08-12 DIAGNOSIS — Z6821 Body mass index (BMI) 21.0-21.9, adult: Secondary | ICD-10-CM | POA: Diagnosis not present

## 2021-08-16 ENCOUNTER — Encounter: Payer: Self-pay | Admitting: Internal Medicine

## 2021-08-16 DIAGNOSIS — E039 Hypothyroidism, unspecified: Secondary | ICD-10-CM

## 2021-08-17 DIAGNOSIS — Z139 Encounter for screening, unspecified: Secondary | ICD-10-CM | POA: Diagnosis not present

## 2021-08-17 DIAGNOSIS — Z1321 Encounter for screening for nutritional disorder: Secondary | ICD-10-CM | POA: Diagnosis not present

## 2021-08-17 DIAGNOSIS — Z131 Encounter for screening for diabetes mellitus: Secondary | ICD-10-CM | POA: Diagnosis not present

## 2021-08-17 DIAGNOSIS — Z13220 Encounter for screening for lipoid disorders: Secondary | ICD-10-CM | POA: Diagnosis not present

## 2021-08-17 DIAGNOSIS — E039 Hypothyroidism, unspecified: Secondary | ICD-10-CM | POA: Diagnosis not present

## 2021-08-17 MED ORDER — LEVOTHYROXINE SODIUM 150 MCG PO TABS: 150.0000 ug | ORAL_TABLET | ORAL | 1 refills | Status: DC

## 2021-08-17 MED ORDER — LEVOTHYROXINE SODIUM 175 MCG PO TABS: 175.0000 ug | ORAL_TABLET | ORAL | 1 refills | Status: DC

## 2021-09-01 ENCOUNTER — Other Ambulatory Visit: Payer: Self-pay | Admitting: Obstetrics and Gynecology

## 2021-09-01 DIAGNOSIS — Z803 Family history of malignant neoplasm of breast: Secondary | ICD-10-CM

## 2021-09-28 NOTE — Telephone Encounter (Signed)
NA

## 2021-10-22 ENCOUNTER — Encounter: Payer: Self-pay | Admitting: Internal Medicine

## 2021-10-26 ENCOUNTER — Other Ambulatory Visit: Payer: Self-pay | Admitting: Radiology

## 2021-11-11 ENCOUNTER — Ambulatory Visit (AMBULATORY_SURGERY_CENTER): Payer: Self-pay | Admitting: *Deleted

## 2021-11-11 VITALS — Ht 66.0 in | Wt 142.4 lb

## 2021-11-11 DIAGNOSIS — Z1211 Encounter for screening for malignant neoplasm of colon: Secondary | ICD-10-CM

## 2021-11-11 MED ORDER — NA SULFATE-K SULFATE-MG SULF 17.5-3.13-1.6 GM/177ML PO SOLN: 1.0000 | Freq: Once | ORAL | 0 refills | Status: AC

## 2021-11-11 NOTE — Progress Notes (Signed)

## 2021-11-16 ENCOUNTER — Encounter: Payer: Self-pay | Admitting: Internal Medicine

## 2021-11-16 DIAGNOSIS — E039 Hypothyroidism, unspecified: Secondary | ICD-10-CM

## 2021-11-17 DIAGNOSIS — D225 Melanocytic nevi of trunk: Secondary | ICD-10-CM | POA: Diagnosis not present

## 2021-11-18 ENCOUNTER — Other Ambulatory Visit (INDEPENDENT_AMBULATORY_CARE_PROVIDER_SITE_OTHER): Payer: BC Managed Care – PPO

## 2021-11-18 DIAGNOSIS — E039 Hypothyroidism, unspecified: Secondary | ICD-10-CM | POA: Diagnosis not present

## 2021-11-18 LAB — T4, FREE: Free T4: 1.14 ng/dL (ref 0.60–1.60)

## 2021-11-18 LAB — TSH: TSH: 2.01 u[IU]/mL (ref 0.35–5.50)

## 2021-12-04 ENCOUNTER — Encounter: Payer: Self-pay | Admitting: Internal Medicine

## 2021-12-09 ENCOUNTER — Encounter: Payer: Self-pay | Admitting: Internal Medicine

## 2021-12-09 ENCOUNTER — Ambulatory Visit (AMBULATORY_SURGERY_CENTER): Payer: BC Managed Care – PPO | Admitting: Internal Medicine

## 2021-12-09 VITALS — BP 110/69 | HR 59 | Temp 97.3°F | Resp 12 | Ht 66.0 in | Wt 142.4 lb

## 2021-12-09 DIAGNOSIS — K635 Polyp of colon: Secondary | ICD-10-CM

## 2021-12-09 DIAGNOSIS — D125 Benign neoplasm of sigmoid colon: Secondary | ICD-10-CM | POA: Diagnosis not present

## 2021-12-09 DIAGNOSIS — Z1211 Encounter for screening for malignant neoplasm of colon: Secondary | ICD-10-CM | POA: Diagnosis not present

## 2021-12-09 MED ORDER — SODIUM CHLORIDE 0.9 % IV SOLN: 500.0000 mL | Freq: Once | INTRAVENOUS | Status: DC

## 2021-12-09 NOTE — Progress Notes (Signed)
Pt non-responsive, VVS, Report to RN  °

## 2021-12-09 NOTE — Patient Instructions (Signed)
   Handouts on polyps & hemorrhoids given to you today  Await pathology results on polyps removed     YOU HAD AN ENDOSCOPIC PROCEDURE TODAY AT [facility]:   Refer to the procedure report that was given to you for any specific questions about what was found during the examination.  If the procedure report does not answer your questions, please call your gastroenterologist to clarify.  If you requested that your care partner not be given the details of your procedure findings, then the procedure report has been included in a sealed envelope for you to review at your convenience later.  YOU SHOULD EXPECT: Some feelings of bloating in the abdomen. Passage of more gas than usual.  Walking can help get rid of the air that was put into your GI tract during the procedure and reduce the bloating. If you had a lower endoscopy (such as a colonoscopy or flexible sigmoidoscopy) you may notice spotting of blood in your stool or on the toilet paper. If you underwent a bowel prep for your procedure, you may not have a normal bowel movement for a few days.  Please Note:  You might notice some irritation and congestion in your nose or some drainage.  This is from the oxygen used during your procedure.  There is no need for concern and it should clear up in a day or so.  SYMPTOMS TO REPORT IMMEDIATELY:  Following lower endoscopy (colonoscopy or flexible sigmoidoscopy):  Excessive amounts of blood in the stool  Significant tenderness or worsening of abdominal pains  Swelling of the abdomen that is new, acute  Fever of 100F or higher   For urgent or emergent issues, a gastroenterologist can be reached at any hour by calling [phone]. Do not use MyChart messaging for urgent concerns.    DIET:  We do recommend a small meal at first, but then you may proceed to your regular diet.  Drink plenty of fluids but you should avoid alcoholic beverages for 24 hours.  ACTIVITY:  You should plan to  take it easy for the rest of today and you should NOT DRIVE or use heavy machinery until tomorrow (because of the sedation medicines used during the test).    FOLLOW UP: Our staff will call the number listed on your records the next business day following your procedure.  We will call around 7:15- 8:00 am to check on you and address any questions or concerns that you may have regarding the information given to you following your procedure. If we do not reach you, we will leave a message.  If you develop any symptoms (ie: fever, flu-like symptoms, shortness of breath, cough etc.) before then, please call [phone].  If you test positive for Covid 19 in the 2 weeks post procedure, please call and report this information to [place].    If any biopsies were taken you will be contacted by phone or by letter within the next 1-3 weeks.  Please call us at [phone] if you have not heard about the biopsies in 3 weeks.    SIGNATURES/CONFIDENTIALITY: You and/or your care partner have signed paperwork which will be entered into your electronic medical record.  These signatures attest to the fact that that the information above on your After Visit Summary has been reviewed and is understood.  Full responsibility of the confidentiality of this discharge information lies with you and/or your care-partner.

## 2021-12-09 NOTE — Progress Notes (Signed)
GASTROENTEROLOGY PROCEDURE H&P NOTE   Primary Care Physician: Velna Hatchet, MD    Reason for Procedure:   Colon cancer screening  Plan:    Colonoscopy  Patient is appropriate for endoscopic procedure(s) in the ambulatory ([facility]) setting.  The nature of the procedure, as well as the risks, benefits, and alternatives were carefully and thoroughly reviewed with the patient. Ample time for discussion and questions allowed. The patient understood, was satisfied, and agreed to proceed.     HPI: Crystal Mcknight is a 46 y.o. female who presents for colonoscopy for colon cancer screening. Denies blood in stools, changes in bowel habits, weight loss. Denies family history of colon cancer.  Past Medical History:  Diagnosis Date   Arthritis    hands,pt denies 11/11/21   Depression    Hypothyroidism    Pelvic pain    PONV (postoperative nausea and vomiting)    Substance abuse ([place])    ETOH,RECOVERY FOR 4 YEARS UPDATED 11/11/21   SVD (spontaneous vaginal delivery)    x 2   Thyroid disease     Past Surgical History:  Procedure Laterality Date   APPENDECTOMY     LAPAROSCOPY N/A 05/31/2013   Procedure: LAPAROSCOPY OPERATIVE, FULGURATION OF ENDOMETRIOSIS, REMOVAL OF BILATERAL FALLOPIAN TUBE CYST;  Surgeon: Marylynn Pearson, MD;  Location: [facility];  Service: Gynecology;  Laterality: N/A;   RHINOPLASTY     WISDOM TOOTH EXTRACTION      Prior to Admission medications   Medication Sig Start Date End Date Taking? Authorizing Provider  B Complex-C (B-COMPLEX WITH VITAMIN C) tablet B Complex   Yes [provider]  levothyroxine (SYNTHROID) 150 MCG tablet Take 1 tablet (150 mcg total) by mouth every other day. 08/17/21  Yes Philemon Kingdom, MD  levothyroxine (SYNTHROID) 175 MCG tablet Take 1 tablet (175 mcg total) by mouth every other day. 08/17/21  Yes Philemon Kingdom, MD  Multiple Vitamins-Minerals (MULTIVITAMIN ADULT EXTRA C PO) daily.   Yes [provider]  sertraline  (ZOLOFT) 50 MG tablet Take 50 mg by mouth daily.   Yes [provider]  traZODone (DESYREL) 50 MG tablet Take at bedtime as directed 08/02/18  Yes Dara Hoyer, PA-C  fluticasone [facility]) 50 MCG/ACT nasal spray Place into both nostrils daily.    [provider]    Current Outpatient Medications  Medication Sig Dispense Refill   B Complex-C (B-COMPLEX WITH VITAMIN C) tablet B Complex     levothyroxine (SYNTHROID) 150 MCG tablet Take 1 tablet (150 mcg total) by mouth every other day. 45 tablet 1   levothyroxine (SYNTHROID) 175 MCG tablet Take 1 tablet (175 mcg total) by mouth every other day. 45 tablet 1   Multiple Vitamins-Minerals (MULTIVITAMIN ADULT EXTRA C PO) daily.     sertraline (ZOLOFT) 50 MG tablet Take 50 mg by mouth daily.     traZODone (DESYREL) 50 MG tablet Take at bedtime as directed 90 tablet 1   fluticasone (FLONASE) 50 MCG/ACT nasal spray Place into both nostrils daily.     Current Facility-Administered Medications  Medication Dose Route Frequency Provider Last Rate Last Admin   0.9 %  sodium chloride infusion  500 mL Intravenous Once Sharyn Creamer, MD        Allergies as of 12/09/2021 - Review Complete 12/09/2021  Allergen Reaction Noted   Penicillins Rash 10/04/2011    Family History  Problem Relation Age of Onset   Breast cancer Mother    Colon polyps Mother    Cancer Mother  Hyperlipidemia Mother    Colon polyps Father    Hyperlipidemia Father    Hypertension Father    Diverticulosis Father    Cancer Maternal Grandmother    Heart disease Maternal Grandfather    Stomach cancer Paternal Grandmother    Cancer Paternal Grandmother    Heart disease Paternal Grandfather    Colon cancer Neg Hx    Crohn's disease Neg Hx    Esophageal cancer Neg Hx    Rectal cancer Neg Hx     Social History   Socioeconomic History   Marital status: Married    Spouse name: Not on file   Number of children: Not on file   Years of education: Not on  file   Highest education level: Not on file  Occupational History   Not on file  Tobacco Use   Smoking status: Never    Passive exposure: Current   Smokeless tobacco: Never  Vaping Use   Vaping Use: Never used  Substance and Sexual Activity   Alcohol use: Not Currently    Alcohol/week: 7.0 - 14.0 standard drinks of alcohol    Types: 7 - 14 Glasses of wine per week   Drug use: No   Sexual activity: Yes    Birth control/protection: None    Comment: husband vasectomy  Other Topics Concern   Not on file  Social History Narrative   Not on file   Social Determinants of Health   Financial Resource Strain: Not on file  Food Insecurity: Not on file  Transportation Needs: Not on file  Physical Activity: Not on file  Stress: Not on file  Social Connections: Not on file  Intimate Partner Violence: Not on file    Physical Exam: Vital signs in last 24 hours: BP (!) 86/45   Pulse 82   Temp (!) 97.3 F (36.3 C)   Ht '5\' 6"'$  (1.676 m)   Wt 142 lb 6.4 oz (64.6 kg)   LMP 11/21/2021 (Approximate)   SpO2 99%   BMI 22.98 kg/m  GEN: NAD EYE: Sclerae anicteric ENT: MMM CV: Non-tachycardic Pulm: No increased work of breathing GI: Soft, NT/ND NEURO:  Alert & Oriented   Christia Reading, MD [facility]  12/09/2021 8:36 AM

## 2021-12-09 NOTE — Progress Notes (Signed)
Pt's states no medical or surgical changes since previsit or office visit. 

## 2021-12-09 NOTE — Progress Notes (Signed)
Called to room to assist during endoscopic procedure.  Patient ID and intended procedure confirmed with present staff. Received instructions for my participation in the procedure from the performing physician.  

## 2021-12-09 NOTE — Op Note (Signed)
Opp Patient Name: Peityn Payton Procedure Date: 12/09/2021 8:45 AM MRN: [id number] Endoscopist: Sonny Masters "Christia Reading ,  Age: 46 Referring MD:  Date of Birth: March 01, 1976 Gender: Female Account #: [id number] Procedure:                Colonoscopy Indications:              Screening for colorectal malignant neoplasm, This                            is the patient's first colonoscopy Medicines:                Monitored Anesthesia Care Procedure:                Pre-Anesthesia Assessment:                           - Prior to the procedure, a History and Physical                            was performed, and patient medications and                            allergies were reviewed. The patient's tolerance of                            previous anesthesia was also reviewed. The risks                            and benefits of the procedure and the sedation                            options and risks were discussed with the patient.                            All questions were answered, and informed consent                            was obtained. Prior Anticoagulants: The patient has                            taken no previous anticoagulant or antiplatelet                            agents. ASA Grade Assessment: II - A patient with                            mild systemic disease. After reviewing the risks                            and benefits, the patient was deemed in                            satisfactory condition to undergo the procedure.  After obtaining informed consent, the colonoscope                            was passed under direct vision. Throughout the                            procedure, the patient's blood pressure, pulse, and                            oxygen saturations were monitored continuously. The                            Olympus PCF-H190DL ([id number]) Colonoscope was                            introduced through  the anus and advanced to the the                            terminal ileum. The quality of the bowel                            preparation was good. The terminal ileum, ileocecal                            valve, appendiceal orifice, and rectum were                            photographed. Scope In: 8:50:08 AM Scope Out: 9:21:24 AM Scope Withdrawal Time: 0 hours 26 minutes 23 seconds  Total Procedure Duration: 0 hours 31 minutes 16 seconds  Findings:                 The terminal ileum appeared normal.                           Four sessile polyps were found in the sigmoid                            colon. The polyps were 3 to 11 mm in size. These                            polyps were removed with a cold snare. Resection                            and retrieval were complete.                           Non-bleeding internal hemorrhoids were found during                            retroflexion. Complications:            No immediate complications. Estimated Blood Loss:     Estimated blood loss was minimal. Impression:               - The examined portion of  the ileum was normal.                           - Four 3 to 11 mm polyps in the sigmoid colon,                            removed with a cold snare. Resected and retrieved.                           - Non-bleeding internal hemorrhoids. Recommendation:           - Discharge patient to home (with escort).                           - Await pathology results.                           - The findings and recommendations were discussed                            with the patient. Sonny Masters "Christia Reading,  12/09/2021 9:24:31 AM

## 2021-12-10 ENCOUNTER — Telehealth: Payer: Self-pay | Admitting: *Deleted

## 2021-12-10 NOTE — Telephone Encounter (Signed)
Follow-up call, left message to call with any questions or concerns.

## 2021-12-11 ENCOUNTER — Encounter: Payer: Self-pay | Admitting: Internal Medicine

## 2022-01-06 DIAGNOSIS — N760 Acute vaginitis: Secondary | ICD-10-CM | POA: Diagnosis not present

## 2022-01-06 DIAGNOSIS — R300 Dysuria: Secondary | ICD-10-CM | POA: Diagnosis not present

## 2022-01-06 DIAGNOSIS — Z113 Encounter for screening for infections with a predominantly sexual mode of transmission: Secondary | ICD-10-CM | POA: Diagnosis not present

## 2022-01-06 DIAGNOSIS — R102 Pelvic and perineal pain: Secondary | ICD-10-CM | POA: Diagnosis not present

## 2022-01-19 ENCOUNTER — Encounter: Payer: Self-pay | Admitting: Internal Medicine

## 2022-01-19 DIAGNOSIS — E039 Hypothyroidism, unspecified: Secondary | ICD-10-CM

## 2022-01-20 MED ORDER — LEVOTHYROXINE SODIUM 150 MCG PO TABS: 150.0000 ug | ORAL_TABLET | ORAL | 1 refills | Status: DC

## 2022-01-20 MED ORDER — LEVOTHYROXINE SODIUM 175 MCG PO TABS: 175.0000 ug | ORAL_TABLET | ORAL | 1 refills | Status: DC

## 2022-01-21 DIAGNOSIS — N946 Dysmenorrhea, unspecified: Secondary | ICD-10-CM | POA: Diagnosis not present

## 2022-02-11 ENCOUNTER — Other Ambulatory Visit: Payer: Self-pay | Admitting: Internal Medicine

## 2022-02-11 DIAGNOSIS — E039 Hypothyroidism, unspecified: Secondary | ICD-10-CM

## 2022-02-16 ENCOUNTER — Ambulatory Visit
Admission: RE | Admit: 2022-02-16 | Discharge: 2022-02-16 | Disposition: A | Discharge status: Home / Self Care | Payer: BC Managed Care – PPO | Source: Ambulatory Visit | Attending: Obstetrics and Gynecology | Admitting: Obstetrics and Gynecology

## 2022-02-16 DIAGNOSIS — N6321 Unspecified lump in the left breast, upper outer quadrant: Secondary | ICD-10-CM | POA: Diagnosis not present

## 2022-02-16 DIAGNOSIS — Z803 Family history of malignant neoplasm of breast: Secondary | ICD-10-CM

## 2022-02-16 MED ORDER — GADOBUTROL 1 MMOL/ML IV SOLN
7.0000 mL | Freq: Once | INTRAVENOUS | Status: AC | PRN
  Administered 2022-02-16: 7 mL via INTRAVENOUS

## 2022-02-17 ENCOUNTER — Other Ambulatory Visit: Payer: Self-pay | Admitting: Obstetrics and Gynecology

## 2022-02-17 DIAGNOSIS — R9389 Abnormal findings on diagnostic imaging of other specified body structures: Secondary | ICD-10-CM

## 2022-02-22 ENCOUNTER — Ambulatory Visit
Admission: RE | Admit: 2022-02-22 | Discharge: 2022-02-22 | Disposition: A | Discharge status: Home / Self Care | Payer: BC Managed Care – PPO | Source: Ambulatory Visit | Attending: Obstetrics and Gynecology | Admitting: Obstetrics and Gynecology

## 2022-02-22 DIAGNOSIS — R9389 Abnormal findings on diagnostic imaging of other specified body structures: Secondary | ICD-10-CM

## 2022-02-22 DIAGNOSIS — N62 Hypertrophy of breast: Secondary | ICD-10-CM | POA: Diagnosis not present

## 2022-02-25 ENCOUNTER — Ambulatory Visit: Payer: BC Managed Care – PPO

## 2022-02-25 ENCOUNTER — Other Ambulatory Visit: Payer: Self-pay | Admitting: Obstetrics and Gynecology

## 2022-02-25 ENCOUNTER — Ambulatory Visit
Admission: RE | Admit: 2022-02-25 | Discharge: 2022-02-25 | Disposition: A | Discharge status: Home / Self Care | Payer: BC Managed Care – PPO | Source: Ambulatory Visit | Attending: Obstetrics and Gynecology | Admitting: Obstetrics and Gynecology

## 2022-02-25 DIAGNOSIS — R9389 Abnormal findings on diagnostic imaging of other specified body structures: Secondary | ICD-10-CM

## 2022-02-25 DIAGNOSIS — N6489 Other specified disorders of breast: Secondary | ICD-10-CM | POA: Diagnosis not present

## 2022-02-25 DIAGNOSIS — N6081 Other benign mammary dysplasias of right breast: Secondary | ICD-10-CM | POA: Diagnosis not present

## 2022-02-25 DIAGNOSIS — R928 Other abnormal and inconclusive findings on diagnostic imaging of breast: Secondary | ICD-10-CM | POA: Diagnosis not present

## 2022-02-25 MED ORDER — GADOBUTROL 1 MMOL/ML IV SOLN
7.0000 mL | Freq: Once | INTRAVENOUS | Status: AC | PRN
  Administered 2022-02-25: 7 mL via INTRAVENOUS

## 2022-03-02 ENCOUNTER — Other Ambulatory Visit: Payer: Self-pay | Admitting: Surgery

## 2022-03-02 DIAGNOSIS — R102 Pelvic and perineal pain: Secondary | ICD-10-CM | POA: Diagnosis not present

## 2022-03-02 DIAGNOSIS — N6082 Other benign mammary dysplasias of left breast: Secondary | ICD-10-CM | POA: Diagnosis not present

## 2022-03-03 ENCOUNTER — Other Ambulatory Visit: Payer: Self-pay | Admitting: Surgery

## 2022-03-03 DIAGNOSIS — N6082 Other benign mammary dysplasias of left breast: Secondary | ICD-10-CM

## 2022-03-11 DIAGNOSIS — Z1151 Encounter for screening for human papillomavirus (HPV): Secondary | ICD-10-CM | POA: Diagnosis not present

## 2022-03-11 DIAGNOSIS — R102 Pelvic and perineal pain: Secondary | ICD-10-CM | POA: Diagnosis not present

## 2022-03-11 DIAGNOSIS — Z124 Encounter for screening for malignant neoplasm of cervix: Secondary | ICD-10-CM | POA: Diagnosis not present

## 2022-03-17 ENCOUNTER — Other Ambulatory Visit: Payer: Self-pay

## 2022-03-17 ENCOUNTER — Encounter (HOSPITAL_BASED_OUTPATIENT_CLINIC_OR_DEPARTMENT_OTHER): Payer: Self-pay | Admitting: Surgery

## 2022-03-24 ENCOUNTER — Ambulatory Visit
Admission: RE | Admit: 2022-03-24 | Discharge: 2022-03-24 | Disposition: A | Discharge status: Home / Self Care | Payer: BC Managed Care – PPO | Source: Ambulatory Visit | Attending: Surgery | Admitting: Surgery

## 2022-03-24 DIAGNOSIS — N6082 Other benign mammary dysplasias of left breast: Secondary | ICD-10-CM

## 2022-03-24 DIAGNOSIS — R928 Other abnormal and inconclusive findings on diagnostic imaging of breast: Secondary | ICD-10-CM | POA: Diagnosis not present

## 2022-03-24 MED ORDER — CHLORHEXIDINE GLUCONATE CLOTH 2 % EX PADS: 6.0000 | MEDICATED_PAD | Freq: Once | CUTANEOUS | Status: DC

## 2022-03-24 MED ORDER — ENSURE PRE-SURGERY PO LIQD: 296.0000 mL | Freq: Once | ORAL | Status: DC

## 2022-03-24 NOTE — Anesthesia Preprocedure Evaluation (Signed)
Anesthesia Evaluation  Patient identified by MRN, date of birth, ID band Patient awake    Reviewed: Allergy & Precautions, NPO status , Patient's Chart, lab work & pertinent test results  History of Anesthesia Complications (+) PONV and history of anesthetic complications  Airway Mallampati: II  TM Distance: >3 FB Neck ROM: Full    Dental no notable dental hx. (+) Dental Advisory Given   Pulmonary neg pulmonary ROS   Pulmonary exam normal        Cardiovascular negative cardio ROS Normal cardiovascular exam     Neuro/Psych  PSYCHIATRIC DISORDERS Anxiety Depression    negative neurological ROS     GI/Hepatic negative GI ROS, Neg liver ROS,,,  Endo/Other  Hypothyroidism    Renal/GU negative Renal ROS     Musculoskeletal negative musculoskeletal ROS (+)    Abdominal   Peds  Hematology negative hematology ROS (+)   Anesthesia Other Findings   Reproductive/Obstetrics                             Anesthesia Physical Anesthesia Plan  ASA: 2  Anesthesia Plan: General   Post-op Pain Management: Celebrex PO (pre-op)* and Tylenol PO (pre-op)*   Induction: Intravenous  PONV Risk Score and Plan: 4 or greater and Ondansetron, Dexamethasone, TIVA, Midazolam and Scopolamine patch - Pre-op  Airway Management Planned: LMA  Additional Equipment:   Intra-op Plan:   Post-operative Plan: Extubation in OR  Informed Consent: I have reviewed the patients History and Physical, chart, labs and discussed the procedure including the risks, benefits and alternatives for the proposed anesthesia with the patient or authorized representative who has indicated his/her understanding and acceptance.     Dental advisory given  Plan Discussed with: Anesthesiologist, CRNA and Surgeon  Anesthesia Plan Comments:         Anesthesia Quick Evaluation

## 2022-03-24 NOTE — H&P (Signed)
REFERRING PHYSICIAN: Jake Samples, MD  PROVIDER: Beverlee Nims, MD  MRN: [id number] DOB: 11/13/1975 DATE OF ENCOUNTER: 03/02/2022 Subjective   Chief Complaint: New Consultation (Left Flat Epithelial Atypia - Atypical Ductal Hyperplasia )   History of Present Illness: Crystal Mcknight is a 46 y.o. female who is seen today as an office consultation for evaluation of New Consultation (Left Flat Epithelial Atypia - Atypical Ductal Hyperplasia ) .   This is a 46 year old female with a strong family history of breast cancer including her maternal grandmother and mother who recently underwent breast MRI for high rescreening. This showed at least 3 areas of non-mass-like enhancement with 2 in the right breast and 1 in the left. There was also a 4 mm mass in the upper left breast. She underwent 4 biopsies. The 2 biopsies in the right breast and 1 in the left breast were benign. The other biopsy of the left breast showed atypia with a question of developing atypical ductal hyperplasia. Radiology recommended removal of this area. He has had no previous biopsies on her breast. She denies nipple discharge. She is otherwise without complaints.  Review of Systems: A complete review of systems was obtained from the patient. I have reviewed this information and discussed as appropriate with the patient. See HPI as well for other ROS.  ROS   Medical History: Past Medical History:  Diagnosis Date  Arthritis  Thyroid disease   Patient Active Problem List  Diagnosis  Clinodactyly  Hypothyroidism  Osteoarthrosis, hand  PTSD (post-traumatic stress disorder)   History reviewed. No pertinent surgical history.   Allergies  Allergen Reactions  Penicillins Rash  When she was 46years old, had a rash when took this for strep throat   Current Outpatient Medications on File Prior to Visit  Medication Sig Dispense Refill  levothyroxine (SYNTHROID) 150 MCG tablet Take by mouth   multivitamin with B complex-vitamin C (FARBEE WITH C) tablet B Complex  multivitamin with iron-minerals (SUPER THERA VITE M) tablet Take 1 tablet by mouth  sertraline (ZOLOFT) 50 MG tablet Take 50 mg by mouth once daily  sodium, potassium, and magnesium (SUPREP) oral solution TAKE 1 KIT BY MOUTH FOR 1 DOSE  traZODone (DESYREL) 50 MG tablet   No current facility-administered medications on file prior to visit.   History reviewed. No pertinent family history.   Social History   Tobacco Use  Smoking Status Never  Smokeless Tobacco Never    Social History   Socioeconomic History  Marital status: Married  Tobacco Use  Smoking status: Never  Smokeless tobacco: Never  Substance and Sexual Activity  Alcohol use: Not Currently  Drug use: Never   Objective:   Vitals:  03/02/22 1154  BP: 110/60  Pulse: 103  Temp: 36.2 C (97.2 F)  SpO2: 98%  Weight: 63.6 kg (140 lb 3.2 oz)  Height: 170.2 cm (_0 )   Body mass index is 21.96 kg/m.  Physical Exam   She appears well on exam  There is ecchymosis in both her breasts from the biopsies. There are no underlying hematomas. There are no palpable breast masses. The nipple areolar complexes are normal  There is no axillary adenopathy  Labs, Imaging and Diagnostic Testing: I have reviewed her MRI, mammograms, and pathology results  Assessment and Plan:   Diagnoses and all orders for this visit:  Flat epithelial atypia (FEA) of left breast    At this point had a long discussion with the patient regarding the pathology of concern  regarding the left breast atypia. A radioactive seed guided left breast lumpectomy is recommended to remove this area. Given all the areas of non-mass-like enhancement she has considered mastectomies. I believe we should at least move this area first to make sure there is not an underlying malignancy which would then prompt further surgery including lymph node biopsy versus ADH which would put her at  high risk as well. We could then decide with this information whether or not to consider bilateral mastectomies with or without reconstruction. I explained the procedure of a radioactive seed guided left breast lumpectomy with her. We discussed the risks which includes but is not limited to bleeding, infection, the need for further procedures if malignancy is found, injury to surrounding structures, cardiopulmonary issues, postoperative recovery, etc. she understands and agrees to proceed with surgery which will be scheduled

## 2022-03-24 NOTE — Progress Notes (Signed)
       Patient Instructions  The night before surgery:  No food after midnight. ONLY clear liquids after midnight  The day of surgery (if you do NOT have diabetes):  Drink ONE (1) Pre-Surgery Clear Ensure as directed.   This drink was given to you during your hospital  pre-op appointment visit. The pre-op nurse will instruct you on the time to drink the  Pre-Surgery Ensure depending on your surgery time. Finish the drink at the designated time by the pre-op nurse.  Nothing else to drink after completing the  Pre-Surgery Clear Ensure.  The day of surgery (if you have diabetes): Drink ONE (1) Gatorade 2 (G2) as directed. This drink was given to you during your hospital  pre-op appointment visit.  The pre-op nurse will instruct you on the time to drink the   Gatorade 2 (G2) depending on your surgery time. Color of the Gatorade may vary. Red is not allowed. Nothing else to drink after completing the  Gatorade 2 (G2).         If you have questions, please contact your surgeon's office. Surgical soap given to patient with instructions and patient verbalized understanding.

## 2022-03-25 ENCOUNTER — Other Ambulatory Visit: Payer: Self-pay

## 2022-03-25 ENCOUNTER — Ambulatory Visit (HOSPITAL_BASED_OUTPATIENT_CLINIC_OR_DEPARTMENT_OTHER): Payer: BC Managed Care – PPO | Admitting: Anesthesiology

## 2022-03-25 ENCOUNTER — Ambulatory Visit (HOSPITAL_BASED_OUTPATIENT_CLINIC_OR_DEPARTMENT_OTHER)
Admission: RE | Admit: 2022-03-25 | Discharge: 2022-03-25 | Disposition: A | Discharge status: Home / Self Care | Payer: BC Managed Care – PPO | Attending: Surgery | Admitting: Surgery

## 2022-03-25 ENCOUNTER — Encounter (HOSPITAL_BASED_OUTPATIENT_CLINIC_OR_DEPARTMENT_OTHER): Payer: Self-pay | Admitting: Surgery

## 2022-03-25 ENCOUNTER — Encounter (HOSPITAL_BASED_OUTPATIENT_CLINIC_OR_DEPARTMENT_OTHER)
Admission: RE | Disposition: A | Discharge status: Home / Self Care | Payer: Self-pay | Source: Home / Self Care | Attending: Surgery

## 2022-03-25 ENCOUNTER — Ambulatory Visit
Admission: RE | Admit: 2022-03-25 | Discharge: 2022-03-25 | Disposition: A | Discharge status: Home / Self Care | Payer: BC Managed Care – PPO | Source: Ambulatory Visit | Attending: Surgery | Admitting: Surgery

## 2022-03-25 DIAGNOSIS — N6082 Other benign mammary dysplasias of left breast: Secondary | ICD-10-CM

## 2022-03-25 DIAGNOSIS — E039 Hypothyroidism, unspecified: Secondary | ICD-10-CM | POA: Insufficient documentation

## 2022-03-25 DIAGNOSIS — Z803 Family history of malignant neoplasm of breast: Secondary | ICD-10-CM | POA: Diagnosis not present

## 2022-03-25 DIAGNOSIS — R928 Other abnormal and inconclusive findings on diagnostic imaging of breast: Secondary | ICD-10-CM | POA: Diagnosis not present

## 2022-03-25 DIAGNOSIS — N6022 Fibroadenosis of left breast: Secondary | ICD-10-CM | POA: Diagnosis not present

## 2022-03-25 DIAGNOSIS — N6489 Other specified disorders of breast: Secondary | ICD-10-CM | POA: Diagnosis not present

## 2022-03-25 DIAGNOSIS — N6092 Unspecified benign mammary dysplasia of left breast: Secondary | ICD-10-CM | POA: Diagnosis not present

## 2022-03-25 DIAGNOSIS — Z01818 Encounter for other preprocedural examination: Secondary | ICD-10-CM

## 2022-03-25 HISTORY — PX: BREAST LUMPECTOMY WITH RADIOACTIVE SEED LOCALIZATION: SHX6424

## 2022-03-25 LAB — POCT PREGNANCY, URINE: Preg Test, Ur: NEGATIVE

## 2022-03-25 SURGERY — BREAST LUMPECTOMY WITH RADIOACTIVE SEED LOCALIZATION
Anesthesia: General | Site: Breast | Laterality: Left

## 2022-03-25 MED ORDER — DEXAMETHASONE SODIUM PHOSPHATE 10 MG/ML IJ SOLN
INTRAMUSCULAR | Status: AC
  Filled 2022-03-25: qty 1

## 2022-03-25 MED ORDER — FENTANYL CITRATE (PF) 100 MCG/2ML IJ SOLN
25.0000 ug | INTRAMUSCULAR | Status: DC | PRN
  Administered 2022-03-25: 50 ug via INTRAVENOUS

## 2022-03-25 MED ORDER — CIPROFLOXACIN IN D5W 400 MG/200ML IV SOLN
INTRAVENOUS | Status: AC
  Filled 2022-03-25: qty 200

## 2022-03-25 MED ORDER — TRAMADOL HCL 50 MG PO TABS: 50.0000 mg | ORAL_TABLET | Freq: Four times a day (QID) | ORAL | 0 refills | Status: DC | PRN

## 2022-03-25 MED ORDER — FENTANYL CITRATE (PF) 100 MCG/2ML IJ SOLN
INTRAMUSCULAR | Status: DC | PRN
  Administered 2022-03-25: 100 ug via INTRAVENOUS

## 2022-03-25 MED ORDER — FENTANYL CITRATE (PF) 100 MCG/2ML IJ SOLN
INTRAMUSCULAR | Status: AC
  Filled 2022-03-25: qty 2

## 2022-03-25 MED ORDER — CELECOXIB 200 MG PO CAPS
200.0000 mg | ORAL_CAPSULE | Freq: Once | ORAL | Status: AC
  Administered 2022-03-25: 200 mg via ORAL

## 2022-03-25 MED ORDER — ACETAMINOPHEN 500 MG PO TABS
ORAL_TABLET | ORAL | Status: AC
  Filled 2022-03-25: qty 2

## 2022-03-25 MED ORDER — PHENYLEPHRINE 80 MCG/ML (10ML) SYRINGE FOR IV PUSH (FOR BLOOD PRESSURE SUPPORT)
PREFILLED_SYRINGE | INTRAVENOUS | Status: AC
  Filled 2022-03-25: qty 10

## 2022-03-25 MED ORDER — PROPOFOL 10 MG/ML IV BOLUS
INTRAVENOUS | Status: DC | PRN
  Administered 2022-03-25: 150 mg via INTRAVENOUS

## 2022-03-25 MED ORDER — PROMETHAZINE HCL 25 MG/ML IJ SOLN: 6.2500 mg | INTRAMUSCULAR | Status: DC | PRN

## 2022-03-25 MED ORDER — LIDOCAINE HCL (CARDIAC) PF 100 MG/5ML IV SOSY
PREFILLED_SYRINGE | INTRAVENOUS | Status: DC | PRN
  Administered 2022-03-25: 60 mg via INTRAVENOUS

## 2022-03-25 MED ORDER — AMISULPRIDE (ANTIEMETIC) 5 MG/2ML IV SOLN: 10.0000 mg | Freq: Once | INTRAVENOUS | Status: DC | PRN

## 2022-03-25 MED ORDER — MIDAZOLAM HCL 5 MG/5ML IJ SOLN
INTRAMUSCULAR | Status: DC | PRN
  Administered 2022-03-25: 2 mg via INTRAVENOUS

## 2022-03-25 MED ORDER — ONDANSETRON HCL 4 MG/2ML IJ SOLN
INTRAMUSCULAR | Status: AC
  Filled 2022-03-25: qty 2

## 2022-03-25 MED ORDER — CELECOXIB 200 MG PO CAPS
ORAL_CAPSULE | ORAL | Status: AC
  Filled 2022-03-25: qty 1

## 2022-03-25 MED ORDER — SCOPOLAMINE 1 MG/3DAYS TD PT72
1.0000 | MEDICATED_PATCH | TRANSDERMAL | Status: DC
  Administered 2022-03-25: 1.5 mg via TRANSDERMAL

## 2022-03-25 MED ORDER — LACTATED RINGERS IV SOLN: INTRAVENOUS | Status: DC

## 2022-03-25 MED ORDER — DEXAMETHASONE SODIUM PHOSPHATE 4 MG/ML IJ SOLN
INTRAMUSCULAR | Status: DC | PRN
  Administered 2022-03-25: 5 mg via INTRAVENOUS

## 2022-03-25 MED ORDER — EPHEDRINE 5 MG/ML INJ
INTRAVENOUS | Status: AC
  Filled 2022-03-25: qty 5

## 2022-03-25 MED ORDER — CIPROFLOXACIN IN D5W 400 MG/200ML IV SOLN
400.0000 mg | INTRAVENOUS | Status: AC
  Administered 2022-03-25: 400 mg via INTRAVENOUS

## 2022-03-25 MED ORDER — SUCCINYLCHOLINE CHLORIDE 200 MG/10ML IV SOSY
PREFILLED_SYRINGE | INTRAVENOUS | Status: AC
  Filled 2022-03-25: qty 10

## 2022-03-25 MED ORDER — ONDANSETRON HCL 4 MG/2ML IJ SOLN
INTRAMUSCULAR | Status: DC | PRN
  Administered 2022-03-25: 4 mg via INTRAVENOUS

## 2022-03-25 MED ORDER — SCOPOLAMINE 1 MG/3DAYS TD PT72
MEDICATED_PATCH | TRANSDERMAL | Status: AC
  Filled 2022-03-25: qty 1

## 2022-03-25 MED ORDER — LIDOCAINE 2% (20 MG/ML) 5 ML SYRINGE
INTRAMUSCULAR | Status: AC
  Filled 2022-03-25: qty 5

## 2022-03-25 MED ORDER — ACETAMINOPHEN 500 MG PO TABS
1000.0000 mg | ORAL_TABLET | ORAL | Status: AC
  Administered 2022-03-25: 1000 mg via ORAL

## 2022-03-25 MED ORDER — MIDAZOLAM HCL 2 MG/2ML IJ SOLN
INTRAMUSCULAR | Status: AC
  Filled 2022-03-25: qty 2

## 2022-03-25 MED ORDER — BUPIVACAINE-EPINEPHRINE 0.5% -1:200000 IJ SOLN
INTRAMUSCULAR | Status: DC | PRN
  Administered 2022-03-25: 9 mL

## 2022-03-25 MED ORDER — ACETAMINOPHEN 500 MG PO TABS: 1000.0000 mg | ORAL_TABLET | Freq: Once | ORAL | Status: AC

## 2022-03-25 SURGICAL SUPPLY — 44 items
ADH SKN CLS APL DERMABOND .7 (GAUZE/BANDAGES/DRESSINGS) ×1
APL PRP STRL LF DISP 70% ISPRP (MISCELLANEOUS) ×1
APPLIER CLIP 9.375 MED OPEN (MISCELLANEOUS)
APR CLP MED 9.3 20 MLT OPN (MISCELLANEOUS)
BINDER BREAST LRG (GAUZE/BANDAGES/DRESSINGS) IMPLANT
BINDER BREAST MEDIUM (GAUZE/BANDAGES/DRESSINGS) IMPLANT
BLADE SURG 15 STRL LF DISP TIS (BLADE) ×1 IMPLANT
BLADE SURG 15 STRL SS (BLADE) ×1
CANISTER SUC SOCK COL 7IN (MISCELLANEOUS) IMPLANT
CANISTER SUCT 1200ML W/VALVE (MISCELLANEOUS) IMPLANT
CHLORAPREP W/TINT 26 (MISCELLANEOUS) ×1 IMPLANT
CLIP APPLIE 9.375 MED OPEN (MISCELLANEOUS) IMPLANT
COVER BACK TABLE 60X90IN (DRAPES) ×1 IMPLANT
COVER MAYO STAND STRL (DRAPES) ×1 IMPLANT
COVER PROBE W GEL 5X96 (DRAPES) ×1 IMPLANT
DERMABOND ADVANCED .7 DNX12 (GAUZE/BANDAGES/DRESSINGS) ×1 IMPLANT
DRAPE LAPAROSCOPIC ABDOMINAL (DRAPES) ×1 IMPLANT
DRAPE UTILITY XL STRL (DRAPES) ×1 IMPLANT
ELECT REM PT RETURN 9FT ADLT (ELECTROSURGICAL) ×1
ELECTRODE REM PT RTRN 9FT ADLT (ELECTROSURGICAL) ×1 IMPLANT
GAUZE SPONGE 4X4 12PLY STRL LF (GAUZE/BANDAGES/DRESSINGS) IMPLANT
GLOVE SURG SIGNA 7.5 PF LTX (GLOVE) ×1 IMPLANT
GOWN STRL REUS W/ TWL LRG LVL3 (GOWN DISPOSABLE) ×1 IMPLANT
GOWN STRL REUS W/ TWL XL LVL3 (GOWN DISPOSABLE) ×1 IMPLANT
GOWN STRL REUS W/TWL LRG LVL3 (GOWN DISPOSABLE) ×1
GOWN STRL REUS W/TWL XL LVL3 (GOWN DISPOSABLE) ×1
KIT MARKER MARGIN INK (KITS) ×1 IMPLANT
NDL HYPO 25X1 1.5 SAFETY (NEEDLE) ×1 IMPLANT
NEEDLE HYPO 25X1 1.5 SAFETY (NEEDLE) ×1 IMPLANT
NS IRRIG 1000ML POUR BTL (IV SOLUTION) IMPLANT
PACK BASIN DAY SURGERY FS (CUSTOM PROCEDURE TRAY) ×1 IMPLANT
PENCIL SMOKE EVACUATOR (MISCELLANEOUS) ×1 IMPLANT
SLEEVE SCD COMPRESS KNEE MED (STOCKING) ×1 IMPLANT
SPIKE FLUID TRANSFER (MISCELLANEOUS) IMPLANT
SPONGE T-LAP 4X18 ~~LOC~~+RFID (SPONGE) ×1 IMPLANT
SUT MNCRL AB 4-0 PS2 18 (SUTURE) ×1 IMPLANT
SUT SILK 2 0 SH (SUTURE) IMPLANT
SUT VIC AB 3-0 SH 27 (SUTURE) ×1
SUT VIC AB 3-0 SH 27X BRD (SUTURE) ×1 IMPLANT
SYR CONTROL 10ML LL (SYRINGE) ×1 IMPLANT
TOWEL GREEN STERILE FF (TOWEL DISPOSABLE) ×1 IMPLANT
TRAY FAXITRON CT DISP (TRAY / TRAY PROCEDURE) ×1 IMPLANT
TUBE CONNECTING 20X1/4 (TUBING) IMPLANT
YANKAUER SUCT BULB TIP NO VENT (SUCTIONS) IMPLANT

## 2022-03-25 NOTE — Anesthesia Postprocedure Evaluation (Signed)
Anesthesia Post Note  Patient: Crystal Mcknight  Procedure(s) Performed: LEFT BREAST LUMPECTOMY WITH RADIOACTIVE SEED LOCALIZATION (Left: Breast)     Patient location during evaluation: PACU Anesthesia Type: General Level of consciousness: sedated Pain management: pain level controlled Vital Signs Assessment: post-procedure vital signs reviewed and stable Respiratory status: spontaneous breathing and respiratory function stable Cardiovascular status: stable Postop Assessment: no apparent nausea or vomiting Anesthetic complications: no   No notable events documented.  Last Vitals:  Vitals:   03/25/22 1130 03/25/22 1153  BP: 112/81 117/83  Pulse: 78 83  Resp: 15 18  Temp:  (!) 36.3 C  SpO2: 92% 98%    Last Pain:  Vitals:   03/25/22 1153  TempSrc: Oral  PainSc: 0-No pain                 Yanna Leaks DANIEL

## 2022-03-25 NOTE — Transfer of Care (Signed)
Immediate Anesthesia Transfer of Care Note  Patient: Crystal Mcknight  Procedure(s) Performed: LEFT BREAST LUMPECTOMY WITH RADIOACTIVE SEED LOCALIZATION (Left: Breast)  Patient Location: PACU  Anesthesia Type:General  Level of Consciousness: sedated  Airway & Oxygen Therapy: Patient Spontanous Breathing and Patient connected to face mask oxygen  Post-op Assessment: Report given to RN and Post -op Vital signs reviewed and stable  Post vital signs: Reviewed and stable  Last Vitals:  Vitals Value Taken Time  BP    Temp    Pulse 93 03/25/22 1043  Resp    SpO2 100 % 03/25/22 1043  Vitals shown include unvalidated device data.  Last Pain:  Vitals:   03/25/22 0924  TempSrc: Oral  PainSc: 0-No pain         Complications: No notable events documented.

## 2022-03-25 NOTE — Op Note (Signed)
LEFT BREAST LUMPECTOMY WITH RADIOACTIVE SEED LOCALIZATION  Procedure Note  Crystal Mcknight 03/25/2022   Pre-op Diagnosis: LEFT BREAST ATYPICAL CELLS     Post-op Diagnosis: same  Procedure(s): LEFT BREAST LUMPECTOMY WITH RADIOACTIVE SEED LOCALIZATION  Surgeon(s): Coralie Keens, MD  Anesthesia: General  Staff:  Circulator: Izora Ribas, RN Scrub Person: Malachy Mood E, NT  Estimated Blood Loss: Minimal               Specimens: sent to path  Indications: This is a 46 year old female had undergone screening MRI of her breast when several abnormalities were seen in both breast.  She had multiple biopsies bilaterally with only 1 on the left showing flat atypical cells consistent with atypical ductal hyperplasia.  Surgical excision of the area in the left breast was recommended for histologic evaluation to rule out a malignancy  Procedure: The patient was brought to the operating room and identifies correct patient.  She was placed upon on the operating table and general anesthesia was induced.  Her left breast and chest were then prepped and draped in the usual sterile fashion.  Using the neoprobe I located the radioactive seed near the 12 o'clock position of the left breast several centimeters from the nipple.  I anesthetized the upper edge of the areola with Marcaine and then made a circumareolar incision with a scalpel.  I then dissected into the breast tissue with electrocautery.  I then dissected superiorly toward the area of the radioactive seed.  Using the neoprobe I stayed around the radioactive seed signal perform the lumpectomy with the cautery.  Once the specimen was removed, I marked all margins with paint.  An x-ray was performed on the specimen confirming that the radioactive seed and previous biopsy clips were in the specimen.  The specimen was then sent to pathology for evaluation.  I achieved hemostasis at the lumpectomy cavity with the cautery.  I injected further  Marcaine into the incision.  I then closed the subcutaneous tissue with interrupted 3-0 Vicryl sutures and closed the skin with a running 4-0 Monocryl.  Dermabond was then applied.  The patient was placed in a breast binder.  She was then extubated in the operating room and taken in a stable condition to the recovery room.  All counts were correct at the end of the procedure.          Coralie Keens   Date: 03/25/2022  Time: 10:38 AM

## 2022-03-25 NOTE — Interval H&P Note (Signed)
History and Physical Interval Note:no change in H and P  03/25/2022 9:40 AM  Crystal Mcknight  has presented today for surgery, with the diagnosis of LEFT BREAST ATYPICAL CELLS.  The various methods of treatment have been discussed with the patient and family. After consideration of risks, benefits and other options for treatment, the patient has consented to  Procedure(s): LEFT BREAST LUMPECTOMY WITH RADIOACTIVE SEED LOCALIZATION (Left) as a surgical intervention.  The patient's history has been reviewed, patient examined, no change in status, stable for surgery.  I have reviewed the patient's chart and labs.  Questions were answered to the patient's satisfaction.     Coralie Keens

## 2022-03-25 NOTE — Anesthesia Procedure Notes (Signed)
Procedure Name: LMA Insertion Date/Time: 03/25/2022 10:05 AM  Performed by: Willa Frater, CRNAPre-anesthesia Checklist: Patient identified, Emergency Drugs available, Suction available and Patient being monitored Patient Re-evaluated:Patient Re-evaluated prior to induction Oxygen Delivery Method: Circle system utilized Preoxygenation: Pre-oxygenation with 100% oxygen Induction Type: IV induction Ventilation: Mask ventilation without difficulty LMA: LMA inserted LMA Size: 4.0 Number of attempts: 1 Airway Equipment and Method: Bite block Placement Confirmation: positive ETCO2 Tube secured with: Tape Dental Injury: Teeth and Oropharynx as per pre-operative assessment

## 2022-03-25 NOTE — Discharge Instructions (Addendum)
Post Anesthesia Home Care Instructions  Activity: Get plenty of rest for the remainder of the day. A responsible individual must stay with you for 24 hours following the procedure.  For the next 24 hours, DO NOT: -Drive a car -Paediatric nurse -Drink alcoholic beverages -Take any medication unless instructed by your physician -Make any legal decisions or sign important papers.  Meals: Start with liquid foods such as gelatin or soup. Progress to regular foods as tolerated. Avoid greasy, spicy, heavy foods. If nausea and/or vomiting occur, drink only clear liquids until the nausea and/or vomiting subsides. Call your physician if vomiting continues.  Special Instructions/Symptoms: Your throat may feel dry or sore from the anesthesia or the breathing tube placed in your throat during surgery. If this causes discomfort, gargle with warm salt water. The discomfort should disappear within 24 hours.  If you had a scopolamine patch placed behind your ear for the management of post- operative nausea and/or vomiting:  1. The medication in the patch is effective for 72 hours, after which it should be removed.  Wrap patch in a tissue and discard in the trash. Wash hands thoroughly with soap and water. 2. You may remove the patch earlier than 72 hours if you experience unpleasant side effects which may include dry mouth, dizziness or visual disturbances. 3. Avoid touching the patch. Wash your hands with soap and water after contact with the patch.   [place] Office Phone Number [phone]  BREAST BIOPSY/ PARTIAL MASTECTOMY: POST OP INSTRUCTIONS  Always review your discharge instruction sheet given to you by the facility where your surgery was performed.  IF YOU HAVE DISABILITY OR FAMILY LEAVE FORMS, YOU MUST BRING THEM TO THE OFFICE FOR PROCESSING.  DO NOT GIVE THEM TO YOUR DOCTOR.  A prescription for pain medication may be given to you upon discharge.  Take your pain  medication as prescribed, if needed.  If narcotic pain medicine is not needed, then you may take acetaminophen (Tylenol) or ibuprofen (Advil) as needed. Take your usually prescribed medications unless otherwise directed If you need a refill on your pain medication, please contact your pharmacy.  They will contact our office to request authorization.  Prescriptions will not be filled after 5pm or on week-ends. You should eat very light the first 24 hours after surgery, such as soup, crackers, pudding, etc.  Resume your normal diet the day after surgery. Most patients will experience some swelling and bruising in the breast.  Ice packs and a good support bra will help.  Swelling and bruising can take several days to resolve.  It is common to experience some constipation if taking pain medication after surgery.  Increasing fluid intake and taking a stool softener will usually help or prevent this problem from occurring.  A mild laxative (Milk of Magnesia or Miralax) should be taken according to package directions if there are no bowel movements after 48 hours. Unless discharge instructions indicate otherwise, you may remove your bandages 24-48 hours after surgery, and you may shower at that time.  You may have steri-strips (small skin tapes) in place directly over the incision.  These strips should be left on the skin for 7-10 days.  If your surgeon used skin glue on the incision, you may shower in 24 hours.  The glue will flake off over the next 2-3 weeks.  Any sutures or staples will be removed at the office during your follow-up visit. ACTIVITIES:  You may resume regular daily activities (gradually increasing) beginning  the next day.  Wearing a good support bra or sports bra minimizes pain and swelling.  You may have sexual intercourse when it is comfortable. You may drive when you no longer are taking prescription pain medication, you can comfortably wear a seatbelt, and you can safely maneuver your car and  apply brakes. RETURN TO WORK:  ______________________________________________________________________________________ Dennis Bast should see your doctor in the office for a follow-up appointment approximately two weeks after your surgery.  Your doctor's nurse will typically make your follow-up appointment when she calls you with your pathology report.  Expect your pathology report 2-3 business days after your surgery.  You may call to check if you do not hear from [place] after three days. OTHER INSTRUCTIONS: YOU MAY REMOVE THE BINDER AND SHOWER STARTING TOMORROW AND THEN WEAR WHAT EVER IS MOST COMFORTABLE FOR YOU ICE PACK, TYLENOL, AND IBUPROFEN ALSO FOR PAIN NO VIGOROUS ACTIVITY FOR ONE WEEK _______________________________________________________________________________________________ _____________________________________________________________________________________________________________________________________ _____________________________________________________________________________________________________________________________________ _____________________________________________________________________________________________________________________________________  WHEN TO CALL YOUR DOCTOR: Fever over 101.0 Nausea and/or vomiting. Extreme swelling or bruising. Continued bleeding from incision. Increased pain, redness, or drainage from the incision.  The clinic staff is available to answer your questions during regular business hours.  Please don't hesitate to call and ask to speak to one of the nurses for clinical concerns.  If you have a medical emergency, go to the nearest emergency room or call 911.  A surgeon from [facility] is always on call at the hospital.  For further questions, please visit [email or web address]    Next dose of Tylenol my be given at 4p

## 2022-03-26 ENCOUNTER — Encounter (HOSPITAL_BASED_OUTPATIENT_CLINIC_OR_DEPARTMENT_OTHER): Payer: Self-pay | Admitting: Surgery

## 2022-03-29 LAB — SURGICAL PATHOLOGY

## 2022-04-21 DIAGNOSIS — M25572 Pain in left ankle and joints of left foot: Secondary | ICD-10-CM | POA: Diagnosis not present

## 2022-04-22 DIAGNOSIS — Y999 Unspecified external cause status: Secondary | ICD-10-CM | POA: Diagnosis not present

## 2022-04-22 DIAGNOSIS — S93492A Sprain of other ligament of left ankle, initial encounter: Secondary | ICD-10-CM | POA: Diagnosis not present

## 2022-04-22 DIAGNOSIS — G8918 Other acute postprocedural pain: Secondary | ICD-10-CM | POA: Diagnosis not present

## 2022-04-22 DIAGNOSIS — S82842A Displaced bimalleolar fracture of left lower leg, initial encounter for closed fracture: Secondary | ICD-10-CM | POA: Diagnosis not present

## 2022-04-22 DIAGNOSIS — X58XXXA Exposure to other specified factors, initial encounter: Secondary | ICD-10-CM | POA: Diagnosis not present

## 2022-04-22 DIAGNOSIS — S93432A Sprain of tibiofibular ligament of left ankle, initial encounter: Secondary | ICD-10-CM | POA: Diagnosis not present

## 2022-04-22 HISTORY — PX: OPEN REDUCTION INTERNAL FIXATION (ORIF) TIBIA/FIBULA FRACTURE: SHX5992

## 2022-05-03 DIAGNOSIS — S82842D Displaced bimalleolar fracture of left lower leg, subsequent encounter for closed fracture with routine healing: Secondary | ICD-10-CM | POA: Diagnosis not present

## 2022-05-31 DIAGNOSIS — S82842D Displaced bimalleolar fracture of left lower leg, subsequent encounter for closed fracture with routine healing: Secondary | ICD-10-CM | POA: Diagnosis not present

## 2022-06-04 ENCOUNTER — Other Ambulatory Visit: Payer: Self-pay

## 2022-06-04 ENCOUNTER — Ambulatory Visit: Payer: BC Managed Care – PPO | Attending: Orthopedic Surgery

## 2022-06-04 DIAGNOSIS — R262 Difficulty in walking, not elsewhere classified: Secondary | ICD-10-CM | POA: Diagnosis not present

## 2022-06-04 DIAGNOSIS — M6281 Muscle weakness (generalized): Secondary | ICD-10-CM | POA: Insufficient documentation

## 2022-06-04 DIAGNOSIS — M25672 Stiffness of left ankle, not elsewhere classified: Secondary | ICD-10-CM | POA: Diagnosis not present

## 2022-06-04 DIAGNOSIS — R252 Cramp and spasm: Secondary | ICD-10-CM

## 2022-06-04 DIAGNOSIS — M25572 Pain in left ankle and joints of left foot: Secondary | ICD-10-CM | POA: Insufficient documentation

## 2022-06-04 NOTE — Therapy (Signed)
OUTPATIENT PHYSICAL THERAPY LOWER EXTREMITY EVALUATION   Patient Name: Crystal Mcknight MRN: [id number] DOB:1975/10/29, 47 y.o., female Today's Date: 06/04/2022  END OF SESSION:  PT End of Session - 06/04/22 0855     Visit Number 1    Date for PT Re-Evaluation 07/30/22    Authorization Type BLUE [employer]    Progress Note Due on Visit 10    PT Start Time 0800    PT Stop Time 0845    PT Time Calculation (min) 45 min    Activity Tolerance Patient tolerated treatment well    Behavior During Therapy [facility] for tasks assessed/performed             Past Medical History:  Diagnosis Date   Arthritis    hands,pt denies 11/11/21   Depression    Hypothyroidism    Pelvic pain    PONV (postoperative nausea and vomiting)    Substance abuse ([place])    ETOH,RECOVERY FOR 4 YEARS UPDATED 11/11/21   SVD (spontaneous vaginal delivery)    x 2   Thyroid disease    Past Surgical History:  Procedure Laterality Date   APPENDECTOMY     BREAST LUMPECTOMY WITH RADIOACTIVE SEED LOCALIZATION Left 03/25/2022   Procedure: LEFT BREAST LUMPECTOMY WITH RADIOACTIVE SEED LOCALIZATION;  Surgeon: Coralie Keens, MD;  Location: [facility];  Service: General;  Laterality: Left;   LAPAROSCOPY N/A 05/31/2013   Procedure: LAPAROSCOPY OPERATIVE, FULGURATION OF ENDOMETRIOSIS, REMOVAL OF BILATERAL FALLOPIAN TUBE CYST;  Surgeon: Marylynn Pearson, MD;  Location: [facility];  Service: Gynecology;  Laterality: N/A;   RHINOPLASTY     WISDOM TOOTH EXTRACTION     Patient Active Problem List   Diagnosis Date Noted   Hypothyroidism 06/05/2018   PTSD (post-traumatic stress disorder) 06/05/2018   Clinodactyly 09/28/2012   Degenerative arthritis of finger 09/28/2012    PCP: Velna Hatchet, MD   REFERRING PROVIDER: Renette Butters, MD  REFERRING DIAG: S82.892 left ankle fracture with ORIF  THERAPY DIAG:  Stiffness of left ankle, not elsewhere classified - Plan: PT plan of care  cert/re-cert  Pain in left ankle and joints of left foot - Plan: PT plan of care cert/re-cert  Difficulty in walking, not elsewhere classified - Plan: PT plan of care cert/re-cert  Muscle weakness (generalized) - Plan: PT plan of care cert/re-cert  Cramp and spasm - Plan: PT plan of care cert/re-cert  Rationale for Evaluation and Treatment: Rehabilitation  ONSET DATE: 06/04/2022  SUBJECTIVE:   SUBJECTIVE STATEMENT: Patient explains she was in [place] when she her foot slid into a ditch and she fractured her ankle.  She flew home to seek treatment and underwent ORIF.  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  She feels she wasn't in the best shape when she sustained the injury but would like to reach full recovery so that she can work on getting back in shape and doing things she enjoys doing such as snow skiing.    PERTINENT HISTORY: Recent lumpectomy PAIN:  Are you having pain? Yes: NPRS scale: mild at rest but sharp, shooting with weight bearing/10 Pain location: left ankle Pain description: sharp shooting Aggravating factors: weight bearing Relieving factors: rest, limiting wb  PRECAUTIONS: Other: currently 6 weeks post op, ease into weight bearing in boot for now ,then wean from boot by 8 weeks.  WEIGHT BEARING RESTRICTIONS: No  FALLS:  Has patient fallen in last 6 months? Yes. Number of falls 1  LIVING ENVIRONMENT: Lives with: lives with their family and lives with their spouse Lives in: House/apartment Stairs: Yes: Internal: multiple steps; rails and External: yes steps; yes Has following equipment at home: Crutches  OCCUPATION: nurse  PLOF: Independent, Independent with basic ADLs, Independent with household mobility without device, Independent with community mobility without device, Independent with homemaking with ambulation, Independent with gait, and Independent with  transfers  PATIENT GOALS: To resume her usual fitness level and be able to be active again, would like to snow ski at end of March.   NEXT MD VISIT:   OBJECTIVE:   DIAGNOSTIC FINDINGS: na  PATIENT SURVEYS:  FOTO complete next visit  COGNITION: Overall cognitive status: Within functional limits for tasks assessed     SENSATION: Decreased proprioception due to casted and in boot with NWB status    LOWER EXTREMITY ROM:  Active ROM Right eval Left eval  Hip flexion    Hip extension    Hip abduction    Hip adduction    Hip internal rotation    Hip external rotation    Knee flexion    Knee extension    Ankle dorsiflexion  0  Ankle plantarflexion  30  Ankle inversion  30  Ankle eversion  10   (Blank rows = not tested)  LOWER EXTREMITY MMT:  Deferred due to healing   FUNCTIONAL TESTS:  5 times sit to stand: deferred due to WB status (complete next visit) Timed up and go (TUG): Deferred due to WB status (can complete next visit with single crutch) Deferred til next visit  GAIT: Distance walked: 100 ft Assistive device utilized: Crutches Level of assistance: SBA Comments: antalgic, in boot   TODAY'S TREATMENT:                                                                                                                              DATE: 06/04/22 Initial eval completed and initiated HEP Gait training with single axillary crutch in boot    PATIENT EDUCATION:  Education details: Instructed in protocol and how to progress weight bearing, gait training with single axillary crutch, initiated HEP Person educated: Patient Education method: Explanation, Demonstration, Verbal cues, and Handouts Education comprehension: verbalized understanding, returned demonstration, and verbal cues required  HOME EXERCISE PROGRAM: Access Code: [email or web address] URL: [email or web address] Date: 06/04/2022 Prepared by: Candyce Churn  Exercises - Supine Single Leg Ankle  Pumps  - 1 x daily - 7 x weekly - 3 sets - 10 reps - Seated Ankle Alphabet  - 1 x daily - 7 x weekly - 3 sets - 10 reps - Seated Ankle Dorsiflexion Stretch  - 1 x daily - 7 x weekly - 3 sets - 10 reps - Seated Toe Raise  - 1 x daily - 7 x weekly - 3 sets - 10 reps  ASSESSMENT:  CLINICAL IMPRESSION: Patient is a 47 y.o.  female who was seen today for physical therapy evaluation and treatment for 6 weeks post ORIF for left ankle fracture.  She presents with decreased ROM, strength and function along with elevated pain.  She would benefit from skilled PT for post ORIF ankle fracture protocol.    OBJECTIVE IMPAIRMENTS: Abnormal gait, decreased balance, decreased mobility, difficulty walking, decreased ROM, decreased strength, increased fascial restrictions, increased muscle spasms, impaired flexibility, impaired sensation, and pain.   ACTIVITY LIMITATIONS: carrying, lifting, bending, standing, squatting, stairs, transfers, toileting, dressing, and caring for others  PARTICIPATION LIMITATIONS: meal prep, cleaning, laundry, driving, shopping, community activity, occupation, and yard work  PERSONAL FACTORS: 1 comorbidity: recent lumpectomy  are also affecting patient's functional outcome.   REHAB POTENTIAL: Excellent  CLINICAL DECISION MAKING: Stable/uncomplicated  EVALUATION COMPLEXITY: Low   GOALS: Goals reviewed with patient? Yes  SHORT TERM GOALS: Target date: 07/02/2022   Patient will be independent with initial HEP  Baseline: Goal status: INITIAL  2.  Pain report to be no greater than 4/10  Baseline:  Goal status: INITIAL  3.  Patient to be able to ambulate in boot without crutches safely Baseline:  Goal status: INITIAL  4.  Ankle ROM to improve by 3-5 degrees in each plane of motion Baseline:  Goal status: INITIAL  5.  Patient to be able to wean from boot at home Baseline:  Goal status: INITIAL    LONG TERM GOALS: Target date: 07/30/2022    Patient to be independent  with advanced HEP  Baseline:  Goal status: INITIAL  2.  Patient to report pain no greater than 2/10  Baseline:  Goal status: INITIAL  3.  Patient to be able to demonstrate normal heel to toe progression in full WB out of boot Baseline:  Goal status: INITIAL  4.  5 times sit to stand to be less than 10 sec Baseline:  Goal status: INITIAL  5.  TUG score to be less than 10 sec Baseline:  Goal status: INITIAL  6.  FOTO score to be at or above predicted score Baseline:  Goal status: INITIAL   PLAN:  PT FREQUENCY: 1-2x/week  PT DURATION: 8 weeks  PLANNED INTERVENTIONS: Therapeutic exercises, Therapeutic activity, Neuromuscular re-education, Balance training, Gait training, Patient/Family education, Self Care, Joint mobilization, Stair training, Orthotic/Fit training, DME instructions, Aquatic Therapy, Dry Needling, Electrical stimulation, Cryotherapy, Moist heat, Compression bandaging, scar mobilization, Splintting, Taping, Vasopneumatic device, Ionotophoresis '4mg'$ /ml Dexamethasone, Manual therapy, and Re-evaluation  PLAN FOR NEXT SESSION: Complete FOTO, TUG and 5 times sit to stand, NuSTep, ankle active and passive ROM, work on [employer] [place], PT 06/04/22 9:57 AM  [facility] [address], [place], [place] [address] Phone # [phone] Fax [phone]

## 2022-06-07 ENCOUNTER — Ambulatory Visit: Payer: BC Managed Care – PPO | Admitting: Internal Medicine

## 2022-06-07 ENCOUNTER — Encounter: Payer: Self-pay | Admitting: Internal Medicine

## 2022-06-07 VITALS — BP 108/62 | HR 74 | Ht 66.0 in | Wt 141.0 lb

## 2022-06-07 DIAGNOSIS — E039 Hypothyroidism, unspecified: Secondary | ICD-10-CM

## 2022-06-07 LAB — T4, FREE: Free T4: 0.96 ng/dL (ref 0.60–1.60)

## 2022-06-07 LAB — TSH: TSH: 3.2 u[IU]/mL (ref 0.35–5.50)

## 2022-06-07 MED ORDER — LEVOTHYROXINE SODIUM 150 MCG PO TABS: 150.0000 ug | ORAL_TABLET | ORAL | 3 refills | Status: DC

## 2022-06-07 MED ORDER — LEVOTHYROXINE SODIUM 175 MCG PO TABS: ORAL_TABLET | ORAL | 3 refills | Status: DC

## 2022-06-07 NOTE — Patient Instructions (Signed)
Please continue Levothyroxine 150 mcg alternating with 175 mcg every other day.  Please stop at the lab.  You should have an endocrinology follow-up appointment in 1 year.

## 2022-06-07 NOTE — Progress Notes (Signed)
Patient ID: Crystal Mcknight, female   DOB: Aug 22, 1975, 47 y.o.   MRN: [id number]   HPI  Crystal Mcknight is a 47 y.o.-year-old female, referred by her PCP, Chrzanowski, Jami B, NP returning for follow-up for of hypothyroidism. She moved to [place] from [place] in 2009.  She was followed by endocrinology there.  Last visit with me 1 year ago.  Interim history: At today's visit, she has no complaints: No fatigue, significant weight changes, constipation, cold intolerance. Since last visit, she had an abnormal breast biopsy and had to have a lumpectomy.  Pathology was benign. She had a colonoscopy >> premalignant polyps removed. She fell and fractured her L tibia when in [place] 04/15/2022 >> as to have surgery >> now in boot - walks with crutches.  Reviewed and addended history: Pt. has been dx with hypothyroidism in 1990s (at 13-19 y/o). She also has a h/o postpartum thyroiditis after both pregnancies - last after the birth of her son.  She had a thyroid ultrasound before moving from [place] and it was mentioned that her thyroid was not visible anymore.  She was previously on Synthroid DAW 175 + 25 mcg, dose increased 06/21/2019.  At that time, she saw her OB/GYN Dr. and she had several symptoms including fatigue, constipation, hair loss. A TSH was checked and it was high.  2 days prior to our appointment in 06/2019 she woke up with vertigo >> saw PCP >> TFTs repeated: normal. She had vertigo 12 years ago >> investigated by MRI >> no pathology but she had appendicitis few days later so the vertigo was attributed to the acute episode.  We previously decreased the dose of her levothyroxine to 150 mcg daily as her TSH was suppressed. She felt terrible on this dose (VERY tired) >> she increased to 175 mcg daily and her fatigue improved.  Since TFTs returns normal, we continued the same dose.  However, afterwards, her TSH returned suppressed and I recommend decrease the dose to 150 mcg daily.  Due to  previous fatigue on this dose, she increased it back to 175 mcg daily.  Since TSH remained suppressed, again advised her to switch 150 mcg daily.  In 07/2021, we had to switch to 175 alternating with 150 mcg every other day.  She takes the Synthroid: - in am (around 5-6 AM) - fasting - at least 30 min from b'fast - no calcium - no iron - + multivitamins, Mg, vitamin D - at night - + Turmeric and B complex (not today) - 2 hours after LT4 - no PPIs  Reviewed her TFTs: Lab Results  Component Value Date   TSH 2.01 11/18/2021   TSH 4.79 08/05/2021   TSH 1.06 02/23/2021   TSH 0.91 12/04/2020   TSH 0.22 (L) 10/15/2020   TSH 1.35 06/04/2020   TSH 0.59 01/08/2020   TSH 3.93 11/29/2019   TSH 0.02 (L) 09/19/2019   TSH 0.33 (L) 08/16/2019   FREET4 1.14 11/18/2021   FREET4 0.93 08/05/2021   FREET4 0.94 02/23/2021   FREET4 1.00 12/04/2020   FREET4 1.13 10/15/2020   FREET4 0.98 06/04/2020   FREET4 0.90 01/08/2020   FREET4 0.87 11/29/2019   FREET4 1.48 09/19/2019   FREET4 1.14 08/16/2019   Prev: Labs from [facility] -08/12/2020: TSH 0.349 (0.45-4.5) Vitamin D 69.4 HbA1c 5.5% Lipids: 16/92/51/88-fasting Glucose 89, BUN/creatinine 12/0.81, GFR 91, with the rest of the CMP normal We decreased  her levothyroxine dose to 150 mcg daily.  06/21/2019: TSH 7.120 (0.45-4.5), total  T4 7.2 (4.5-12) - dose increased from 175 to 200 mcg daily 07/29/2018: TSH 3.29  Her antithyroid antibodies were not elevated: Component     Latest Ref Rng & Units 07/11/2019  Thyroperoxidase Ab SerPl-aCnc     <9 IU/mL 1  Thyroglobulin Ab     < or = 1 IU/mL <1   In 2021, due to the increased levothyroxine requirements and also due to the fact that she was complaining of bloating and she had family history of autoimmune diseases, we tested her for celiac disease.  The investigation was negative: Component     Latest Ref Rng & Units 07/11/2019  Immunoglobulin A     47 - 310 mg/dL 159  (tTG) Ab, IgG      U/mL 3  (tTG) Ab, IgA     U/mL 1  Gliadin IgA     Units 9  Deamidated Gliadin Abs, IgG     Units 1   Vitamin D level was normal: 08/12/2020: Vitamin D 69.4. Lab Results  Component Value Date   VD25OH 48.03 07/11/2019  She takes vitamin D.  In the past, she described fatigue, cold intolerance, constipation, hair loss on forearms, anxiety/depression.  She had normal menses but with increased flow and also history of endometriosis with increased cramping.  She continues to see OB/GYN.  Pt denies: - feeling nodules in neck - hoarseness - dysphagia - choking  She has + FH of thyroid disorders in: father and P aunts. Father had Myasthenia Gravis and aunts with SLE. No FH of thyroid cancer. No h/o radiation tx to head or neck. No recent steroids use.   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  ROS: + see HPI  I reviewed pt's medications, allergies, PMH, social hx, family hx, and changes were documented in the history of present illness. Otherwise, unchanged from my initial visit note.  Past Medical History:  Diagnosis Date   Arthritis    hands,pt denies 11/11/21   Depression    Hypothyroidism    Pelvic pain    PONV (postoperative nausea and vomiting)    Substance abuse ([place])    ETOH,RECOVERY FOR 4 YEARS UPDATED 11/11/21   SVD (spontaneous vaginal delivery)    x 2   Thyroid disease    Past Surgical History:  Procedure Laterality Date   APPENDECTOMY     BREAST LUMPECTOMY WITH RADIOACTIVE SEED LOCALIZATION Left 03/25/2022   Procedure: LEFT BREAST LUMPECTOMY WITH RADIOACTIVE SEED LOCALIZATION;  Surgeon: Coralie Keens, MD;  Location: [facility];  Service: General;  Laterality: Left;   LAPAROSCOPY N/A 05/31/2013   Procedure: LAPAROSCOPY OPERATIVE, FULGURATION OF ENDOMETRIOSIS, REMOVAL OF BILATERAL FALLOPIAN TUBE CYST;  Surgeon: Marylynn Pearson, MD;  Location: [facility];  Service: Gynecology;  Laterality: N/A;    RHINOPLASTY     WISDOM TOOTH EXTRACTION     Social History   Socioeconomic History   Marital status: Married    Spouse name: Not on file   Number of children: 2   Years of education: Not on file   Highest education level: Not on file  Occupational History   Not on file  Tobacco Use   Smoking status: Never Smoker   Smokeless tobacco: Never Used  Substance and Sexual Activity   Alcohol use: No, stopped   Drug use: No   Sexual activity: Yes    Birth control/protection: None    Comment: husband vasectomy  Other Topics Concern   Not  on file  Social History Narrative   Not on file   Social Determinants of Health   Financial Resource Strain:    Difficulty of Paying Living Expenses: Not on file  Food Insecurity:    Worried About Charity fundraiser in the Last Year: Not on file   [employer] of Food in the Last Year: Not on file  Transportation Needs:    Lack of Transportation (Medical): Not on file   Lack of Transportation (Non-Medical): Not on file  Physical Activity:    Days of Exercise per Week: Not on file   Minutes of Exercise per Session: Not on file  Stress:    Feeling of Stress : Not on file  Social Connections:    Frequency of Communication with Friends and Family: Not on file   Frequency of Social Gatherings with Friends and Family: Not on file   Attends Religious Services: Not on file   Active Member of Clubs or Organizations: Not on file   Attends Archivist Meetings: Not on file   Marital Status: Not on file  Intimate Partner Violence:    Fear of Current or Ex-Partner: Not on file   Emotionally Abused: Not on file   Physically Abused: Not on file   Sexually Abused: Not on file   Current Outpatient Medications  Medication Sig Dispense Refill   B Complex-C (B-COMPLEX WITH VITAMIN C) tablet B Complex     fluticasone (FLONASE) 50 MCG/ACT nasal spray Place into both nostrils daily.     levothyroxine (SYNTHROID) 150 MCG tablet Take 1 tablet (150 mcg  total) by mouth every other day. 45 tablet 1   levothyroxine (SYNTHROID) 175 MCG tablet TAKE 1 TABLET(175 MCG) BY MOUTH EVERY OTHER DAY 45 tablet 1   Multiple Vitamins-Minerals (MULTIVITAMIN ADULT EXTRA C PO) daily.     sertraline (ZOLOFT) 50 MG tablet Take 50 mg by mouth daily.     traMADol (ULTRAM) 50 MG tablet Take 1 tablet (50 mg total) by mouth every 6 (six) hours as needed for moderate pain or severe pain. 25 tablet 0   traZODone (DESYREL) 50 MG tablet Take at bedtime as directed 90 tablet 1   No current facility-administered medications for this visit.    Allergies  Allergen Reactions   Penicillins Rash    When she was 47years old, had a rash when took this for strep throat   Family History  Problem Relation Age of Onset   Breast cancer Mother    Colon polyps Mother    Cancer Mother    Hyperlipidemia Mother    Colon polyps Father    Hyperlipidemia Father    Hypertension Father    Diverticulosis Father    Cancer Maternal Grandmother    Heart disease Maternal Grandfather    Stomach cancer Paternal Grandmother    Cancer Paternal Grandmother    Heart disease Paternal Grandfather    Colon cancer Neg Hx    Crohn's disease Neg Hx    Esophageal cancer Neg Hx    Rectal cancer Neg Hx   Also, breast cancer in mother. + see HPI  PE: BP 108/62 (BP Location: Right Arm, Patient Position: Sitting, Cuff Size: Normal)   Pulse 74   Ht '5\' 6"'$  (1.676 m)   Wt 141 lb (64 kg)   SpO2 99%   BMI 22.76 kg/m  Wt Readings from Last 3 Encounters:  06/07/22 141 lb (64 kg)  03/25/22 141 lb 12.1 oz (64.3 kg)  12/09/21 142  lb 6.4 oz (64.6 kg)   Constitutional: Normal weight, in NAD Eyes:  EOMI, no exophthalmos ENT: no neck masses, no cervical lymphadenopathy Cardiovascular: RRR, No MRG Respiratory: CTA B Musculoskeletal: no deformities; left foot in boot Skin:no rashes Neurological: no tremor with outstretched hands  ASSESSMENT: 1. Hypothyroidism  PLAN:  1. Patient with longstanding  hypothyroidism, on  LT4  therapy.  She was previously on Synthroid d.a.w., but this is not covered by her insurance anymore.  Her TFTs fluctuated mostly in the normal range, however, in 05/2019, TSH was elevated and she had hypothyroid symptoms.  At that time, her levothyroxine dose was increased from 175 to 200 mcg daily.  She was also referred to endocrinology afterwards.  Since then, we were able to decrease the dose to 150 mcg daily but she felt very tired on this and went back to the 175 mcg dose.  Afterwards, TSH was suppressed and we were able to decrease the dose back to 150 mcg daily, which she continues today. -She has no neck compression symptoms.  Thyroid ultrasound obtained when she was still in [place] showed that the thyroid gland disintegrated.  We discussed that this may be a consequence of thyroiditis (possibly Hashimoto's).  We did check her TPO and ATA antibodies and they were not elevated. - latest thyroid labs reviewed with pt. >> normal: Lab Results  Component Value Date   TSH 2.01 11/18/2021  - she continues on LT4 150 mcg alternating with 175 mcg every other day - pt feels good on this dose, without hypothyroid complaints -She is also on a B complex with a low amount of biotin-30 mcg. - we discussed about taking the thyroid hormone every day, with water, >30 minutes before breakfast, separated by >4 hours from acid reflux medications, calcium, iron, multivitamins. Pt. is taking it correctly. - will check thyroid tests today: TSH and fT4 - If labs are abnormal, she will need to return for repeat TFTs in 1.5 months -OTW, I will see her back in a year  - Total time spent for the visit: 25 min, in precharting, obtaining medical information from the chart, reviewing her  previous labs, imaging evaluations, and treatments, reviewing her symptoms, counseling her about her condition (please see the discussed topics above), and developing a plan to further investigate and treat it; she  had a number of questions which I addressed.  Component     Latest Ref Rng 06/07/2022  TSH     0.35 - 5.50 uIU/mL 3.20   T4,Free(Direct)     0.60 - 1.60 ng/dL 0.96   TFTs are normal.  I refilled her levothyroxine.  Philemon Kingdom, MD PhD [facility]

## 2022-06-08 ENCOUNTER — Ambulatory Visit: Payer: BC Managed Care – PPO

## 2022-06-08 DIAGNOSIS — R252 Cramp and spasm: Secondary | ICD-10-CM

## 2022-06-08 DIAGNOSIS — R262 Difficulty in walking, not elsewhere classified: Secondary | ICD-10-CM | POA: Diagnosis not present

## 2022-06-08 DIAGNOSIS — M25672 Stiffness of left ankle, not elsewhere classified: Secondary | ICD-10-CM | POA: Diagnosis not present

## 2022-06-08 DIAGNOSIS — M25572 Pain in left ankle and joints of left foot: Secondary | ICD-10-CM

## 2022-06-08 DIAGNOSIS — M6281 Muscle weakness (generalized): Secondary | ICD-10-CM | POA: Diagnosis not present

## 2022-06-08 NOTE — Therapy (Signed)
OUTPATIENT PHYSICAL THERAPY LOWER EXTREMITY TREATMENT NOTE   Patient Name: Crystal Mcknight MRN: [id number] DOB:Apr 21, 1976, 47 y.o., female Today's Date: 06/08/2022  END OF SESSION:  PT End of Session - 06/08/22 1155     Visit Number 2    Date for PT Re-Evaluation 07/30/22    Authorization Type BL[employer]    Progress Note Due on Visit 10    PT Start Time 1145    PT Stop Time 1230    PT Time Calculation (min) 45 min    Activity Tolerance Patient tolerated treatment well    Behavior During Therapy [facility] for tasks assessed/performed             Past Medical History:  Diagnosis Date   Arthritis    hands,pt denies 11/11/21   Depression    Hypothyroidism    Pelvic pain    PONV (postoperative nausea and vomiting)    Substance abuse ([place])    ETOH,RECOVERY FOR 4 YEARS UPDATED 11/11/21   SVD (spontaneous vaginal delivery)    x 2   Thyroid disease    Past Surgical History:  Procedure Laterality Date   APPENDECTOMY     BREAST LUMPECTOMY WITH RADIOACTIVE SEED LOCALIZATION Left 03/25/2022   Procedure: LEFT BREAST LUMPECTOMY WITH RADIOACTIVE SEED LOCALIZATION;  Surgeon: Coralie Keens, MD;  Location: [place];  Service: General;  Laterality: Left;   LAPAROSCOPY N/A 05/31/2013   Procedure: LAPAROSCOPY OPERATIVE, FULGURATION OF ENDOMETRIOSIS, REMOVAL OF BILATERAL FALLOPIAN TUBE CYST;  Surgeon: Marylynn Pearson, MD;  Location: [facility];  Service: Gynecology;  Laterality: N/A;   RHINOPLASTY     WISDOM TOOTH EXTRACTION     Patient Active Problem List   Diagnosis Date Noted   Hypothyroidism 06/05/2018   PTSD (post-traumatic stress disorder) 06/05/2018   Clinodactyly 09/28/2012   Degenerative arthritis of finger 09/28/2012    PCP: Velna Hatchet, MD   REFERRING PROVIDER: Renette Butters, MD  REFERRING DIAG: S82.892 left ankle fracture with ORIF  THERAPY DIAG:  Stiffness of left ankle, not elsewhere classified  Pain in left ankle and joints of  left foot  Difficulty in walking, not elsewhere classified  Muscle weakness (generalized)  Cramp and spasm  Rationale for Evaluation and Treatment: Rehabilitation  ONSET DATE: 06/04/2022  SUBJECTIVE:   SUBJECTIVE STATEMENT: Patient arrives on bilateral crutches but states she has been doing one crutch at home.  She denies any major pain.  Just the discomfort of weight bearing.    PERTINENT HISTORY: Recent lumpectomy PAIN:  Are you having pain? Yes: NPRS scale: mild at rest but sharp, shooting with weight bearing/10 Pain location: left ankle Pain description: sharp shooting Aggravating factors: weight bearing Relieving factors: rest, limiting wb  PRECAUTIONS: Other: currently 6 weeks post op, ease into weight bearing in boot for now ,then wean from boot by 8 weeks.  WEIGHT BEARING RESTRICTIONS: No  FALLS:  Has patient fallen in last 6 months? Yes. Number of falls 1  LIVING ENVIRONMENT: Lives with: lives with their family and lives with their spouse Lives in: House/apartment Stairs: Yes: Internal: multiple steps; rails and External: yes steps; yes Has following equipment at home: Crutches  OCCUPATION: nurse  PLOF: Independent, Independent with basic ADLs, Independent with household mobility without device, Independent with community mobility without device, Independent with homemaking with ambulation, Independent with gait, and Independent with transfers  PATIENT GOALS: To resume her usual fitness level and be able to be active again, would like to snow ski at end of  March.   NEXT MD VISIT:   OBJECTIVE:   DIAGNOSTIC FINDINGS: na  PATIENT SURVEYS:  FOTO complete next visit  COGNITION: Overall cognitive status: Within functional limits for tasks assessed     SENSATION: Decreased proprioception due to casted and in boot with NWB status    LOWER EXTREMITY ROM:  Active ROM Right eval Left eval  Hip flexion    Hip extension    Hip abduction    Hip  adduction    Hip internal rotation    Hip external rotation    Knee flexion    Knee extension    Ankle dorsiflexion  0  Ankle plantarflexion  30  Ankle inversion  30  Ankle eversion  10   (Blank rows = not tested)  LOWER EXTREMITY MMT:  Deferred due to healing   FUNCTIONAL TESTS:  06/08/22: 5 times sit to stand: 9.01 sec Timed up and go (TUG): 11.26 sec    GAIT: Distance walked: 100 ft Assistive device utilized: Crutches Level of assistance: SBA Comments: antalgic, in boot   TODAY'S TREATMENT:                                                                                                                              DATE: 06/08/22 Nustep for ROM x 6 min Seated heel slides x 20 Seated heel and toe raises x 20 each Completed TUG and 5 times sit to stand as well as FOTO Standing weight shift side to side then fwd and back x 10 each LE Standing at counter, walking fwd and reverse x 3 laps Side stepping at counter x 3 laps Tapping right foot on/off balance pad fwd x 20 then lateral x 20 Gait training without a.d. in boot x 100 feet, focusing on step through gait  DATE: 06/04/22 Initial eval completed and initiated HEP Gait training with single axillary crutch in boot    PATIENT EDUCATION:  Education details: Instructed in protocol and how to progress weight bearing, gait training with single axillary crutch, initiated HEP Person educated: Patient Education method: Explanation, Demonstration, Verbal cues, and Handouts Education comprehension: verbalized understanding, returned demonstration, and verbal cues required  HOME EXERCISE PROGRAM: Access Code: [email or web address] URL: [email or web address] Date: 06/04/2022 Prepared by: Candyce Churn  Exercises - Supine Single Leg Ankle Pumps  - 1 x daily - 7 x weekly - 3 sets - 10 reps - Seated Ankle Alphabet  - 1 x daily - 7 x weekly - 3 sets - 10 reps - Seated Ankle Dorsiflexion Stretch  - 1 x daily - 7 x weekly - 3  sets - 10 reps - Seated Toe Raise  - 1 x daily - 7 x weekly - 3 sets - 10 reps  ASSESSMENT:  CLINICAL IMPRESSION: Mimi is tolerating weight bearing in boot with ease.  She is able to do step through gait today for 100 feet.  Pain is minimal at end of session despite multiple WB tasks.  She is diligent with her HEP and should continue to do well.   She would benefit from skilled PT for post ORIF ankle fracture protocol.    OBJECTIVE IMPAIRMENTS: Abnormal gait, decreased balance, decreased mobility, difficulty walking, decreased ROM, decreased strength, increased fascial restrictions, increased muscle spasms, impaired flexibility, impaired sensation, and pain.   ACTIVITY LIMITATIONS: carrying, lifting, bending, standing, squatting, stairs, transfers, toileting, dressing, and caring for others  PARTICIPATION LIMITATIONS: meal prep, cleaning, laundry, driving, shopping, community activity, occupation, and yard work  PERSONAL FACTORS: 1 comorbidity: recent lumpectomy  are also affecting patient's functional outcome.   REHAB POTENTIAL: Excellent  CLINICAL DECISION MAKING: Stable/uncomplicated  EVALUATION COMPLEXITY: Low   GOALS: Goals reviewed with patient? Yes  SHORT TERM GOALS: Target date: 07/02/2022   Patient will be independent with initial HEP  Baseline: Goal status: INITIAL  2.  Pain report to be no greater than 4/10  Baseline:  Goal status: INITIAL  3.  Patient to be able to ambulate in boot without crutches safely Baseline:  Goal status: INITIAL  4.  Ankle ROM to improve by 3-5 degrees in each plane of motion Baseline:  Goal status: INITIAL  5.  Patient to be able to wean from boot at home Baseline:  Goal status: INITIAL    LONG TERM GOALS: Target date: 07/30/2022    Patient to be independent with advanced HEP  Baseline:  Goal status: INITIAL  2.  Patient to report pain no greater than 2/10  Baseline:  Goal status: INITIAL  3.  Patient to be able to  demonstrate normal heel to toe progression in full WB out of boot Baseline:  Goal status: INITIAL  4.  5 times sit to stand to be less than 10 sec Baseline:  Goal status: INITIAL  5.  TUG score to be less than 10 sec Baseline:  Goal status: INITIAL  6.  FOTO score to be at or above predicted score Baseline:  Goal status: INITIAL   PLAN:  PT FREQUENCY: 1-2x/week  PT DURATION: 8 weeks  PLANNED INTERVENTIONS: Therapeutic exercises, Therapeutic activity, Neuromuscular re-education, Balance training, Gait training, Patient/Family education, Self Care, Joint mobilization, Stair training, Orthotic/Fit training, DME instructions, Aquatic Therapy, Dry Needling, Electrical stimulation, Cryotherapy, Moist heat, Compression bandaging, scar mobilization, Splintting, Taping, Vasopneumatic device, Ionotophoresis '4mg'$ /ml Dexamethasone, Manual therapy, and Re-evaluation  PLAN FOR NEXT SESSION:  NuSTep, ankle active and passive ROM, progress to Full WB in boot without crutches.     Anderson Malta B. Latanza Pfefferkorn, PT 06/08/22 9:34 PM  [facility] [place], [place], [place] [address] Phone # [phone] Fax [phone]

## 2022-06-09 ENCOUNTER — Encounter: Payer: Self-pay | Admitting: Internal Medicine

## 2022-06-09 DIAGNOSIS — E039 Hypothyroidism, unspecified: Secondary | ICD-10-CM

## 2022-06-10 ENCOUNTER — Ambulatory Visit: Payer: BC Managed Care – PPO

## 2022-06-10 DIAGNOSIS — M25572 Pain in left ankle and joints of left foot: Secondary | ICD-10-CM

## 2022-06-10 DIAGNOSIS — R262 Difficulty in walking, not elsewhere classified: Secondary | ICD-10-CM | POA: Diagnosis not present

## 2022-06-10 DIAGNOSIS — M25672 Stiffness of left ankle, not elsewhere classified: Secondary | ICD-10-CM

## 2022-06-10 DIAGNOSIS — R252 Cramp and spasm: Secondary | ICD-10-CM

## 2022-06-10 DIAGNOSIS — M6281 Muscle weakness (generalized): Secondary | ICD-10-CM

## 2022-06-10 MED ORDER — LEVOTHYROXINE SODIUM 150 MCG PO TABS: 150.0000 ug | ORAL_TABLET | ORAL | 3 refills | Status: DC

## 2022-06-10 MED ORDER — LEVOTHYROXINE SODIUM 175 MCG PO TABS: ORAL_TABLET | ORAL | 3 refills | Status: DC

## 2022-06-10 NOTE — Therapy (Signed)
OUTPATIENT PHYSICAL THERAPY LOWER EXTREMITY TREATMENT NOTE   Patient Name: Crystal Mcknight MRN: [id number] DOB:November 28, 1975, 47 y.o., female Today's Date: 06/10/2022  END OF SESSION:  PT End of Session - 06/10/22 1252     Visit Number 3    Date for PT Re-Evaluation 07/30/22    Authorization Type [employer]    Progress Note Due on Visit 10    PT Start Time 1145    PT Stop Time 1230    PT Time Calculation (min) 45 min    Activity Tolerance Patient tolerated treatment well    Behavior During Therapy [facility] for tasks assessed/performed              Past Medical History:  Diagnosis Date   Arthritis    hands,pt denies 11/11/21   Depression    Hypothyroidism    Pelvic pain    PONV (postoperative nausea and vomiting)    Substance abuse ([place])    ETOH,RECOVERY FOR 4 YEARS UPDATED 11/11/21   SVD (spontaneous vaginal delivery)    x 2   Thyroid disease    Past Surgical History:  Procedure Laterality Date   APPENDECTOMY     BREAST LUMPECTOMY WITH RADIOACTIVE SEED LOCALIZATION Left 03/25/2022   Procedure: LEFT BREAST LUMPECTOMY WITH RADIOACTIVE SEED LOCALIZATION;  Surgeon: Coralie Keens, MD;  Location: [place];  Service: General;  Laterality: Left;   LAPAROSCOPY N/A 05/31/2013   Procedure: LAPAROSCOPY OPERATIVE, FULGURATION OF ENDOMETRIOSIS, REMOVAL OF BILATERAL FALLOPIAN TUBE CYST;  Surgeon: Marylynn Pearson, MD;  Location: [facility];  Service: Gynecology;  Laterality: N/A;   RHINOPLASTY     WISDOM TOOTH EXTRACTION     Patient Active Problem List   Diagnosis Date Noted   Hypothyroidism 06/05/2018   PTSD (post-traumatic stress disorder) 06/05/2018   Clinodactyly 09/28/2012   Degenerative arthritis of finger 09/28/2012    PCP: Velna Hatchet, MD   REFERRING PROVIDER: Renette Butters, MD  REFERRING DIAG: S82.892 left ankle fracture with ORIF  THERAPY DIAG:  Stiffness of left ankle, not elsewhere classified  Pain in left ankle and joints of  left foot  Difficulty in walking, not elsewhere classified  Muscle weakness (generalized)  Cramp and spasm  Rationale for Evaluation and Treatment: Rehabilitation  ONSET DATE: 06/04/2022  SUBJECTIVE:   SUBJECTIVE STATEMENT: My hips are hurting and I am having a hard day today.     PERTINENT HISTORY: Recent lumpectomy PAIN:  Are you having pain? Yes: NPRS scale: mild at rest but sharp, shooting with weight bearing 0/10 Pain location: left ankle Pain description: sharp shooting Aggravating factors: weight bearing Relieving factors: rest, limiting wb  PRECAUTIONS: Other: currently 6 weeks post op, ease into weight bearing in boot for now ,then wean from boot by 8 weeks.  WEIGHT BEARING RESTRICTIONS: No  FALLS:  Has patient fallen in last 6 months? Yes. Number of falls 1  LIVING ENVIRONMENT: Lives with: lives with their family and lives with their spouse Lives in: House/apartment Stairs: Yes: Internal: multiple steps; rails and External: yes steps; yes Has following equipment at home: Crutches  OCCUPATION: nurse  PLOF: Independent, Independent with basic ADLs, Independent with household mobility without device, Independent with community mobility without device, Independent with homemaking with ambulation, Independent with gait, and Independent with transfers  PATIENT GOALS: To resume her usual fitness level and be able to be active again, would like to snow ski at end of March.   NEXT MD VISIT:   OBJECTIVE:   DIAGNOSTIC FINDINGS:  na  PATIENT SURVEYS:  FOTO complete next visit  COGNITION: Overall cognitive status: Within functional limits for tasks assessed     SENSATION: Decreased proprioception due to casted and in boot with NWB status    LOWER EXTREMITY ROM:  Active ROM Right eval Left eval  Hip flexion    Hip extension    Hip abduction    Hip adduction    Hip internal rotation    Hip external rotation    Knee flexion    Knee extension     Ankle dorsiflexion  0  Ankle plantarflexion  30  Ankle inversion  30  Ankle eversion  10   (Blank rows = not tested)  LOWER EXTREMITY MMT:  Deferred due to healing   FUNCTIONAL TESTS:  06/08/22: 5 times sit to stand: 9.01 sec Timed up and go (TUG): 11.26 sec    GAIT: Distance walked: 100 ft Assistive device utilized: Crutches Level of assistance: SBA Comments: antalgic, in boot   TODAY'S TREATMENT:         DATE: 06/10/22 Nustep for ROM x 6 min- PT present to discuss progress Seated Baps: Level 2x 1 min each- assist by PT as needed Yellow theraband: ankle 4 ways x 20 each-added to HEP Hip stretches: seated hamstring, supine and seated figure 4, knee to chest 3x20 seconds  Manual: scar mobs, P/ROM and elongation to Lt ankle                                                                                                                   DATE: 06/08/22 Nustep for ROM x 6 min Seated heel slides x 20 Seated heel and toe raises x 20 each Completed TUG and 5 times sit to stand as well as FOTO Standing weight shift side to side then fwd and back x 10 each LE Standing at counter, walking fwd and reverse x 3 laps Side stepping at counter x 3 laps Tapping right foot on/off balance pad fwd x 20 then lateral x 20 Gait training without a.d. in boot x 100 feet, focusing on step through gait  DATE: 06/04/22 Initial eval completed and initiated HEP Gait training with single axillary crutch in boot    PATIENT EDUCATION:  Education details: Instructed in protocol and how to progress weight bearing, gait training with single axillary crutch, initiated HEP Person educated: Patient Education method: Explanation, Demonstration, Verbal cues, and Handouts Education comprehension: verbalized understanding, returned demonstration, and verbal cues required  HOME EXERCISE PROGRAM: Access Code: [email or web address] URL: [email or web address] Date: 06/10/2022 Prepared by:  Claiborne Billings  Exercises - Supine Single Leg Ankle Pumps  - 1 x daily - 7 x weekly - 3 sets - 10 reps - Seated Ankle Alphabet  - 1 x daily - 7 x weekly - 3 sets - 10 reps - Seated Ankle Dorsiflexion Stretch  - 1 x daily - 7 x weekly - 3 sets - 10 reps - Seated Toe Raise  - 1 x daily - 7 x weekly - 3 sets -  10 reps - Seated Hamstring Stretch  - 1 x daily - 7 x weekly - 3 sets - 10 reps - Supine Piriformis Stretch  - 1 x daily - 7 x weekly - 3 sets - 10 reps - Seated Figure 4 Piriformis Stretch  - 1 x daily - 7 x weekly - 3 sets - 10 reps - Long Sitting Ankle Eversion with Resistance  - 2 x daily - 7 x weekly - 2 sets - 10 reps -   hold - Long Sitting Ankle Inversion with Resistance  - 2 x daily - 7 x weekly - 2 sets - 10 reps - Long Sitting Ankle Plantar Flexion with Resistance  - 2 x daily - 7 x weekly - 2 sets - 10 reps - Long Sitting Ankle Dorsiflexion with Anchored Resistance  - 2 x daily - 7 x weekly - 2 sets - 10 reps  ASSESSMENT:  CLINICAL IMPRESSION: Pt arrived with complaints of Lt hip pain today due to altered gait pattern and CAM boot.  PT educated pt on shoe accessory to elevate her Rt shoe.  PT instructed in hip flexibility and ankle strength exercises with band.  Pt will stretch her hip to help to reduce pain.  Pt with good Lt ankle P/ROM and demonstrated good control of ankle with Baps board and ankle theraband exercises.  Pt will continue to work on gait with 1 crutch in the boot.  Patient will benefit from skilled PT to address the below impairments and improve overall function.   OBJECTIVE IMPAIRMENTS: Abnormal gait, decreased balance, decreased mobility, difficulty walking, decreased ROM, decreased strength, increased fascial restrictions, increased muscle spasms, impaired flexibility, impaired sensation, and pain.   ACTIVITY LIMITATIONS: carrying, lifting, bending, standing, squatting, stairs, transfers, toileting, dressing, and caring for others  PARTICIPATION LIMITATIONS: meal  prep, cleaning, laundry, driving, shopping, community activity, occupation, and yard work  PERSONAL FACTORS: 1 comorbidity: recent lumpectomy  are also affecting patient's functional outcome.   REHAB POTENTIAL: Excellent  CLINICAL DECISION MAKING: Stable/uncomplicated  EVALUATION COMPLEXITY: Low   GOALS: Goals reviewed with patient? Yes  SHORT TERM GOALS: Target date: 07/02/2022   Patient will be independent with initial HEP  Baseline: Goal status: INITIAL  2.  Pain report to be no greater than 4/10  Baseline:  Goal status: INITIAL  3.  Patient to be able to ambulate in boot without crutches safely Baseline:  Goal status: INITIAL  4.  Ankle ROM to improve by 3-5 degrees in each plane of motion Baseline:  Goal status: INITIAL  5.  Patient to be able to wean from boot at home Baseline:  Goal status: INITIAL    LONG TERM GOALS: Target date: 07/30/2022    Patient to be independent with advanced HEP  Baseline:  Goal status: INITIAL  2.  Patient to report pain no greater than 2/10  Baseline:  Goal status: INITIAL  3.  Patient to be able to demonstrate normal heel to toe progression in full WB out of boot Baseline:  Goal status: INITIAL  4.  5 times sit to stand to be less than 10 sec Baseline:  Goal status: INITIAL  5.  TUG score to be less than 10 sec Baseline:  Goal status: INITIAL  6.  FOTO score to be at or above predicted score Baseline:  Goal status: INITIAL   PLAN:  PT FREQUENCY: 1-2x/week  PT DURATION: 8 weeks  PLANNED INTERVENTIONS: Therapeutic exercises, Therapeutic activity, Neuromuscular re-education, Balance training, Gait training, Patient/Family education,  Self Care, Joint mobilization, Stair training, Orthotic/Fit training, DME instructions, Aquatic Therapy, Dry Needling, Electrical stimulation, Cryotherapy, Moist heat, Compression bandaging, scar mobilization, Splintting, Taping, Vasopneumatic device, Ionotophoresis '4mg'$ /ml Dexamethasone,  Manual therapy, and Re-evaluation  PLAN FOR NEXT SESSION:  NuSTep, ankle active and passive ROM, progress to Full WB in boot without crutches.     Sigurd Sos, PT 06/10/22 12:54 PM  [facility] [address], [place], [place] [address] Phone # [phone] Fax [phone]

## 2022-06-15 ENCOUNTER — Ambulatory Visit: Payer: BC Managed Care – PPO

## 2022-06-15 DIAGNOSIS — M25672 Stiffness of left ankle, not elsewhere classified: Secondary | ICD-10-CM | POA: Diagnosis not present

## 2022-06-15 DIAGNOSIS — R252 Cramp and spasm: Secondary | ICD-10-CM

## 2022-06-15 DIAGNOSIS — R262 Difficulty in walking, not elsewhere classified: Secondary | ICD-10-CM

## 2022-06-15 DIAGNOSIS — M25572 Pain in left ankle and joints of left foot: Secondary | ICD-10-CM

## 2022-06-15 DIAGNOSIS — M6281 Muscle weakness (generalized): Secondary | ICD-10-CM | POA: Diagnosis not present

## 2022-06-15 NOTE — Therapy (Signed)
OUTPATIENT PHYSICAL THERAPY LOWER EXTREMITY TREATMENT NOTE   Patient Name: Crystal Mcknight MRN: [id number] DOB:1975/09/24, 47 y.o., female Today's Date: 06/15/2022  END OF SESSION:  PT End of Session - 06/15/22 1209     Visit Number 4    Date for PT Re-Evaluation 07/30/22    Authorization Type [employer]    PT Start Time 1145    PT Stop Time 1238    PT Time Calculation (min) 53 min    Activity Tolerance Patient tolerated treatment well    Behavior During Therapy [facility] for tasks assessed/performed              Past Medical History:  Diagnosis Date   Arthritis    hands,pt denies 11/11/21   Depression    Hypothyroidism    Pelvic pain    PONV (postoperative nausea and vomiting)    Substance abuse ([place])    ETOH,RECOVERY FOR 4 YEARS UPDATED 11/11/21   SVD (spontaneous vaginal delivery)    x 2   Thyroid disease    Past Surgical History:  Procedure Laterality Date   APPENDECTOMY     BREAST LUMPECTOMY WITH RADIOACTIVE SEED LOCALIZATION Left 03/25/2022   Procedure: LEFT BREAST LUMPECTOMY WITH RADIOACTIVE SEED LOCALIZATION;  Surgeon: Coralie Keens, MD;  Location: [place];  Service: General;  Laterality: Left;   LAPAROSCOPY N/A 05/31/2013   Procedure: LAPAROSCOPY OPERATIVE, FULGURATION OF ENDOMETRIOSIS, REMOVAL OF BILATERAL FALLOPIAN TUBE CYST;  Surgeon: Marylynn Pearson, MD;  Location: [facility];  Service: Gynecology;  Laterality: N/A;   RHINOPLASTY     WISDOM TOOTH EXTRACTION     Patient Active Problem List   Diagnosis Date Noted   Hypothyroidism 06/05/2018   PTSD (post-traumatic stress disorder) 06/05/2018   Clinodactyly 09/28/2012   Degenerative arthritis of finger 09/28/2012    PCP: Velna Hatchet, MD   REFERRING PROVIDER: Renette Butters, MD  REFERRING DIAG: S82.892 left ankle fracture with ORIF  THERAPY DIAG:  Stiffness of left ankle, not elsewhere classified  Pain in left ankle and joints of left foot  Difficulty in walking,  not elsewhere classified  Muscle weakness (generalized)  Cramp and spasm  Rationale for Evaluation and Treatment: Rehabilitation  ONSET DATE: 06/04/2022  SUBJECTIVE:   SUBJECTIVE STATEMENT: I'm doing more walking around the house without the crutch.  My hips are a lot less sore and painful since adding the shoe lift.       PERTINENT HISTORY: Recent lumpectomy PAIN:  Are you having pain? Yes: NPRS scale: mild at rest but sharp, shooting with weight bearing 0/10 Pain location: left ankle Pain description: sharp shooting Aggravating factors: weight bearing Relieving factors: rest, limiting wb  PRECAUTIONS: Other: currently 6 weeks post op, ease into weight bearing in boot for now ,then wean from boot by 8 weeks.  WEIGHT BEARING RESTRICTIONS: No  FALLS:  Has patient fallen in last 6 months? Yes. Number of falls 1  LIVING ENVIRONMENT: Lives with: lives with their family and lives with their spouse Lives in: House/apartment Stairs: Yes: Internal: multiple steps; rails and External: yes steps; yes Has following equipment at home: Crutches  OCCUPATION: nurse  PLOF: Independent, Independent with basic ADLs, Independent with household mobility without device, Independent with community mobility without device, Independent with homemaking with ambulation, Independent with gait, and Independent with transfers  PATIENT GOALS: To resume her usual fitness level and be able to be active again, would like to snow ski at end of March.   NEXT MD VISIT:  OBJECTIVE:   DIAGNOSTIC FINDINGS: na  PATIENT SURVEYS:  FOTO complete next visit  COGNITION: Overall cognitive status: Within functional limits for tasks assessed     SENSATION: Decreased proprioception due to casted and in boot with NWB status    LOWER EXTREMITY ROM:  Active ROM Right eval Left eval  Hip flexion    Hip extension    Hip abduction    Hip adduction    Hip internal rotation    Hip external rotation     Knee flexion    Knee extension    Ankle dorsiflexion  0  Ankle plantarflexion  30  Ankle inversion  30  Ankle eversion  10   (Blank rows = not tested)  LOWER EXTREMITY MMT:  Deferred due to healing   FUNCTIONAL TESTS:  06/08/22: 5 times sit to stand: 9.01 sec Timed up and go (TUG): 11.26 sec    GAIT: Distance walked: 100 ft Assistive device utilized: Crutches Level of assistance: SBA Comments: antalgic, in boot   TODAY'S TREATMENT:         DATE: 06/15/22 Nustep for ROM x 6 min- PT present to discuss progress Seated heel slides for DF stretch x 10 Seated rocker board x 2 min Seated half roll inv/eversion x 2 min Seated Baps: Level 2x 1 min each- assist by PT as needed 5 feet walk without boot to stretch gastroc and soleus  Gastroc stretch 3 x 30 sec Soleus stretch 3 x 30 sec Toe and heel raises x 20 each Yellow theraband: ankle 4 ways x 20 each-added to HEP  Manual: scar mobs, P/ROM and elongation to Lt ankle  Game ready to Left ankle x 10 min.    DATE: 06/10/22 Nustep for ROM x 6 min- PT present to discuss progress Seated Baps: Level 2x 1 min each- assist by PT as needed Yellow theraband: ankle 4 ways x 20 each-added to HEP Hip stretches: seated hamstring, supine and seated figure 4, knee to chest 3x20 seconds  Manual: scar mobs, P/ROM and elongation to Lt ankle                                                                                                                   DATE: 06/08/22 Nustep for ROM x 6 min Seated heel slides x 20 Seated heel and toe raises x 20 each Completed TUG and 5 times sit to stand as well as FOTO Standing weight shift side to side then fwd and back x 10 each LE Standing at counter, walking fwd and reverse x 3 laps Side stepping at counter x 3 laps Tapping right foot on/off balance pad fwd x 20 then lateral x 20 Gait training without a.d. in boot x 100 feet, focusing on step through gait  DATE: 06/04/22 Initial eval completed and  initiated HEP Gait training with single axillary crutch in boot    PATIENT EDUCATION:  Education details: Instructed in protocol and how to progress weight bearing, gait training with single axillary crutch, initiated HEP  Person educated: Patient Education method: Explanation, Demonstration, Verbal cues, and Handouts Education comprehension: verbalized understanding, returned demonstration, and verbal cues required  HOME EXERCISE PROGRAM: Access Code: [email or web address] URL: [email or web address] Date: 06/10/2022 Prepared by: Claiborne Billings  Exercises - Supine Single Leg Ankle Pumps  - 1 x daily - 7 x weekly - 3 sets - 10 reps - Seated Ankle Alphabet  - 1 x daily - 7 x weekly - 3 sets - 10 reps - Seated Ankle Dorsiflexion Stretch  - 1 x daily - 7 x weekly - 3 sets - 10 reps - Seated Toe Raise  - 1 x daily - 7 x weekly - 3 sets - 10 reps - Seated Hamstring Stretch  - 1 x daily - 7 x weekly - 3 sets - 10 reps - Supine Piriformis Stretch  - 1 x daily - 7 x weekly - 3 sets - 10 reps - Seated Figure 4 Piriformis Stretch  - 1 x daily - 7 x weekly - 3 sets - 10 reps - Long Sitting Ankle Eversion with Resistance  - 2 x daily - 7 x weekly - 2 sets - 10 reps -   hold - Long Sitting Ankle Inversion with Resistance  - 2 x daily - 7 x weekly - 2 sets - 10 reps - Long Sitting Ankle Plantar Flexion with Resistance  - 2 x daily - 7 x weekly - 2 sets - 10 reps - Long Sitting Ankle Dorsiflexion with Anchored Resistance  - 2 x daily - 7 x weekly - 2 sets - 10 reps  ASSESSMENT:  CLINICAL IMPRESSION: Pt with continued improvement with Lt ankle P/ROM.  We had her take a few steps without to boot (5 steps) and she was able to do this comfortably.  Pt will continue to work on gait with 1 crutch in the boot and no crutch at home in boot.  Patient will benefit from skilled PT to address the below impairments and improve overall function.   OBJECTIVE IMPAIRMENTS: Abnormal gait, decreased balance, decreased  mobility, difficulty walking, decreased ROM, decreased strength, increased fascial restrictions, increased muscle spasms, impaired flexibility, impaired sensation, and pain.   ACTIVITY LIMITATIONS: carrying, lifting, bending, standing, squatting, stairs, transfers, toileting, dressing, and caring for others  PARTICIPATION LIMITATIONS: meal prep, cleaning, laundry, driving, shopping, community activity, occupation, and yard work  PERSONAL FACTORS: 1 comorbidity: recent lumpectomy  are also affecting patient's functional outcome.   REHAB POTENTIAL: Excellent  CLINICAL DECISION MAKING: Stable/uncomplicated  EVALUATION COMPLEXITY: Low   GOALS: Goals reviewed with patient? Yes  SHORT TERM GOALS: Target date: 07/02/2022   Patient will be independent with initial HEP  Baseline: Goal status: INITIAL  2.  Pain report to be no greater than 4/10  Baseline:  Goal status: INITIAL  3.  Patient to be able to ambulate in boot without crutches safely Baseline:  Goal status: INITIAL  4.  Ankle ROM to improve by 3-5 degrees in each plane of motion Baseline:  Goal status: INITIAL  5.  Patient to be able to wean from boot at home Baseline:  Goal status: INITIAL    LONG TERM GOALS: Target date: 07/30/2022    Patient to be independent with advanced HEP  Baseline:  Goal status: INITIAL  2.  Patient to report pain no greater than 2/10  Baseline:  Goal status: INITIAL  3.  Patient to be able to demonstrate normal heel to toe progression in full WB out of boot Baseline:  Goal status: INITIAL  4.  5 times sit to stand to be less than 10 sec Baseline:  Goal status: INITIAL  5.  TUG score to be less than 10 sec Baseline:  Goal status: INITIAL  6.  FOTO score to be at or above predicted score Baseline:  Goal status: INITIAL   PLAN:  PT FREQUENCY: 1-2x/week  PT DURATION: 8 weeks  PLANNED INTERVENTIONS: Therapeutic exercises, Therapeutic activity, Neuromuscular re-education,  Balance training, Gait training, Patient/Family education, Self Care, Joint mobilization, Stair training, Orthotic/Fit training, DME instructions, Aquatic Therapy, Dry Needling, Electrical stimulation, Cryotherapy, Moist heat, Compression bandaging, scar mobilization, Splintting, Taping, Vasopneumatic device, Ionotophoresis '4mg'$ /ml Dexamethasone, Manual therapy, and Re-evaluation  PLAN FOR NEXT SESSION:  NuSTep, ankle active and passive ROM, progress to Full WB in boot without crutches.     Anderson Malta B. Shirell Struthers, PT 06/15/22 10:33 PM  [facility] [address] [place], [place] [address] Phone # [phone] Fax [phone]

## 2022-06-17 ENCOUNTER — Ambulatory Visit: Payer: BC Managed Care – PPO

## 2022-06-17 DIAGNOSIS — M25572 Pain in left ankle and joints of left foot: Secondary | ICD-10-CM

## 2022-06-17 DIAGNOSIS — M25672 Stiffness of left ankle, not elsewhere classified: Secondary | ICD-10-CM | POA: Diagnosis not present

## 2022-06-17 DIAGNOSIS — R262 Difficulty in walking, not elsewhere classified: Secondary | ICD-10-CM

## 2022-06-17 DIAGNOSIS — M6281 Muscle weakness (generalized): Secondary | ICD-10-CM

## 2022-06-17 NOTE — Therapy (Signed)
OUTPATIENT PHYSICAL THERAPY LOWER EXTREMITY TREATMENT NOTE   Patient Name: Crystal Mcknight MRN: [id number] DOB:March 19, 1976, 47 y.o., female Today's Date: 06/17/2022  END OF SESSION:  PT End of Session - 06/17/22 1150     Visit Number 5    Date for PT Re-Evaluation 07/30/22    Authorization Type [employer]    Progress Note Due on Visit 10    PT Start Time 1102    PT Stop Time 1158    PT Time Calculation (min) 56 min    Activity Tolerance Patient tolerated treatment well    Behavior During Therapy [facility] for tasks assessed/performed               Past Medical History:  Diagnosis Date   Arthritis    hands,pt denies 11/11/21   Depression    Hypothyroidism    Pelvic pain    PONV (postoperative nausea and vomiting)    Substance abuse ([place])    ETOH,RECOVERY FOR 4 YEARS UPDATED 11/11/21   SVD (spontaneous vaginal delivery)    x 2   Thyroid disease    Past Surgical History:  Procedure Laterality Date   APPENDECTOMY     BREAST LUMPECTOMY WITH RADIOACTIVE SEED LOCALIZATION Left 03/25/2022   Procedure: LEFT BREAST LUMPECTOMY WITH RADIOACTIVE SEED LOCALIZATION;  Surgeon: Coralie Keens, MD;  Location: [place];  Service: General;  Laterality: Left;   LAPAROSCOPY N/A 05/31/2013   Procedure: LAPAROSCOPY OPERATIVE, FULGURATION OF ENDOMETRIOSIS, REMOVAL OF BILATERAL FALLOPIAN TUBE CYST;  Surgeon: Marylynn Pearson, MD;  Location: [facility];  Service: Gynecology;  Laterality: N/A;   RHINOPLASTY     WISDOM TOOTH EXTRACTION     Patient Active Problem List   Diagnosis Date Noted   Hypothyroidism 06/05/2018   PTSD (post-traumatic stress disorder) 06/05/2018   Clinodactyly 09/28/2012   Degenerative arthritis of finger 09/28/2012    PCP: Velna Hatchet, MD   REFERRING PROVIDER: Renette Butters, MD  REFERRING DIAG: S82.892 left ankle fracture with ORIF  THERAPY DIAG:  Stiffness of left ankle, not elsewhere classified  Pain in left ankle and joints of  left foot  Difficulty in walking, not elsewhere classified  Muscle weakness (generalized)  Rationale for Evaluation and Treatment: Rehabilitation  ONSET DATE: 06/04/2022  SUBJECTIVE:   SUBJECTIVE STATEMENT: I'm ready to walk without the boot.      PERTINENT HISTORY: Recent lumpectomy PAIN:  Are you having pain? Yes: NPRS scale: mild at rest but sharp, shooting with weight bearing 0/10 Pain location: left ankle Pain description: sharp shooting Aggravating factors: weight bearing Relieving factors: rest, limiting wb  PRECAUTIONS: Other: currently 6 weeks post op, ease into weight bearing in boot for now ,then wean from boot by 8 weeks.  WEIGHT BEARING RESTRICTIONS: No  FALLS:  Has patient fallen in last 6 months? Yes. Number of falls 1  LIVING ENVIRONMENT: Lives with: lives with their family and lives with their spouse Lives in: House/apartment Stairs: Yes: Internal: multiple steps; rails and External: yes steps; yes Has following equipment at home: Crutches  OCCUPATION: nurse  PLOF: Independent, Independent with basic ADLs, Independent with household mobility without device, Independent with community mobility without device, Independent with homemaking with ambulation, Independent with gait, and Independent with transfers  PATIENT GOALS: To resume her usual fitness level and be able to be active again, would like to snow ski at end of March.    OBJECTIVE:   DIAGNOSTIC FINDINGS: na  PATIENT SURVEYS:  FOTO complete next visit  COGNITION:  Overall cognitive status: Within functional limits for tasks assessed     SENSATION: Decreased proprioception due to casted and in boot with NWB status    LOWER EXTREMITY ROM:  Active ROM Right eval Left eval  Hip flexion    Hip extension    Hip abduction    Hip adduction    Hip internal rotation    Hip external rotation    Knee flexion    Knee extension    Ankle dorsiflexion  0  Ankle plantarflexion  30  Ankle  inversion  30  Ankle eversion  10   (Blank rows = not tested)  LOWER EXTREMITY MMT:  Deferred due to healing   FUNCTIONAL TESTS:  06/08/22: 5 times sit to stand: 9.01 sec Timed up and go (TUG): 11.26 sec    GAIT: Distance walked: 100 ft Assistive device utilized: Crutches Level of assistance: SBA Comments: antalgic, in boot   TODAY'S TREATMENT:    DATE: 06/17/22 Nustep for ROM x 6 min- PT present to discuss progress Seated heel slides for DF stretch x 10 Red theraband: ankle 4 ways x 20 each Seated Baps: Level 2x 1 min each- assist by PT as needed Gait/neuro re-ed:  Standing weight shift each direction  Standing heel raises: 2x10 Gait without boot using crutch with verbal cueing for trunk alignment, knee position with stance and heel strike on Lt Game ready to Left ankle x 10 min.         DATE: 06/15/22 Nustep for ROM x 6 min- PT present to discuss progress Seated heel slides for DF stretch x 10 Seated rocker board x 2 min Seated half roll inv/eversion x 2 min Seated Baps: Level 2x 1 min each- assist by PT as needed 5 feet walk without boot to stretch gastroc and soleus  Gastroc stretch 3 x 30 sec Soleus stretch 3 x 30 sec Toe and heel raises x 20 each Yellow theraband: ankle 4 ways x 20 each-added to HEP  Manual: scar mobs, P/ROM and elongation to Lt ankle  Game ready to Left ankle x 10 min.    DATE: 06/10/22 Nustep for ROM x 6 min- PT present to discuss progress Seated Baps: Level 2x 1 min each- assist by PT as needed Yellow theraband: ankle 4 ways x 20 each-added to HEP Hip stretches: seated hamstring, supine and seated figure 4, knee to chest 3x20 seconds  Manual: scar mobs, P/ROM and elongation to Lt ankle                                                                                                                   DATE: 06/08/22 Nustep for ROM x 6 min Seated heel slides x 20 Seated heel and toe raises x 20 each Completed TUG and 5 times sit to stand as  well as FOTO Standing weight shift side to side then fwd and back x 10 each LE Standing at counter, walking fwd and reverse x 3 laps Side stepping at counter x 3 laps  Tapping right foot on/off balance pad fwd x 20 then lateral x 20 Gait training without a.d. in boot x 100 feet, focusing on step through gait   PATIENT EDUCATION:  Education details: Instructed in protocol and how to progress weight bearing, gait training with single axillary crutch, initiated HEP Person educated: Patient Education method: Explanation, Demonstration, Verbal cues, and Handouts Education comprehension: verbalized understanding, returned demonstration, and verbal cues required  HOME EXERCISE PROGRAM: Access Code: [email or web address] URL: [email or web address] Date: 06/10/2022 Prepared by: Claiborne Billings  Exercises - Supine Single Leg Ankle Pumps  - 1 x daily - 7 x weekly - 3 sets - 10 reps - Seated Ankle Alphabet  - 1 x daily - 7 x weekly - 3 sets - 10 reps - Seated Ankle Dorsiflexion Stretch  - 1 x daily - 7 x weekly - 3 sets - 10 reps - Seated Toe Raise  - 1 x daily - 7 x weekly - 3 sets - 10 reps - Seated Hamstring Stretch  - 1 x daily - 7 x weekly - 3 sets - 10 reps - Supine Piriformis Stretch  - 1 x daily - 7 x weekly - 3 sets - 10 reps - Seated Figure 4 Piriformis Stretch  - 1 x daily - 7 x weekly - 3 sets - 10 reps - Long Sitting Ankle Eversion with Resistance  - 2 x daily - 7 x weekly - 2 sets - 10 reps -   hold - Long Sitting Ankle Inversion with Resistance  - 2 x daily - 7 x weekly - 2 sets - 10 reps - Long Sitting Ankle Plantar Flexion with Resistance  - 2 x daily - 7 x weekly - 2 sets - 10 reps - Long Sitting Ankle Dorsiflexion with Anchored Resistance  - 2 x daily - 7 x weekly - 2 sets - 10 reps  ASSESSMENT:  CLINICAL IMPRESSION: Pt did very well with first time out of the boot.  PT provided verbal and demo cueing to reduce trunk extension, improve symmetry and heel strike on the Lt.  Pt denies  any ankle pain and demonstrated good symmetry with this.  PT educated on weaning from boot and putting this back on when fatigued, painful or outside of the house for now.   Patient will benefit from skilled PT to address the below impairments and improve overall function.   OBJECTIVE IMPAIRMENTS: Abnormal gait, decreased balance, decreased mobility, difficulty walking, decreased ROM, decreased strength, increased fascial restrictions, increased muscle spasms, impaired flexibility, impaired sensation, and pain.   ACTIVITY LIMITATIONS: carrying, lifting, bending, standing, squatting, stairs, transfers, toileting, dressing, and caring for others  PARTICIPATION LIMITATIONS: meal prep, cleaning, laundry, driving, shopping, community activity, occupation, and yard work  PERSONAL FACTORS: 1 comorbidity: recent lumpectomy  are also affecting patient's functional outcome.   REHAB POTENTIAL: Excellent  CLINICAL DECISION MAKING: Stable/uncomplicated  EVALUATION COMPLEXITY: Low   GOALS: Goals reviewed with patient? Yes  SHORT TERM GOALS: Target date: 07/02/2022   Patient will be independent with initial HEP  Baseline: Goal status: MET  2.  Pain report to be no greater than 4/10  Baseline:  Goal status: INITIAL  3.  Patient to be able to ambulate in boot without crutches safely Baseline: ambulating with boot and crutches without difficulty Goal status: MET  4.  Ankle ROM to improve by 3-5 degrees in each plane of motion Baseline:  Goal status: INITIAL  5.  Patient to be able to wean from boot at home Baseline:  started working on this today (06/17/22) Goal status: In progress    LONG TERM GOALS: Target date: 07/30/2022    Patient to be independent with advanced HEP  Baseline:  Goal status: INITIAL  2.  Patient to report pain no greater than 2/10  Baseline:  Goal status: INITIAL  3.  Patient to be able to demonstrate normal heel to toe progression in full WB out of boot Baseline:   Goal status: INITIAL  4.  5 times sit to stand to be less than 10 sec Baseline:  Goal status: INITIAL  5.  TUG score to be less than 10 sec Baseline:  Goal status: INITIAL  6.  FOTO score to be at or above predicted score Baseline:  Goal status: INITIAL   PLAN:  PT FREQUENCY: 1-2x/week  PT DURATION: 8 weeks  PLANNED INTERVENTIONS: Therapeutic exercises, Therapeutic activity, Neuromuscular re-education, Balance training, Gait training, Patient/Family education, Self Care, Joint mobilization, Stair training, Orthotic/Fit training, DME instructions, Aquatic Therapy, Dry Needling, Electrical stimulation, Cryotherapy, Moist heat, Compression bandaging, scar mobilization, Splintting, Taping, Vasopneumatic device, Ionotophoresis '4mg'$ /ml Dexamethasone, Manual therapy, and Re-evaluation  PLAN FOR NEXT SESSION:  NuStep, ankle active and passive ROM, Continue to work on gait without boot   Sigurd Sos, PT 06/17/22 12:29 PM    [facility] [address] [place], [place] [address] Phone # [phone] Fax [phone]

## 2022-06-21 ENCOUNTER — Ambulatory Visit: Payer: BC Managed Care – PPO

## 2022-06-21 DIAGNOSIS — M25672 Stiffness of left ankle, not elsewhere classified: Secondary | ICD-10-CM

## 2022-06-21 DIAGNOSIS — M25572 Pain in left ankle and joints of left foot: Secondary | ICD-10-CM

## 2022-06-21 DIAGNOSIS — M6281 Muscle weakness (generalized): Secondary | ICD-10-CM | POA: Diagnosis not present

## 2022-06-21 DIAGNOSIS — R262 Difficulty in walking, not elsewhere classified: Secondary | ICD-10-CM | POA: Diagnosis not present

## 2022-06-21 NOTE — Therapy (Signed)
OUTPATIENT PHYSICAL THERAPY LOWER EXTREMITY TREATMENT NOTE   Patient Name: Crystal Mcknight MRN: [id number] DOB:10-31-1975, 47 y.o., female Today's Date: 06/21/2022  END OF SESSION:  PT End of Session - 06/21/22 1147     Visit Number 6    Date for PT Re-Evaluation 07/30/22    Authorization Type BL[employer]    PT Start Time 1105    PT Stop Time 1156    PT Time Calculation (min) 51 min    Activity Tolerance Patient tolerated treatment well    Behavior During Therapy [facility] for tasks assessed/performed                Past Medical History:  Diagnosis Date   Arthritis    hands,pt denies 11/11/21   Depression    Hypothyroidism    Pelvic pain    PONV (postoperative nausea and vomiting)    Substance abuse ([place])    ETOH,RECOVERY FOR 4 YEARS UPDATED 11/11/21   SVD (spontaneous vaginal delivery)    x 2   Thyroid disease    Past Surgical History:  Procedure Laterality Date   APPENDECTOMY     BREAST LUMPECTOMY WITH RADIOACTIVE SEED LOCALIZATION Left 03/25/2022   Procedure: LEFT BREAST LUMPECTOMY WITH RADIOACTIVE SEED LOCALIZATION;  Surgeon: Coralie Keens, MD;  Location: [facility];  Service: General;  Laterality: Left;   LAPAROSCOPY N/A 05/31/2013   Procedure: LAPAROSCOPY OPERATIVE, FULGURATION OF ENDOMETRIOSIS, REMOVAL OF BILATERAL FALLOPIAN TUBE CYST;  Surgeon: Marylynn Pearson, MD;  Location: [facility];  Service: Gynecology;  Laterality: N/A;   RHINOPLASTY     WISDOM TOOTH EXTRACTION     Patient Active Problem List   Diagnosis Date Noted   Hypothyroidism 06/05/2018   PTSD (post-traumatic stress disorder) 06/05/2018   Clinodactyly 09/28/2012   Degenerative arthritis of finger 09/28/2012    PCP: Velna Hatchet, MD   REFERRING PROVIDER: Renette Butters, MD  REFERRING DIAG: S82.892 left ankle fracture with ORIF  THERAPY DIAG:  Stiffness of left ankle, not elsewhere classified  Pain in left ankle and joints of left foot  Difficulty in  walking, not elsewhere classified  Muscle weakness (generalized)  Rationale for Evaluation and Treatment: Rehabilitation  ONSET DATE: 06/04/2022  SUBJECTIVE:   SUBJECTIVE STATEMENT: I've practiced walking without the boot.  My legs has been sore from new activity.  Some increased swelling.      PERTINENT HISTORY: Recent lumpectomy PAIN:  Are you having pain? Yes: NPRS scale: mild at rest but sharp, shooting with weight bearing 0/10 Pain location: left ankle Pain description: sharp shooting Aggravating factors: weight bearing Relieving factors: rest, limiting wb  PRECAUTIONS: Other: currently 6 weeks post op, ease into weight bearing in boot for now ,then wean from boot by 8 weeks.  WEIGHT BEARING RESTRICTIONS: No  FALLS:  Has patient fallen in last 6 months? Yes. Number of falls 1  LIVING ENVIRONMENT: Lives with: lives with their family and lives with their spouse Lives in: House/apartment Stairs: Yes: Internal: multiple steps; rails and External: yes steps; yes Has following equipment at home: Crutches  OCCUPATION: nurse  PLOF: Independent, Independent with basic ADLs, Independent with household mobility without device, Independent with community mobility without device, Independent with homemaking with ambulation, Independent with gait, and Independent with transfers  PATIENT GOALS: To resume her usual fitness level and be able to be active again, would like to snow ski at end of March.    OBJECTIVE:   DIAGNOSTIC FINDINGS: na  PATIENT SURVEYS:  FOTO complete  next visit  COGNITION: Overall cognitive status: Within functional limits for tasks assessed     SENSATION: Decreased proprioception due to casted and in boot with NWB status    LOWER EXTREMITY ROM:  Active ROM Right eval Left eval  Hip flexion    Hip extension    Hip abduction    Hip adduction    Hip internal rotation    Hip external rotation    Knee flexion    Knee extension    Ankle  dorsiflexion  0  Ankle plantarflexion  30  Ankle inversion  30  Ankle eversion  10   (Blank rows = not tested)  LOWER EXTREMITY MMT:  Deferred due to healing   FUNCTIONAL TESTS:  06/08/22: 5 times sit to stand: 9.01 sec Timed up and go (TUG): 11.26 sec    GAIT: Distance walked: 100 ft Assistive device utilized: Crutches Level of assistance: SBA Comments: antalgic, in boot   TODAY'S TREATMENT:    DATE: 06/21/22 Nustep for ROM Level 5x 6 min- legs only- PT present to discuss progress Seated Baps: Level 2x 1 min each- no assistance needed today Gait/neuro re-ed:  Modified single leg stance on Lt with min UE support x5 Forward and sidestepping over hurdles in // bars x4 laps  AirEx balance beam: sidestepping and tandem line walking with UE support as needed Standing heel raises: 2x10 Gait without boot using crutch with verbal cueing for trunk alignment, knee position with stance and heel strike on Lt, in parallel bars with use of mirror for visual feedback  Game ready to Left ankle x 10 min.   DATE: 06/17/22 Nustep for ROM x 6 min- PT present to discuss progress Seated heel slides for DF stretch x 10 Red theraband: ankle 4 ways x 20 each Seated Baps: Level 2x 1 min each- assist by PT as needed Gait/neuro re-ed:  Standing weight shift each direction  Standing heel raises: 2x10 Gait without boot using crutch with verbal cueing for trunk alignment, knee position with stance and heel strike on Lt Game ready to Left ankle x 10 min.         DATE: 06/15/22 Nustep for ROM x 6 min- PT present to discuss progress Seated heel slides for DF stretch x 10 Seated rocker board x 2 min Seated half roll inv/eversion x 2 min Seated Baps: Level 2x 1 min each- assist by PT as needed 5 feet walk without boot to stretch gastroc and soleus  Gastroc stretch 3 x 30 sec Soleus stretch 3 x 30 sec Toe and heel raises x 20 each Yellow theraband: ankle 4 ways x 20 each-added to HEP  Manual: scar  mobs, P/ROM and elongation to Lt ankle  Game ready to Left ankle x 10 min.                        PATIENT EDUCATION:  Education details: Instructed in protocol and how to progress weight bearing, gait training with single axillary crutch, initiated HEP Person educated: Patient Education method: Explanation, Demonstration, Verbal cues, and Handouts Education comprehension: verbalized understanding, returned demonstration, and verbal cues required  HOME EXERCISE PROGRAM: Access Code: [email or web address] URL: [email or web address] Date: 06/10/2022 Prepared by: Claiborne Billings  Exercises - Supine Single Leg Ankle Pumps  - 1 x daily - 7 x weekly - 3 sets - 10 reps - Seated Ankle Alphabet  - 1 x daily - 7 x weekly - 3 sets - 10 reps - Seated Ankle Dorsiflexion  Stretch  - 1 x daily - 7 x weekly - 3 sets - 10 reps - Seated Toe Raise  - 1 x daily - 7 x weekly - 3 sets - 10 reps - Seated Hamstring Stretch  - 1 x daily - 7 x weekly - 3 sets - 10 reps - Supine Piriformis Stretch  - 1 x daily - 7 x weekly - 3 sets - 10 reps - Seated Figure 4 Piriformis Stretch  - 1 x daily - 7 x weekly - 3 sets - 10 reps - Long Sitting Ankle Eversion with Resistance  - 2 x daily - 7 x weekly - 2 sets - 10 reps -   hold - Long Sitting Ankle Inversion with Resistance  - 2 x daily - 7 x weekly - 2 sets - 10 reps - Long Sitting Ankle Plantar Flexion with Resistance  - 2 x daily - 7 x weekly - 2 sets - 10 reps - Long Sitting Ankle Dorsiflexion with Anchored Resistance  - 2 x daily - 7 x weekly - 2 sets - 10 reps  ASSESSMENT:  CLINICAL IMPRESSION: Pt is making progress out of the boot.  She demonstrates improved single limb stability on the Lt with cueing for gluteal activation to level pelvis.  No assistance required for Baps board today. Frequent verbal cueing to reduce trunk extension when using crutch.  She continues to wean out of the boot and home and PT discussed using pain and fatigue as a guide for how to do this.     Patient will benefit from skilled PT to address the below impairments and improve overall function.   OBJECTIVE IMPAIRMENTS: Abnormal gait, decreased balance, decreased mobility, difficulty walking, decreased ROM, decreased strength, increased fascial restrictions, increased muscle spasms, impaired flexibility, impaired sensation, and pain.   ACTIVITY LIMITATIONS: carrying, lifting, bending, standing, squatting, stairs, transfers, toileting, dressing, and caring for others  PARTICIPATION LIMITATIONS: meal prep, cleaning, laundry, driving, shopping, community activity, occupation, and yard work  PERSONAL FACTORS: 1 comorbidity: recent lumpectomy  are also affecting patient's functional outcome.   REHAB POTENTIAL: Excellent  CLINICAL DECISION MAKING: Stable/uncomplicated  EVALUATION COMPLEXITY: Low   GOALS: Goals reviewed with patient? Yes  SHORT TERM GOALS: Target date: 07/02/2022   Patient will be independent with initial HEP  Baseline: Goal status: MET  2.  Pain report to be no greater than 4/10  Baseline: no ankle pain (06/21/22) Goal status: MET  3.  Patient to be able to ambulate in boot without crutches safely Baseline: ambulating with boot and crutches without difficulty Goal status: MET  4.  Ankle ROM to improve by 3-5 degrees in each plane of motion Baseline:  Goal status: INITIAL  5.  Patient to be able to wean from boot at home Baseline: started working on this today (06/17/22) Goal status: In progress    LONG TERM GOALS: Target date: 07/30/2022    Patient to be independent with advanced HEP  Baseline:  Goal status: INITIAL  2.  Patient to report pain no greater than 2/10  Baseline:  Goal status: INITIAL  3.  Patient to be able to demonstrate normal heel to toe progression in full WB out of boot Baseline:  Goal status: INITIAL  4.  5 times sit to stand to be less than 10 sec Baseline:  Goal status: INITIAL  5.  TUG score to be less than 10  sec Baseline:  Goal status: INITIAL  6.  FOTO score to be at or  above predicted score Baseline:  Goal status: INITIAL   PLAN:  PT FREQUENCY: 1-2x/week  PT DURATION: 8 weeks  PLANNED INTERVENTIONS: Therapeutic exercises, Therapeutic activity, Neuromuscular re-education, Balance training, Gait training, Patient/Family education, Self Care, Joint mobilization, Stair training, Orthotic/Fit training, DME instructions, Aquatic Therapy, Dry Needling, Electrical stimulation, Cryotherapy, Moist heat, Compression bandaging, scar mobilization, Splintting, Taping, Vasopneumatic device, Ionotophoresis '4mg'$ /ml Dexamethasone, Manual therapy, and Re-evaluation  PLAN FOR NEXT SESSION:  measure Lt ankle A/ROM, increased Baps to level 3, gait and gluteal activation   Sigurd Sos, PT 06/21/22 11:55 AM    [facility] [address] [place], [place] [address] Phone # [phone] Fax [phone]

## 2022-06-24 ENCOUNTER — Ambulatory Visit (HOSPITAL_BASED_OUTPATIENT_CLINIC_OR_DEPARTMENT_OTHER): Payer: BC Managed Care – PPO | Admitting: Obstetrics & Gynecology

## 2022-06-24 ENCOUNTER — Ambulatory Visit: Payer: BC Managed Care – PPO | Attending: Orthopedic Surgery

## 2022-06-24 ENCOUNTER — Encounter (HOSPITAL_BASED_OUTPATIENT_CLINIC_OR_DEPARTMENT_OTHER): Payer: Self-pay | Admitting: Obstetrics & Gynecology

## 2022-06-24 VITALS — BP 110/64 | HR 70 | Ht 67.0 in | Wt 140.0 lb

## 2022-06-24 DIAGNOSIS — R252 Cramp and spasm: Secondary | ICD-10-CM

## 2022-06-24 DIAGNOSIS — S82402A Unspecified fracture of shaft of left fibula, initial encounter for closed fracture: Secondary | ICD-10-CM

## 2022-06-24 DIAGNOSIS — M6281 Muscle weakness (generalized): Secondary | ICD-10-CM

## 2022-06-24 DIAGNOSIS — Z8742 Personal history of other diseases of the female genital tract: Secondary | ICD-10-CM | POA: Diagnosis not present

## 2022-06-24 DIAGNOSIS — R102 Pelvic and perineal pain: Secondary | ICD-10-CM | POA: Diagnosis not present

## 2022-06-24 DIAGNOSIS — Z803 Family history of malignant neoplasm of breast: Secondary | ICD-10-CM | POA: Diagnosis not present

## 2022-06-24 DIAGNOSIS — R262 Difficulty in walking, not elsewhere classified: Secondary | ICD-10-CM

## 2022-06-24 DIAGNOSIS — S82202A Unspecified fracture of shaft of left tibia, initial encounter for closed fracture: Secondary | ICD-10-CM

## 2022-06-24 DIAGNOSIS — M25672 Stiffness of left ankle, not elsewhere classified: Secondary | ICD-10-CM

## 2022-06-24 DIAGNOSIS — M25572 Pain in left ankle and joints of left foot: Secondary | ICD-10-CM | POA: Diagnosis not present

## 2022-06-24 NOTE — Patient Instructions (Addendum)
[place]ilissa  Non estrogen endometriosis treatment used for decreasing pain    Bone Density:  Call [phone] to scheduled at the [facility], [address], [place]  [place], [place] [address]

## 2022-06-24 NOTE — Therapy (Signed)
OUTPATIENT PHYSICAL THERAPY LOWER EXTREMITY TREATMENT NOTE   Patient Name: Crystal Mcknight MRN: [id number] DOB:Jun 01, 1975, 47 y.o., female Today's Date: 06/24/2022  END OF SESSION:  PT End of Session - 06/24/22 0936     Visit Number 7    Date for PT Re-Evaluation 07/30/22    Authorization Type BL[employer]    PT Start Time 0932    PT Stop Time 1018    PT Time Calculation (min) 46 min    Activity Tolerance Patient tolerated treatment well    Behavior During Therapy [facility] for tasks assessed/performed                Past Medical History:  Diagnosis Date   Arthritis    hands,pt denies 11/11/21   Depression    Hypothyroidism    Pelvic pain    PONV (postoperative nausea and vomiting)    Substance abuse ([place])    ETOH,RECOVERY FOR 4 YEARS UPDATED 11/11/21   SVD (spontaneous vaginal delivery)    x 2   Thyroid disease    Past Surgical History:  Procedure Laterality Date   APPENDECTOMY     BREAST LUMPECTOMY WITH RADIOACTIVE SEED LOCALIZATION Left 03/25/2022   Procedure: LEFT BREAST LUMPECTOMY WITH RADIOACTIVE SEED LOCALIZATION;  Surgeon: Coralie Keens, MD;  Location: [facility];  Service: General;  Laterality: Left;   LAPAROSCOPY N/A 05/31/2013   Procedure: LAPAROSCOPY OPERATIVE, FULGURATION OF ENDOMETRIOSIS, REMOVAL OF BILATERAL FALLOPIAN TUBE CYST;  Surgeon: Marylynn Pearson, MD;  Location: [facility];  Service: Gynecology;  Laterality: N/A;   RHINOPLASTY     WISDOM TOOTH EXTRACTION     Patient Active Problem List   Diagnosis Date Noted   Hypothyroidism 06/05/2018   PTSD (post-traumatic stress disorder) 06/05/2018   Clinodactyly 09/28/2012   Degenerative arthritis of finger 09/28/2012    PCP: Velna Hatchet, MD   REFERRING PROVIDER: Renette Butters, MD  REFERRING DIAG: S82.892 left ankle fracture with ORIF  THERAPY DIAG:  Stiffness of left ankle, not elsewhere classified  Pain in left ankle and joints of left foot  Difficulty in  walking, not elsewhere classified  Muscle weakness (generalized)  Cramp and spasm  Rationale for Evaluation and Treatment: Rehabilitation  ONSET DATE: 06/04/2022  SUBJECTIVE:   SUBJECTIVE STATEMENT: I've been in the shoe a lot at home and not using crutch much at all.  No significant soreness or swelling.        PERTINENT HISTORY: Recent lumpectomy PAIN:  Are you having pain? Yes: NPRS scale: mild at rest but sharp, shooting with weight bearing 0/10 Pain location: left ankle Pain description: sharp shooting Aggravating factors: weight bearing Relieving factors: rest, limiting wb  PRECAUTIONS: Other: currently 6 weeks post op, ease into weight bearing in boot for now ,then wean from boot by 8 weeks.  WEIGHT BEARING RESTRICTIONS: No  FALLS:  Has patient fallen in last 6 months? Yes. Number of falls 1  LIVING ENVIRONMENT: Lives with: lives with their family and lives with their spouse Lives in: House/apartment Stairs: Yes: Internal: multiple steps; rails and External: yes steps; yes Has following equipment at home: Crutches  OCCUPATION: nurse  PLOF: Independent, Independent with basic ADLs, Independent with household mobility without device, Independent with community mobility without device, Independent with homemaking with ambulation, Independent with gait, and Independent with transfers  PATIENT GOALS: To resume her usual fitness level and be able to be active again, would like to snow ski at end of March.    OBJECTIVE:  DIAGNOSTIC FINDINGS: na  PATIENT SURVEYS:  FOTO complete next visit  COGNITION: Overall cognitive status: Within functional limits for tasks assessed     SENSATION: Decreased proprioception due to casted and in boot with NWB status    LOWER EXTREMITY ROM:  Active ROM Right eval Left eval  Hip flexion    Hip extension    Hip abduction    Hip adduction    Hip internal rotation    Hip external rotation    Knee flexion    Knee  extension    Ankle dorsiflexion  0  Ankle plantarflexion  30  Ankle inversion  30  Ankle eversion  10   (Blank rows = not tested)  LOWER EXTREMITY MMT:  Deferred due to healing   FUNCTIONAL TESTS:  06/08/22: 5 times sit to stand: 9.01 sec Timed up and go (TUG): 11.26 sec    GAIT: Distance walked: 100 ft Assistive device utilized: Crutches Level of assistance: SBA Comments: antalgic, in boot   TODAY'S TREATMENT:    DATE: 06/24/22 Gait training without boot and without a.d. 100 feet focusing on normalizing heel to toe progression and normal step length Standing gastroc soleus stretching on edge of multi hip machine.  Then with rocker board propped against platform of multi hip 5 x 10 sec each Heel raises on edge of multi hip machine x 10 Step tap up on foot stool standing on left LE x 20 Lunges fwd x 20 (right foot coming fwd) SLS static x 5 hold 10 sec each Dynamic SLS (cone touch- cone in chair) 3 sets of 10 (floor) Suggested DC boot unless going out for home visit for work, no crutch but be sure to have a shoe on at all times.   Game ready to Left ankle x 10 min.   DATE: 06/21/22 Nustep for ROM Level 5x 6 min- legs only- PT present to discuss progress Seated Baps: Level 2x 1 min each- no assistance needed today Gait/neuro re-ed:  Modified single leg stance on Lt with min UE support x5 Forward and sidestepping over hurdles in // bars x4 laps  AirEx balance beam: sidestepping and tandem line walking with UE support as needed Standing heel raises: 2x10 Gait without boot using crutch with verbal cueing for trunk alignment, knee position with stance and heel strike on Lt, in parallel bars with use of mirror for visual feedback  Game ready to Left ankle x 10 min.   DATE: 06/17/22 Nustep for ROM x 6 min- PT present to discuss progress Seated heel slides for DF stretch x 10 Red theraband: ankle 4 ways x 20 each Seated Baps: Level 2x 1 min each- assist by PT as  needed Gait/neuro re-ed:  Standing weight shift each direction  Standing heel raises: 2x10 Gait without boot using crutch with verbal cueing for trunk alignment, knee position with stance and heel strike on Lt Game ready to Left ankle x 10 min.                              PATIENT EDUCATION:  Education details: Instructed in protocol and how to progress weight bearing, gait training with single axillary crutch, initiated HEP Person educated: Patient Education method: Explanation, Demonstration, Verbal cues, and Handouts Education comprehension: verbalized understanding, returned demonstration, and verbal cues required  HOME EXERCISE PROGRAM: Access Code: [email or web address] URL: [email or web address] Date: 06/10/2022 Prepared by: Claiborne Billings  Exercises - Supine Single Leg Ankle Pumps  -  1 x daily - 7 x weekly - 3 sets - 10 reps - Seated Ankle Alphabet  - 1 x daily - 7 x weekly - 3 sets - 10 reps - Seated Ankle Dorsiflexion Stretch  - 1 x daily - 7 x weekly - 3 sets - 10 reps - Seated Toe Raise  - 1 x daily - 7 x weekly - 3 sets - 10 reps - Seated Hamstring Stretch  - 1 x daily - 7 x weekly - 3 sets - 10 reps - Supine Piriformis Stretch  - 1 x daily - 7 x weekly - 3 sets - 10 reps - Seated Figure 4 Piriformis Stretch  - 1 x daily - 7 x weekly - 3 sets - 10 reps - Long Sitting Ankle Eversion with Resistance  - 2 x daily - 7 x weekly - 2 sets - 10 reps -   hold - Long Sitting Ankle Inversion with Resistance  - 2 x daily - 7 x weekly - 2 sets - 10 reps - Long Sitting Ankle Plantar Flexion with Resistance  - 2 x daily - 7 x weekly - 2 sets - 10 reps - Long Sitting Ankle Dorsiflexion with Anchored Resistance  - 2 x daily - 7 x weekly - 2 sets - 10 reps  ASSESSMENT:  CLINICAL IMPRESSION: Mimi is progressing very well.  She is well motivated and compliant.  She is mostly out of the boot at this point and not using crutch at home much at all.  She is wearing her normal shoe wear at home as  well.  She has gone back to working a few hours per week and wears her boot for home visits and not using crutch for this.  She demonstrates excellent heel to toe progression in normal foot wear today.  She has some minor hip asymmetry during gait but did very well with SLS activity, able to maintain SLS for 10 reps of cone touches without loss of balance or pain for 3 sets.  She should continue to progress with ease.   Patient will benefit from skilled PT to address the below impairments and improve overall function.   OBJECTIVE IMPAIRMENTS: Abnormal gait, decreased balance, decreased mobility, difficulty walking, decreased ROM, decreased strength, increased fascial restrictions, increased muscle spasms, impaired flexibility, impaired sensation, and pain.   ACTIVITY LIMITATIONS: carrying, lifting, bending, standing, squatting, stairs, transfers, toileting, dressing, and caring for others  PARTICIPATION LIMITATIONS: meal prep, cleaning, laundry, driving, shopping, community activity, occupation, and yard work  PERSONAL FACTORS: 1 comorbidity: recent lumpectomy  are also affecting patient's functional outcome.   REHAB POTENTIAL: Excellent  CLINICAL DECISION MAKING: Stable/uncomplicated  EVALUATION COMPLEXITY: Low   GOALS: Goals reviewed with patient? Yes  SHORT TERM GOALS: Target date: 07/02/2022   Patient will be independent with initial HEP  Baseline: Goal status: MET  2.  Pain report to be no greater than 4/10  Baseline: no ankle pain (06/21/22) Goal status: MET  3.  Patient to be able to ambulate in boot without crutches safely Baseline: ambulating with boot and crutches without difficulty Goal status: MET  4.  Ankle ROM to improve by 3-5 degrees in each plane of motion Baseline:  Goal status: INITIAL  5.  Patient to be able to wean from boot at home Baseline: started working on this today (06/17/22) Goal status: In progress    LONG TERM GOALS: Target date: 07/30/2022     Patient to be independent with advanced HEP  Baseline:  Goal status: INITIAL  2.  Patient to report pain no greater than 2/10  Baseline:  Goal status: IN PROGRESS  3.  Patient to be able to demonstrate normal heel to toe progression in full WB out of boot Baseline:  Goal status: IN PROGRESS  4.  5 times sit to stand to be less than 10 sec Baseline:  Goal status: INITIAL  5.  TUG score to be less than 10 sec Baseline:  Goal status: INITIAL  6.  FOTO score to be at or above predicted score Baseline:  Goal status: INITIAL   PLAN:  PT FREQUENCY: 1-2x/week  PT DURATION: 8 weeks  PLANNED INTERVENTIONS: Therapeutic exercises, Therapeutic activity, Neuromuscular re-education, Balance training, Gait training, Patient/Family education, Self Care, Joint mobilization, Stair training, Orthotic/Fit training, DME instructions, Aquatic Therapy, Dry Needling, Electrical stimulation, Cryotherapy, Moist heat, Compression bandaging, scar mobilization, Splintting, Taping, Vasopneumatic device, Ionotophoresis '4mg'$ /ml Dexamethasone, Manual therapy, and Re-evaluation  PLAN FOR NEXT SESSION:  measure Lt ankle A/ROM, progress dynamic SLS activity as tolerated, step ups and downs,  gait and gluteal activation   Rohnan Bartleson B. Aubrei Bouchie, PT 06/24/22 11:45 AM  [facility] [address], [place], [place] [address] Phone # [phone] Fax [phone]

## 2022-06-24 NOTE — Progress Notes (Signed)
GYNECOLOGY  VISIT  CC:   consult for pelvic pain/endometriosis  HPI: 47 y.o. G29P0012 Married White or Caucasian female here for discussion of possible laparoscopy due to hx of endometriosis.  Had surgery 05/31/2013 with laparoscopy and fulguration of endometriosis.  Operative report reviewed.  This was with Dr. Julien Girt who has stopped doing both ob and surgery.  Pt has a friend who sees me and recommended coming for consultation.    Pt reports that pelvic pain has gradually returned and is similar to before when had laparoscopy.  Is worse right before cycle and into cycle.  Feels like she has one good day each month.    Pt has had appt for several months and recently broke tibia/fibula with not really a significant trauma or fall.  Still in a boot.    Family history reviewed.  Maternal grandmother and great aunt had breast cancer.  Pt had abnormal breat MRI done due to increased risk for breast cancer from family hx.  Initial biopsy showed atypical ductal hyperplasia.  Pt then had lumpectomy with Dr. Ninfa Linden 03/25/2022.  Final pathology did not show ADH but only adenosis and utual ductal hyperplasia.    Records did come and she had a normal ultrasound last year.  Had normal pap smear as well.  These will be scanned into EPIC.  H/o alcohol abuse but has not consumed in more than 4 years.    Past Medical History:  Diagnosis Date   Abnormal Pap smear of cervix    Arthritis    hands,pt denies 11/11/21   Depression    Hypothyroidism    Pelvic pain    PONV (postoperative nausea and vomiting)    Substance abuse ([place])    ETOH,RECOVERY FOR 4 YEARS UPDATED 11/11/21   Thyroid disease     MEDS:   Current Outpatient Medications on File Prior to Visit  Medication Sig Dispense Refill   B Complex-C (B-COMPLEX WITH VITAMIN C) tablet B Complex     fluticasone (FLONASE) 50 MCG/ACT nasal spray Place into both nostrils daily.     levothyroxine (SYNTHROID) 150 MCG tablet Take 1 tablet (150 mcg total) by  mouth every other day. 45 tablet 3   levothyroxine (SYNTHROID) 175 MCG tablet TAKE 1 TABLET(175 MCG) BY MOUTH EVERY OTHER DAY 45 tablet 3   Multiple Vitamins-Minerals (MULTIVITAMIN ADULT EXTRA C PO) daily.     sertraline (ZOLOFT) 50 MG tablet Take 50 mg by mouth daily.     traZODone (DESYREL) 50 MG tablet Take at bedtime as directed 90 tablet 1   traMADol (ULTRAM) 50 MG tablet Take 1 tablet (50 mg total) by mouth every 6 (six) hours as needed for moderate pain or severe pain. (Patient not taking: Reported on 06/24/2022) 25 tablet 0   No current facility-administered medications on file prior to visit.    ALLERGIES: Penicillins  SH:  married, non smoker  Review of Systems  Genitourinary:        Pelvic pain    PHYSICAL EXAMINATION:    BP 110/64   Pulse 70   Ht '5\' 7"'$  (1.702 m)   Wt 140 lb (63.5 kg)   BMI 21.93 kg/m     Physical Exam Constitutional:      Appearance: Normal appearance.  Cardiovascular:     Rate and Rhythm: Normal rate.  Pulmonary:     Effort: Pulmonary effort is normal.  Neurological:     General: No focal deficit present.     Mental Status: She is alert.  Psychiatric:        Mood and Affect: Mood normal.     Assessment/Plan: 1. Pelvic pain - given hx, repeat laparoscopy is reasonable.  Pt really wants leg to heal first.  Freida Busman discussed as well as Barbette Merino.  Information provided for pt.    2. History of endometriosis  3. Closed fracture of left tibia and fibula, initial encounter - will order BMD considering severity of fracture - DG BONE DENSITY (DXA); Future  4. Family history of breast cancer - feel pt should have genetic testing as a positive result would really change surgical recommendations. - Ambulatory referral to Genetics   Total time with pt:  28 minutes.  Total time documenting:  10 minutes.  Total time:  38 minutes

## 2022-06-25 ENCOUNTER — Telehealth: Payer: Self-pay | Admitting: Licensed Clinical Social Worker

## 2022-06-25 NOTE — Telephone Encounter (Signed)
Scheduled appt per 2/1 referral. Pt is aware of appt date and time. Pt is aware to arrive 15 mins prior to appt time and to bring and updated insurance card. Pt is aware of appt location.

## 2022-06-27 ENCOUNTER — Encounter (HOSPITAL_BASED_OUTPATIENT_CLINIC_OR_DEPARTMENT_OTHER): Payer: Self-pay | Admitting: Obstetrics & Gynecology

## 2022-06-29 ENCOUNTER — Ambulatory Visit: Payer: BC Managed Care – PPO

## 2022-06-29 DIAGNOSIS — R252 Cramp and spasm: Secondary | ICD-10-CM | POA: Diagnosis not present

## 2022-06-29 DIAGNOSIS — R262 Difficulty in walking, not elsewhere classified: Secondary | ICD-10-CM | POA: Diagnosis not present

## 2022-06-29 DIAGNOSIS — M6281 Muscle weakness (generalized): Secondary | ICD-10-CM

## 2022-06-29 DIAGNOSIS — M25672 Stiffness of left ankle, not elsewhere classified: Secondary | ICD-10-CM

## 2022-06-29 DIAGNOSIS — M25572 Pain in left ankle and joints of left foot: Secondary | ICD-10-CM | POA: Diagnosis not present

## 2022-06-30 NOTE — Therapy (Signed)
OUTPATIENT PHYSICAL THERAPY LOWER EXTREMITY TREATMENT NOTE   Patient Name: Crystal Mcknight MRN: [id number] DOB:24-Sep-1975, 47 y.o., female Today's Date: 06/30/2022  END OF SESSION:  PT End of Session - 06/29/22 1231     Visit Number 8    Date for PT Re-Evaluation 07/30/22    Authorization Type [employer]    Progress Note Due on Visit 10    PT Start Time 1230    PT Stop Time 1315    PT Time Calculation (min) 45 min    Activity Tolerance Patient tolerated treatment well    Behavior During Therapy [facility] for tasks assessed/performed                Past Medical History:  Diagnosis Date   Abnormal Pap smear of cervix    Arthritis    hands,pt denies 11/11/21   Depression    Hypothyroidism    Pelvic pain    PONV (postoperative nausea and vomiting)    Substance abuse ([place])    ETOH,RECOVERY FOR 4 YEARS UPDATED 11/11/21   Thyroid disease    Past Surgical History:  Procedure Laterality Date   APPENDECTOMY     BREAST LUMPECTOMY WITH RADIOACTIVE SEED LOCALIZATION Left 03/25/2022   Procedure: LEFT BREAST LUMPECTOMY WITH RADIOACTIVE SEED LOCALIZATION;  Surgeon: Coralie Keens, MD;  Location: [facility];  Service: General;  Laterality: Left;   LAPAROSCOPY N/A 05/31/2013   Procedure: LAPAROSCOPY OPERATIVE, FULGURATION OF ENDOMETRIOSIS, REMOVAL OF BILATERAL FALLOPIAN TUBE CYST;  Surgeon: Marylynn Pearson, MD;  Location: [facility];  Service: Gynecology;  Laterality: N/A;   LEEP  2013   OPEN REDUCTION INTERNAL FIXATION (ORIF) TIBIA/FIBULA FRACTURE  04/22/2022   RHINOPLASTY     WISDOM TOOTH EXTRACTION     Patient Active Problem List   Diagnosis Date Noted   Endometriosis 07/26/2019   Abnormal cervical Papanicolaou smear 07/26/2019   Hypothyroidism 06/05/2018   PTSD (post-traumatic stress disorder) 06/05/2018   Clinodactyly 09/28/2012   Degenerative arthritis of finger 09/28/2012    PCP: Velna Hatchet, MD   REFERRING PROVIDER: Renette Butters,  MD  REFERRING DIAG: S82.892 left ankle fracture with ORIF  THERAPY DIAG:  Stiffness of left ankle, not elsewhere classified  Pain in left ankle and joints of left foot  Difficulty in walking, not elsewhere classified  Muscle weakness (generalized)  Cramp and spasm  Rationale for Evaluation and Treatment: Rehabilitation  ONSET DATE: 06/04/2022  SUBJECTIVE:   SUBJECTIVE STATEMENT: Patient states she is more out of the boot now.  Not using crutch at all.  Wearing boot to do home visits.         PERTINENT HISTORY: Recent lumpectomy PAIN:  Are you having pain? Yes: NPRS scale: mild at rest but sharp, shooting with weight bearing 0/10 Pain location: left ankle Pain description: sharp shooting Aggravating factors: weight bearing Relieving factors: rest, limiting wb  PRECAUTIONS: Other: currently 6 weeks post op, ease into weight bearing in boot for now ,then wean from boot by 8 weeks.  WEIGHT BEARING RESTRICTIONS: No  FALLS:  Has patient fallen in last 6 months? Yes. Number of falls 1  LIVING ENVIRONMENT: Lives with: lives with their family and lives with their spouse Lives in: House/apartment Stairs: Yes: Internal: multiple steps; rails and External: yes steps; yes Has following equipment at home: Crutches  OCCUPATION: nurse  PLOF: Independent, Independent with basic ADLs, Independent with household mobility without device, Independent with community mobility without device, Independent with homemaking with ambulation, Independent with gait,  and Independent with transfers  PATIENT GOALS: To resume her usual fitness level and be able to be active again, would like to snow ski at end of March.    OBJECTIVE:   DIAGNOSTIC FINDINGS: na  PATIENT SURVEYS:  FOTO complete next visit  COGNITION: Overall cognitive status: Within functional limits for tasks assessed     SENSATION: Decreased proprioception due to casted and in boot with NWB status    LOWER EXTREMITY  ROM:  Active ROM Right eval Left eval  Hip flexion    Hip extension    Hip abduction    Hip adduction    Hip internal rotation    Hip external rotation    Knee flexion    Knee extension    Ankle dorsiflexion  0  Ankle plantarflexion  30  Ankle inversion  30  Ankle eversion  10   (Blank rows = not tested)  LOWER EXTREMITY MMT:  Deferred due to healing   FUNCTIONAL TESTS:  06/08/22: 5 times sit to stand: 9.01 sec Timed up and go (TUG): 11.26 sec    GAIT: Distance walked: 100 ft Assistive device utilized: Crutches Level of assistance: SBA Comments: antalgic, in boot   TODAY'S TREATMENT:    DATE: 06/29/22 Observed ambulation: patient demonstrates good heel to toe progression but with slightly less stance time on involved Achilles stretch/AAROM with foot in chair x 10 hold 10 sec Rocker board x 2 min Rocker board gastroc and soleus stretching x 5 hold 10 sec each  SLS on balance pad 5 times attempting 10 sec hold (left) SLS (floor) with cone on foot stool 3 x 10 (left) SLS (floor) with ball toss 3 x 20 tosses Game ready x 10 min to left ankle  DATE: 06/24/22 Gait training without boot and without a.d. 100 feet focusing on normalizing heel to toe progression and normal step length Standing gastroc soleus stretching on edge of multi hip machine.  Then with rocker board propped against platform of multi hip 5 x 10 sec each Heel raises on edge of multi hip machine x 10 Step tap up on foot stool standing on left LE x 20 Lunges fwd x 20 (right foot coming fwd) SLS static x 5 hold 10 sec each Dynamic SLS (cone touch- cone in chair) 3 sets of 10 (floor) Suggested DC boot unless going out for home visit for work, no crutch but be sure to have a shoe on at all times.   Game ready to Left ankle x 10 min.   DATE: 06/21/22 Nustep for ROM Level 5x 6 min- legs only- PT present to discuss progress Seated Baps: Level 2x 1 min each- no assistance needed today Gait/neuro re-ed:   Modified single leg stance on Lt with min UE support x5 Forward and sidestepping over hurdles in // bars x4 laps  AirEx balance beam: sidestepping and tandem line walking with UE support as needed Standing heel raises: 2x10 Gait without boot using crutch with verbal cueing for trunk alignment, knee position with stance and heel strike on Lt, in parallel bars with use of mirror for visual feedback Game ready to Left ankle x 10 min.                             PATIENT EDUCATION:  Education details: Instructed in protocol and how to progress weight bearing, gait training with single axillary crutch, initiated HEP Person educated: Patient Education method: Explanation, Demonstration, Verbal  cues, and Handouts Education comprehension: verbalized understanding, returned demonstration, and verbal cues required  HOME EXERCISE PROGRAM: Access Code: [email or web address] URL: [email or web address] Date: 06/10/2022 Prepared by: Claiborne Billings  Exercises - Supine Single Leg Ankle Pumps  - 1 x daily - 7 x weekly - 3 sets - 10 reps - Seated Ankle Alphabet  - 1 x daily - 7 x weekly - 3 sets - 10 reps - Seated Ankle Dorsiflexion Stretch  - 1 x daily - 7 x weekly - 3 sets - 10 reps - Seated Toe Raise  - 1 x daily - 7 x weekly - 3 sets - 10 reps - Seated Hamstring Stretch  - 1 x daily - 7 x weekly - 3 sets - 10 reps - Supine Piriformis Stretch  - 1 x daily - 7 x weekly - 3 sets - 10 reps - Seated Figure 4 Piriformis Stretch  - 1 x daily - 7 x weekly - 3 sets - 10 reps - Long Sitting Ankle Eversion with Resistance  - 2 x daily - 7 x weekly - 2 sets - 10 reps -   hold - Long Sitting Ankle Inversion with Resistance  - 2 x daily - 7 x weekly - 2 sets - 10 reps - Long Sitting Ankle Plantar Flexion with Resistance  - 2 x daily - 7 x weekly - 2 sets - 10 reps - Long Sitting Ankle Dorsiflexion with Anchored Resistance  - 2 x daily - 7 x weekly - 2 sets - 10 reps  ASSESSMENT:  CLINICAL IMPRESSION: Mimi is  progressing very well functionally.  She has increased swelling at times if she is up on her feet for a long period of time. However, this dissipates easily with elevation and rest.  She is meeting goals ahead of schedule and is very compliant.   She should continue to progress with ease.   Patient will benefit from skilled PT to address the below impairments and improve overall function.   OBJECTIVE IMPAIRMENTS: Abnormal gait, decreased balance, decreased mobility, difficulty walking, decreased ROM, decreased strength, increased fascial restrictions, increased muscle spasms, impaired flexibility, impaired sensation, and pain.   ACTIVITY LIMITATIONS: carrying, lifting, bending, standing, squatting, stairs, transfers, toileting, dressing, and caring for others  PARTICIPATION LIMITATIONS: meal prep, cleaning, laundry, driving, shopping, community activity, occupation, and yard work  PERSONAL FACTORS: 1 comorbidity: recent lumpectomy  are also affecting patient's functional outcome.   REHAB POTENTIAL: Excellent  CLINICAL DECISION MAKING: Stable/uncomplicated  EVALUATION COMPLEXITY: Low   GOALS: Goals reviewed with patient? Yes  SHORT TERM GOALS: Target date: 07/02/2022   Patient will be independent with initial HEP  Baseline: Goal status: MET  2.  Pain report to be no greater than 4/10  Baseline: no ankle pain (06/21/22) Goal status: MET  3.  Patient to be able to ambulate in boot without crutches safely Baseline: ambulating with boot and crutches without difficulty Goal status: MET  4.  Ankle ROM to improve by 3-5 degrees in each plane of motion Baseline:  Goal status: INITIAL  5.  Patient to be able to wean from boot at home Baseline: started working on this today (06/17/22) Goal status: In progress    LONG TERM GOALS: Target date: 07/30/2022    Patient to be independent with advanced HEP  Baseline:  Goal status: INITIAL  2.  Patient to report pain no greater than 2/10   Baseline:  Goal status: IN PROGRESS  3.  Patient to be able to demonstrate normal heel  to toe progression in full WB out of boot Baseline:  Goal status: IN PROGRESS  4.  5 times sit to stand to be less than 10 sec Baseline:  Goal status: INITIAL  5.  TUG score to be less than 10 sec Baseline:  Goal status: INITIAL  6.  FOTO score to be at or above predicted score Baseline:  Goal status: INITIAL   PLAN:  PT FREQUENCY: 1-2x/week  PT DURATION: 8 weeks  PLANNED INTERVENTIONS: Therapeutic exercises, Therapeutic activity, Neuromuscular re-education, Balance training, Gait training, Patient/Family education, Self Care, Joint mobilization, Stair training, Orthotic/Fit training, DME instructions, Aquatic Therapy, Dry Needling, Electrical stimulation, Cryotherapy, Moist heat, Compression bandaging, scar mobilization, Splintting, Taping, Vasopneumatic device, Ionotophoresis '4mg'$ /ml Dexamethasone, Manual therapy, and Re-evaluation  PLAN FOR NEXT SESSION: FOTO?,  measure Lt ankle A/ROM, progress dynamic SLS activity as tolerated, step ups and downs,  gait and gluteal activation   Zykee Avakian B. Edoardo Laforte, PT 06/30/22 9:50 AM  [facility] [address] [place], [place] [address] Phone # [phone] Fax [phone]

## 2022-07-05 ENCOUNTER — Ambulatory Visit: Payer: BC Managed Care – PPO

## 2022-07-05 DIAGNOSIS — M25572 Pain in left ankle and joints of left foot: Secondary | ICD-10-CM

## 2022-07-05 DIAGNOSIS — R262 Difficulty in walking, not elsewhere classified: Secondary | ICD-10-CM | POA: Diagnosis not present

## 2022-07-05 DIAGNOSIS — M25672 Stiffness of left ankle, not elsewhere classified: Secondary | ICD-10-CM

## 2022-07-05 DIAGNOSIS — M6281 Muscle weakness (generalized): Secondary | ICD-10-CM

## 2022-07-05 DIAGNOSIS — R252 Cramp and spasm: Secondary | ICD-10-CM | POA: Diagnosis not present

## 2022-07-05 NOTE — Therapy (Signed)
OUTPATIENT PHYSICAL THERAPY LOWER EXTREMITY TREATMENT NOTE   Patient Name: Crystal Mcknight MRN: [id number] DOB:30-Mar-1976, 47 y.o., female Today's Date: 07/05/2022  END OF SESSION:  PT End of Session - 07/05/22 1220     Visit Number 9    Date for PT Re-Evaluation 07/30/22    Authorization Type BL[employer]    Progress Note Due on Visit 10    PT Start Time 1140    PT Stop Time 1230    PT Time Calculation (min) 50 min    Activity Tolerance Patient tolerated treatment well    Behavior During Therapy [facility] for tasks assessed/performed                 Past Medical History:  Diagnosis Date   Abnormal Pap smear of cervix    Arthritis    hands,pt denies 11/11/21   Depression    Hypothyroidism    Pelvic pain    PONV (postoperative nausea and vomiting)    Substance abuse ([place])    ETOH,RECOVERY FOR 4 YEARS UPDATED 11/11/21   Thyroid disease    Past Surgical History:  Procedure Laterality Date   APPENDECTOMY     BREAST LUMPECTOMY WITH RADIOACTIVE SEED LOCALIZATION Left 03/25/2022   Procedure: LEFT BREAST LUMPECTOMY WITH RADIOACTIVE SEED LOCALIZATION;  Surgeon: Coralie Keens, MD;  Location: [place];  Service: General;  Laterality: Left;   LAPAROSCOPY N/A 05/31/2013   Procedure: LAPAROSCOPY OPERATIVE, FULGURATION OF ENDOMETRIOSIS, REMOVAL OF BILATERAL FALLOPIAN TUBE CYST;  Surgeon: Marylynn Pearson, MD;  Location: [facility];  Service: Gynecology;  Laterality: N/A;   LEEP  2013   OPEN REDUCTION INTERNAL FIXATION (ORIF) TIBIA/FIBULA FRACTURE  04/22/2022   RHINOPLASTY     WISDOM TOOTH EXTRACTION     Patient Active Problem List   Diagnosis Date Noted   Endometriosis 07/26/2019   Abnormal cervical Papanicolaou smear 07/26/2019   Hypothyroidism 06/05/2018   PTSD (post-traumatic stress disorder) 06/05/2018   Clinodactyly 09/28/2012   Degenerative arthritis of finger 09/28/2012    PCP: Velna Hatchet, MD   REFERRING PROVIDER: Renette Butters,  MD  REFERRING DIAG: S82.892 left ankle fracture with ORIF  THERAPY DIAG:  Stiffness of left ankle, not elsewhere classified  Pain in left ankle and joints of left foot  Difficulty in walking, not elsewhere classified  Muscle weakness (generalized)  Rationale for Evaluation and Treatment: Rehabilitation  ONSET DATE: 06/04/2022  SUBJECTIVE:   SUBJECTIVE STATEMENT: I have been spending more time out of my boot.      PERTINENT HISTORY: Recent lumpectomy PAIN:  Are you having pain? Yes: NPRS scale: mild at rest but sharp, shooting with weight bearing 0/10 Pain location: left ankle Pain description: sharp shooting Aggravating factors: weight bearing Relieving factors: rest, limiting wb  PRECAUTIONS: Other: currently 6 weeks post op, ease into weight bearing in boot for now ,then wean from boot by 8 weeks.  WEIGHT BEARING RESTRICTIONS: No  FALLS:  Has patient fallen in last 6 months? Yes. Number of falls 1  LIVING ENVIRONMENT: Lives with: lives with their family and lives with their spouse Lives in: House/apartment Stairs: Yes: Internal: multiple steps; rails and External: yes steps; yes Has following equipment at home: Crutches  OCCUPATION: nurse  PLOF: Independent, Independent with basic ADLs, Independent with household mobility without device, Independent with community mobility without device, Independent with homemaking with ambulation, Independent with gait, and Independent with transfers  PATIENT GOALS: To resume her usual fitness level and be able to be active  again, would like to snow ski at end of March.    OBJECTIVE:   DIAGNOSTIC FINDINGS: na  PATIENT SURVEYS:    COGNITION: Overall cognitive status: Within functional limits for tasks assessed     SENSATION: Decreased proprioception due to casted and in boot with NWB status    LOWER EXTREMITY ROM:  Active ROM Right eval Left eval  Hip flexion    Hip extension    Hip abduction    Hip  adduction    Hip internal rotation    Hip external rotation    Knee flexion    Knee extension    Ankle dorsiflexion  0  Ankle plantarflexion  30  Ankle inversion  30  Ankle eversion  10   (Blank rows = not tested)  LOWER EXTREMITY MMT:  Deferred due to healing   FUNCTIONAL TESTS:  06/08/22: 5 times sit to stand: 9.01 sec Timed up and go (TUG): 11.26 sec    GAIT: Distance walked: 100 ft Assistive device utilized: Crutches Level of assistance: SBA Comments: antalgic, in boot   TODAY'S TREATMENT:    DATE: 07/05/22 ambulation: patient demonstrates good heel to toe progression but with slightly less stance time on involved.  Able to correct with verbal cues. NuStep: Level 5 x 8 min- PT present to discuss progress Achilles stretch/AAROM with foot in chair x 10 hold 10 sec Rocker board x 2 min Obstacle course: stepping over hurdles and on to cobble squares.  Sidestepping over hurdles each direction Step-up on Bosu Lt 2x10 SLS on balance pad 5 times attempting 10 sec hold (left) SLS (floor) with cone on foot stool 3 x 10 (left) SLS (floor) with ball toss 3 x 20 tosses at [employer] on flat surface of bosu 2x30 seconds  Single leg stance on blue pod with intermittent UE support 3x10 seconds  Game ready x 10 min to left ankle  DATE: 06/29/22 Observed ambulation: patient demonstrates good heel to toe progression but with slightly less stance time on involved Achilles stretch/AAROM with foot in chair x 10 hold 10 sec Rocker board x 2 min Rocker board gastroc and soleus stretching x 5 hold 10 sec each  SLS on balance pad 5 times attempting 10 sec hold (left) SLS (floor) with cone on foot stool 3 x 10 (left) SLS (floor) with ball toss 3 x 20 tosses Game ready x 10 min to left ankle  DATE: 06/24/22 Gait training without boot and without a.d. 100 feet focusing on normalizing heel to toe progression and normal step length Standing gastroc soleus stretching on edge of multi hip  machine.  Then with rocker board propped against platform of multi hip 5 x 10 sec each Heel raises on edge of multi hip machine x 10 Step tap up on foot stool standing on left LE x 20 Lunges fwd x 20 (right foot coming fwd) SLS static x 5 hold 10 sec each Dynamic SLS (cone touch- cone in chair) 3 sets of 10 (floor) Suggested DC boot unless going out for home visit for work, no crutch but be sure to have a shoe on at all times.   Game ready to Left ankle x 10 min.                       PATIENT EDUCATION:  Education details: Instructed in protocol and how to progress weight bearing, gait training with single axillary crutch, initiated HEP Person educated: Patient Education method: Explanation, Demonstration, Verbal  cues, and Handouts Education comprehension: verbalized understanding, returned demonstration, and verbal cues required  HOME EXERCISE PROGRAM: Access Code: [email or web address] URL: [email or web address] Date: 06/10/2022 Prepared by: Claiborne Billings  Exercises - Supine Single Leg Ankle Pumps  - 1 x daily - 7 x weekly - 3 sets - 10 reps - Seated Ankle Alphabet  - 1 x daily - 7 x weekly - 3 sets - 10 reps - Seated Ankle Dorsiflexion Stretch  - 1 x daily - 7 x weekly - 3 sets - 10 reps - Seated Toe Raise  - 1 x daily - 7 x weekly - 3 sets - 10 reps - Seated Hamstring Stretch  - 1 x daily - 7 x weekly - 3 sets - 10 reps - Supine Piriformis Stretch  - 1 x daily - 7 x weekly - 3 sets - 10 reps - Seated Figure 4 Piriformis Stretch  - 1 x daily - 7 x weekly - 3 sets - 10 reps - Long Sitting Ankle Eversion with Resistance  - 2 x daily - 7 x weekly - 2 sets - 10 reps -   hold - Long Sitting Ankle Inversion with Resistance  - 2 x daily - 7 x weekly - 2 sets - 10 reps - Long Sitting Ankle Plantar Flexion with Resistance  - 2 x daily - 7 x weekly - 2 sets - 10 reps - Long Sitting Ankle Dorsiflexion with Anchored Resistance  - 2 x daily - 7 x weekly - 2 sets - 10 reps  ASSESSMENT:  CLINICAL  IMPRESSION: Pt is doing well with HEP and gait progression.  She spending more time out of the boot and symmetry is improving.  Pt did very well with balance and proprioception exercises. Pt did very well stabilizing on the Lt LE.  She should continue to progress with ease. Patient will benefit from skilled PT to address the below impairments and improve overall function.   OBJECTIVE IMPAIRMENTS: Abnormal gait, decreased balance, decreased mobility, difficulty walking, decreased ROM, decreased strength, increased fascial restrictions, increased muscle spasms, impaired flexibility, impaired sensation, and pain.   ACTIVITY LIMITATIONS: carrying, lifting, bending, standing, squatting, stairs, transfers, toileting, dressing, and caring for others  PARTICIPATION LIMITATIONS: meal prep, cleaning, laundry, driving, shopping, community activity, occupation, and yard work  PERSONAL FACTORS: 1 comorbidity: recent lumpectomy  are also affecting patient's functional outcome.   REHAB POTENTIAL: Excellent  CLINICAL DECISION MAKING: Stable/uncomplicated  EVALUATION COMPLEXITY: Low   GOALS: Goals reviewed with patient? Yes  SHORT TERM GOALS: Target date: 07/02/2022   Patient will be independent with initial HEP  Baseline: Goal status: MET  2.  Pain report to be no greater than 4/10  Baseline: no ankle pain (06/21/22) Goal status: MET  3.  Patient to be able to ambulate in boot without crutches safely Baseline: ambulating with boot and crutches without difficulty Goal status: MET  4.  Ankle ROM to improve by 3-5 degrees in each plane of motion Baseline:  Goal status: INITIAL  5.  Patient to be able to wean from boot at home Baseline: started working on this today (06/17/22) Goal status: In progress    LONG TERM GOALS: Target date: 07/30/2022    Patient to be independent with advanced HEP  Baseline:  Goal status: INITIAL  2.  Patient to report pain no greater than 2/10  Baseline:  Goal  status: IN PROGRESS  3.  Patient to be able to demonstrate normal heel to toe progression in full WB out of  boot Baseline:  Goal status: IN PROGRESS  4.  5 times sit to stand to be less than 10 sec Baseline:  Goal status: INITIAL  5.  TUG score to be less than 10 sec Baseline:  Goal status: INITIAL  6.  FOTO score to be at or above predicted score Baseline:  Goal status: INITIAL   PLAN:  PT FREQUENCY: 1-2x/week  PT DURATION: 8 weeks  PLANNED INTERVENTIONS: Therapeutic exercises, Therapeutic activity, Neuromuscular re-education, Balance training, Gait training, Patient/Family education, Self Care, Joint mobilization, Stair training, Orthotic/Fit training, DME instructions, Aquatic Therapy, Dry Needling, Electrical stimulation, Cryotherapy, Moist heat, Compression bandaging, scar mobilization, Splintting, Taping, Vasopneumatic device, Ionotophoresis 37m/ml Dexamethasone, Manual therapy, and Re-evaluation  PLAN FOR NEXT SESSION: Pt going to MD  on Monday, do FIvor Reining PT 07/05/22 12:21 PM   [address] [place] [address]Phone # [phone] [phone]

## 2022-07-07 ENCOUNTER — Ambulatory Visit: Payer: BC Managed Care – PPO

## 2022-07-12 ENCOUNTER — Encounter: Payer: Self-pay | Admitting: *Deleted

## 2022-07-12 ENCOUNTER — Ambulatory Visit: Payer: BC Managed Care – PPO

## 2022-07-12 DIAGNOSIS — M25572 Pain in left ankle and joints of left foot: Secondary | ICD-10-CM

## 2022-07-12 DIAGNOSIS — M25672 Stiffness of left ankle, not elsewhere classified: Secondary | ICD-10-CM

## 2022-07-12 DIAGNOSIS — R252 Cramp and spasm: Secondary | ICD-10-CM | POA: Diagnosis not present

## 2022-07-12 DIAGNOSIS — S82842D Displaced bimalleolar fracture of left lower leg, subsequent encounter for closed fracture with routine healing: Secondary | ICD-10-CM | POA: Diagnosis not present

## 2022-07-12 DIAGNOSIS — R262 Difficulty in walking, not elsewhere classified: Secondary | ICD-10-CM | POA: Diagnosis not present

## 2022-07-12 DIAGNOSIS — M6281 Muscle weakness (generalized): Secondary | ICD-10-CM

## 2022-07-12 NOTE — Therapy (Addendum)
OUTPATIENT PHYSICAL THERAPY LOWER EXTREMITY TREATMENT NOTE   Patient Name: Crystal Mcknight MRN: [id number] DOB:February 11, 1976, 47 y.o., female Today's Date: 07/12/2022  END OF SESSION:  PT End of Session - 07/12/22 1158     Visit Number 10    Date for PT Re-Evaluation 07/30/22    Authorization Type BL[employer]UE [employer]    PT Start Time 1114    PT Stop Time 1202    PT Time Calculation (min) 48 min    Activity Tolerance Patient tolerated treatment well    Behavior During Therapy [facility] for tasks assessed/performed                  Past Medical History:  Diagnosis Date   Abnormal Pap smear of cervix    Arthritis    hands,pt denies 11/11/21   Depression    Hypothyroidism    Pelvic pain    PONV (postoperative nausea and vomiting)    Substance abuse ([place])    ETOH,RECOVERY FOR 4 YEARS UPDATED 11/11/21   Thyroid disease    Past Surgical History:  Procedure Laterality Date   APPENDECTOMY     BREAST LUMPECTOMY WITH RADIOACTIVE SEED LOCALIZATION Left 03/25/2022   Procedure: LEFT BREAST LUMPECTOMY WITH RADIOACTIVE SEED LOCALIZATION;  Surgeon: Coralie Keens, MD;  Location: [facility];  Service: General;  Laterality: Left;   LAPAROSCOPY N/A 05/31/2013   Procedure: LAPAROSCOPY OPERATIVE, FULGURATION OF ENDOMETRIOSIS, REMOVAL OF BILATERAL FALLOPIAN TUBE CYST;  Surgeon: Marylynn Pearson, MD;  Location: [facility];  Service: Gynecology;  Laterality: N/A;   LEEP  2013   OPEN REDUCTION INTERNAL FIXATION (ORIF) TIBIA/FIBULA FRACTURE  04/22/2022   RHINOPLASTY     WISDOM TOOTH EXTRACTION     Patient Active Problem List   Diagnosis Date Noted   Endometriosis 07/26/2019   Abnormal cervical Papanicolaou smear 07/26/2019   Hypothyroidism 06/05/2018   PTSD (post-traumatic stress disorder) 06/05/2018   Clinodactyly 09/28/2012   Degenerative arthritis of finger 09/28/2012    PCP: Velna Hatchet, MD   REFERRING PROVIDER: Renette Butters, MD  REFERRING DIAG: S82.892  left ankle fracture with ORIF  THERAPY DIAG:  Stiffness of left ankle, not elsewhere classified  Pain in left ankle and joints of left foot  Difficulty in walking, not elsewhere classified  Muscle weakness (generalized)  Rationale for Evaluation and Treatment: Rehabilitation  ONSET DATE: 06/04/2022  SUBJECTIVE:   SUBJECTIVE STATEMENT: I worked all week last week and I was tired.  I walked a longer distance from the car to a restaurant.  I am not wearing the boot this week.    PERTINENT HISTORY: Recent lumpectomy PAIN:  Are you having pain? Yes: NPRS scale: mild at rest but sharp, shooting with weight bearing 0/10 Pain location: left ankle Pain description: sharp shooting Aggravating factors: weight bearing Relieving factors: rest, limiting wb  PRECAUTIONS: Other: currently 6 weeks post op, ease into weight bearing in boot for now ,then wean from boot by 8 weeks.  WEIGHT BEARING RESTRICTIONS: No  FALLS:  Has patient fallen in last 6 months? Yes. Number of falls 1  LIVING ENVIRONMENT: Lives with: lives with their family and lives with their spouse Lives in: House/apartment Stairs: Yes: Internal: multiple steps; rails and External: yes steps; yes Has following equipment at home: Crutches  OCCUPATION: nurse  PLOF: Independent, Independent with basic ADLs, Independent with household mobility without device, Independent with community mobility without device, Independent with homemaking with ambulation, Independent with gait, and Independent with transfers  PATIENT GOALS: To  resume her usual fitness level and be able to be active again, would like to snow ski at end of March.    OBJECTIVE:   DIAGNOSTIC FINDINGS: na  PATIENT SURVEYS:    COGNITION: Overall cognitive status: Within functional limits for tasks assessed     SENSATION: Decreased proprioception due to casted and in boot with NWB status    LOWER EXTREMITY ROM:  Active ROM Right eval Left eval   Hip flexion    Hip extension    Hip abduction    Hip adduction    Hip internal rotation    Hip external rotation    Knee flexion    Knee extension    Ankle dorsiflexion  0  Ankle plantarflexion  30  Ankle inversion  30  Ankle eversion  10   (Blank rows = not tested)  LOWER EXTREMITY MMT:  Deferred due to healing   FUNCTIONAL TESTS:  06/08/22: 5 times sit to stand: 9.01 sec Timed up and go (TUG): 11.26 sec    GAIT: Distance walked: 100 ft Assistive device utilized: Crutches Level of assistance: SBA Comments: antalgic, in boot   TODAY'S TREATMENT:    DATE: 07/12/22 Elliptical Level 4, ramp 5 x 6 minutes- PT present to discuss progress Achilles stretch/AAROM with foot in chair x 10 hold 10 sec Rocker board x 2 min Obstacle course: stepping over hurdles and on to cobble squares.  Sidestepping over hurdles each direction Step-up on Bosu Lt 2x10 SLS on Bosu 5 times attempting 10 sec hold (left) Resitsted walking: 10# forward/reverse, 5# sidestepping x10 each Floor slider vectors with single leg stance on Lt LE SLS (floor) with cone on foot stool 3 x 10 (left) Balance on flat surface of bosu 2x30 seconds  Single leg stance on blue pod with intermittent UE support 3x10 seconds  Game ready x 10 min to left ankle   DATE: 07/05/22 ambulation: patient demonstrates good heel to toe progression but with slightly less stance time on involved.  Able to correct with verbal cues. NuStep: Level 5 x 8 min- PT present to discuss progress Achilles stretch/AAROM with foot in chair x 10 hold 10 sec Rocker board x 2 min Obstacle course: stepping over hurdles and on to cobble squares.  Sidestepping over hurdles each direction Step-up on Bosu Lt 2x10 SLS on balance pad 5 times attempting 10 sec hold (left) SLS (floor) with cone on foot stool 3 x 10 (left) SLS (floor) with ball toss 3 x 20 tosses at [employer] on flat surface of bosu 2x30 seconds  Single leg stance on blue pod with  intermittent UE support 3x10 seconds  Game ready x 10 min to left ankle  DATE: 06/29/22 Observed ambulation: patient demonstrates good heel to toe progression but with slightly less stance time on involved Achilles stretch/AAROM with foot in chair x 10 hold 10 sec Rocker board x 2 min Rocker board gastroc and soleus stretching x 5 hold 10 sec each  SLS on balance pad 5 times attempting 10 sec hold (left) SLS (floor) with cone on foot stool 3 x 10 (left) SLS (floor) with ball toss 3 x 20 tosses Game ready x 10 min to left ankle  DATE: 06/24/22 Gait training without boot and without a.d. 100 feet focusing on normalizing heel to toe progression and normal step length Standing gastroc soleus stretching on edge of multi hip machine.  Then with rocker board propped against platform of multi hip 5 x 10 sec each Heel raises  on edge of multi hip machine x 10 Step tap up on foot stool standing on left LE x 20 Lunges fwd x 20 (right foot coming fwd) SLS static x 5 hold 10 sec each Dynamic SLS (cone touch- cone in chair) 3 sets of 10 (floor) Suggested DC boot unless going out for home visit for work, no crutch but be sure to have a shoe on at all times.   Game ready to Left ankle x 10 min.                       PATIENT EDUCATION:  Education details: Instructed in protocol and how to progress weight bearing, gait training with single axillary crutch, initiated HEP Person educated: Patient Education method: Explanation, Demonstration, Verbal cues, and Handouts Education comprehension: verbalized understanding, returned demonstration, and verbal cues required  HOME EXERCISE PROGRAM: Access Code: [email or web address] URL: [email or web address] Date: 06/10/2022 Prepared by: Claiborne Billings  Exercises - Supine Single Leg Ankle Pumps  - 1 x daily - 7 x weekly - 3 sets - 10 reps - Seated Ankle Alphabet  - 1 x daily - 7 x weekly - 3 sets - 10 reps - Seated Ankle Dorsiflexion Stretch  - 1 x daily - 7 x weekly  - 3 sets - 10 reps - Seated Toe Raise  - 1 x daily - 7 x weekly - 3 sets - 10 reps - Seated Hamstring Stretch  - 1 x daily - 7 x weekly - 3 sets - 10 reps - Supine Piriformis Stretch  - 1 x daily - 7 x weekly - 3 sets - 10 reps - Seated Figure 4 Piriformis Stretch  - 1 x daily - 7 x weekly - 3 sets - 10 reps - Long Sitting Ankle Eversion with Resistance  - 2 x daily - 7 x weekly - 2 sets - 10 reps -   hold - Long Sitting Ankle Inversion with Resistance  - 2 x daily - 7 x weekly - 2 sets - 10 reps - Long Sitting Ankle Plantar Flexion with Resistance  - 2 x daily - 7 x weekly - 2 sets - 10 reps - Long Sitting Ankle Dorsiflexion with Anchored Resistance  - 2 x daily - 7 x weekly - 2 sets - 10 reps  ASSESSMENT:  CLINICAL IMPRESSION: Pt is weaning from boot and hasn't worn it for several days.  She did very well with balance and proprioceptive challenges in the clinic and stabilized well at her pelvis and ankles. Pt with minimal gait asymmetry with gait on level surface without device. Patient will benefit from skilled PT to address the below impairments and improve overall function.   OBJECTIVE IMPAIRMENTS: Abnormal gait, decreased balance, decreased mobility, difficulty walking, decreased ROM, decreased strength, increased fascial restrictions, increased muscle spasms, impaired flexibility, impaired sensation, and pain.   ACTIVITY LIMITATIONS: carrying, lifting, bending, standing, squatting, stairs, transfers, toileting, dressing, and caring for others  PARTICIPATION LIMITATIONS: meal prep, cleaning, laundry, driving, shopping, community activity, occupation, and yard work  PERSONAL FACTORS: 1 comorbidity: recent lumpectomy  are also affecting patient's functional outcome.   REHAB POTENTIAL: Excellent  CLINICAL DECISION MAKING: Stable/uncomplicated  EVALUATION COMPLEXITY: Low   GOALS: Goals reviewed with patient? Yes  SHORT TERM GOALS: Target date: 07/02/2022   Patient will be  independent with initial HEP  Baseline: Goal status: MET  2.  Pain report to be no greater than 4/10  Baseline: no ankle pain (06/21/22) Goal status: MET  3.  Patient to be able to ambulate in boot without crutches safely Baseline: ambulating with boot and crutches without difficulty Goal status: MET  4.  Ankle ROM to improve by 3-5 degrees in each plane of motion Baseline:  Goal status: INITIAL  5.  Patient to be able to wean from boot at home Baseline: not wearing boot (07/12/22) Goal status: MET    LONG TERM GOALS: Target date: 07/30/2022    Patient to be independent with advanced HEP  Baseline:  Goal status: INITIAL  2.  Patient to report pain no greater than 2/10  Baseline:  Goal status: IN PROGRESS  3.  Patient to be able to demonstrate normal heel to toe progression in full WB out of boot Baseline:  Goal status: MET  4.  5 times sit to stand to be less than 10 sec Baseline:  Goal status: INITIAL  5.  TUG score to be less than 10 sec Baseline:  Goal status: MET  6.  FOTO score to be at or above predicted score Baseline:  Goal status: INITIAL   PLAN:  PT FREQUENCY: 1-2x/week  PT DURATION: 8 weeks  PLANNED INTERVENTIONS: Therapeutic exercises, Therapeutic activity, Neuromuscular re-education, Balance training, Gait training, Patient/Family education, Self Care, Joint mobilization, Stair training, Orthotic/Fit training, DME instructions, Aquatic Therapy, Dry Needling, Electrical stimulation, Cryotherapy, Moist heat, Compression bandaging, scar mobilization, Splintting, Taping, Vasopneumatic device, Ionotophoresis 32m/ml Dexamethasone, Manual therapy, and Re-evaluation  PLAN FOR NEXT SESSION: Continue Lt ankle stability, gait. FOTO next    KSigurd Sos PT 07/12/22 12:03 PM   [facility] [address] [place] [address]Phone # [phone] [phone]

## 2022-07-13 ENCOUNTER — Ambulatory Visit (HOSPITAL_BASED_OUTPATIENT_CLINIC_OR_DEPARTMENT_OTHER)
Admission: RE | Admit: 2022-07-13 | Discharge: 2022-07-13 | Disposition: A | Discharge status: Home / Self Care | Payer: BC Managed Care – PPO | Source: Ambulatory Visit | Attending: Obstetrics & Gynecology | Admitting: Obstetrics & Gynecology

## 2022-07-13 DIAGNOSIS — S82202A Unspecified fracture of shaft of left tibia, initial encounter for closed fracture: Secondary | ICD-10-CM | POA: Insufficient documentation

## 2022-07-13 DIAGNOSIS — Z780 Asymptomatic menopausal state: Secondary | ICD-10-CM | POA: Diagnosis not present

## 2022-07-13 DIAGNOSIS — S82402A Unspecified fracture of shaft of left fibula, initial encounter for closed fracture: Secondary | ICD-10-CM

## 2022-07-14 ENCOUNTER — Ambulatory Visit: Payer: BC Managed Care – PPO

## 2022-07-14 DIAGNOSIS — M25572 Pain in left ankle and joints of left foot: Secondary | ICD-10-CM

## 2022-07-14 DIAGNOSIS — R252 Cramp and spasm: Secondary | ICD-10-CM

## 2022-07-14 DIAGNOSIS — R262 Difficulty in walking, not elsewhere classified: Secondary | ICD-10-CM | POA: Diagnosis not present

## 2022-07-14 DIAGNOSIS — M25672 Stiffness of left ankle, not elsewhere classified: Secondary | ICD-10-CM

## 2022-07-14 DIAGNOSIS — M6281 Muscle weakness (generalized): Secondary | ICD-10-CM | POA: Diagnosis not present

## 2022-07-14 NOTE — Therapy (Addendum)
OUTPATIENT PHYSICAL THERAPY LOWER EXTREMITY TREATMENT NOTE   Patient Name: Crystal Mcknight MRN: [id number] DOB:09/25/1975, 47 y.o., female Today's Date: 07/14/2022  END OF SESSION:  PT End of Session - 07/14/22 0934     Visit Number 11    Date for PT Re-Evaluation 07/30/22    Authorization Type BLUE CROSS BLUE [employer]IELD    Progress Note Due on Visit 10    PT Start Time 0930    PT Stop Time 1025    PT Time Calculation (min) 55 min    Activity Tolerance Patient tolerated treatment well    Behavior During Therapy [facility] for tasks assessed/performed                  Past Medical History:  Diagnosis Date   Abnormal Pap smear of cervix    Arthritis    hands,pt denies 11/11/21   Depression    Hypothyroidism    Pelvic pain    PONV (postoperative nausea and vomiting)    Substance abuse ([place])    ETOH,RECOVERY FOR 4 YEARS UPDATED 11/11/21   Thyroid disease    Past Surgical History:  Procedure Laterality Date   APPENDECTOMY     BREAST LUMPECTOMY WITH RADIOACTIVE SEED LOCALIZATION Left 03/25/2022   Procedure: LEFT BREAST LUMPECTOMY WITH RADIOACTIVE SEED LOCALIZATION;  Surgeon: Coralie Keens, MD;  Location: [facility];  Service: General;  Laterality: Left;   LAPAROSCOPY N/A 05/31/2013   Procedure: LAPAROSCOPY OPERATIVE, FULGURATION OF ENDOMETRIOSIS, REMOVAL OF BILATERAL FALLOPIAN TUBE CYST;  Surgeon: Marylynn Pearson, MD;  Location: [facility];  Service: Gynecology;  Laterality: N/A;   LEEP  2013   OPEN REDUCTION INTERNAL FIXATION (ORIF) TIBIA/FIBULA FRACTURE  04/22/2022   RHINOPLASTY     WISDOM TOOTH EXTRACTION     Patient Active Problem List   Diagnosis Date Noted   Endometriosis 07/26/2019   Abnormal cervical Papanicolaou smear 07/26/2019   Hypothyroidism 06/05/2018   PTSD (post-traumatic stress disorder) 06/05/2018   Clinodactyly 09/28/2012   Degenerative arthritis of finger 09/28/2012    PCP: Velna Hatchet, MD   REFERRING PROVIDER: Renette Butters, MD  REFERRING DIAG: S82.892 left ankle fracture with ORIF  THERAPY DIAG:  Stiffness of left ankle, not elsewhere classified  Pain in left ankle and joints of left foot  Difficulty in walking, not elsewhere classified  Muscle weakness (generalized)  Cramp and spasm  Rationale for Evaluation and Treatment: Rehabilitation  ONSET DATE: 06/04/2022  SUBJECTIVE:   SUBJECTIVE STATEMENT: Patient confirms that routine daily activity is feeling back to normal unless she is really on her feet for a long period of time.  She gets some minor discomfort with this.  She has opted to forego snow skiing next month but is eager to resume some of her fitness and outdoor activities.    PERTINENT HISTORY: Recent lumpectomy PAIN:  Are you having pain? Yes: NPRS scale: mild at rest but sharp, shooting with weight bearing 0/10 Pain location: left ankle Pain description: sharp shooting Aggravating factors: weight bearing Relieving factors: rest, limiting wb  PRECAUTIONS: Other: currently 6 weeks post op, ease into weight bearing in boot for now ,then wean from boot by 8 weeks.  WEIGHT BEARING RESTRICTIONS: No  FALLS:  Has patient fallen in last 6 months? Yes. Number of falls 1  LIVING ENVIRONMENT: Lives with: lives with their family and lives with their spouse Lives in: House/apartment Stairs: Yes: Internal: multiple steps; rails and External: yes steps; yes Has following equipment at home: Crutches  OCCUPATION:  nurse  PLOF: Independent, Independent with basic ADLs, Independent with household mobility without device, Independent with community mobility without device, Independent with homemaking with ambulation, Independent with gait, and Independent with transfers  PATIENT GOALS: To resume her usual fitness level and be able to be active again, would like to snow ski at end of March.    OBJECTIVE:   DIAGNOSTIC FINDINGS: na  PATIENT SURVEYS:  FOTO:  06/03/22:   44  07/14/22:   61    COGNITION: Overall cognitive status: Within functional limits for tasks assessed     SENSATION: Decreased proprioception due to casted and in boot with NWB status    LOWER EXTREMITY ROM:  Active ROM Right eval Left eval  Hip flexion    Hip extension    Hip abduction    Hip adduction    Hip internal rotation    Hip external rotation    Knee flexion    Knee extension    Ankle dorsiflexion  0  Ankle plantarflexion  30  Ankle inversion  30  Ankle eversion  10   (Blank rows = not tested)  LOWER EXTREMITY MMT:  Deferred due to healing   FUNCTIONAL TESTS:  06/08/22: 5 times sit to stand: 9.01 sec Timed up and go (TUG): 11.26 sec    GAIT: Distance walked: 100 ft Assistive device utilized: Crutches Level of assistance: SBA Comments: antalgic, in boot   TODAY'S TREATMENT:    DATE: 07/14/22 Nustep x 8 min- PT present to discuss progress Rocker board propped on hip matrix platform: gastroc stretch 5 x 10 sec, soleus stretch 5 x 10 sec Step downs off hip matrix platform 2 x 10 Rocker board x 2 min Half roll inversion/eversion standing  x 20 SLS (floor) with cone on hip matrix platform 3 x 10 (left) Obstacle course: stepping over hurdles and on to cobble squares.  Sidestepping over hurdles each direction Step-up on Bosu Lt 2x10 SLS on Bosu 5 times attempting 10 sec hold (left) Initiated a few BOSU push offs for plyo metrics: x 10, attempted mini push off with uninvolved and landing on involved x 5 Game ready x 15 min to left ankle   DATE: 07/12/22 Elliptical Level 4, ramp 5 x 6 minutes- PT present to discuss progress Achilles stretch/AAROM with foot in chair x 10 hold 10 sec Rocker board x 2 min Obstacle course: stepping over hurdles and on to cobble squares.  Sidestepping over hurdles each direction Step-up on Bosu Lt 2x10 SLS on Bosu 5 times attempting 10 sec hold (left) Resitsted walking: 10# forward/reverse, 5# sidestepping x10 each Floor slider vectors  with single leg stance on Lt LE SLS (floor) with cone on foot stool 3 x 10 (left) Balance on flat surface of bosu 2x30 seconds  Single leg stance on blue pod with intermittent UE support 3x10 seconds  Game ready x 10 min to left ankle   DATE: 07/05/22 ambulation: patient demonstrates good heel to toe progression but with slightly less stance time on involved.  Able to correct with verbal cues. NuStep: Level 5 x 8 min- PT present to discuss progress Achilles stretch/AAROM with foot in chair x 10 hold 10 sec Rocker board x 2 min Obstacle course: stepping over hurdles and on to cobble squares.  Sidestepping over hurdles each direction Step-up on Bosu Lt 2x10 SLS on balance pad 5 times attempting 10 sec hold (left) SLS (floor) with cone on foot stool 3 x 10 (left) SLS (floor) with ball toss 3  x 20 tosses at [employer] on flat surface of bosu 2x30 seconds  Single leg stance on blue pod with intermittent UE support 3x10 seconds  Game ready x 10 min to left ankle                       PATIENT EDUCATION:  Education details: Instructed in protocol and how to progress weight bearing, gait training with single axillary crutch, initiated HEP Person educated: Patient Education method: Consulting civil engineer, Demonstration, Verbal cues, and Handouts Education comprehension: verbalized understanding, returned demonstration, and verbal cues required  HOME EXERCISE PROGRAM: Access Code: [email or web address] URL: [email or web address] Date: 06/10/2022 Prepared by: Claiborne Billings  Exercises - Supine Single Leg Ankle Pumps  - 1 x daily - 7 x weekly - 3 sets - 10 reps - Seated Ankle Alphabet  - 1 x daily - 7 x weekly - 3 sets - 10 reps - Seated Ankle Dorsiflexion Stretch  - 1 x daily - 7 x weekly - 3 sets - 10 reps - Seated Toe Raise  - 1 x daily - 7 x weekly - 3 sets - 10 reps - Seated Hamstring Stretch  - 1 x daily - 7 x weekly - 3 sets - 10 reps - Supine Piriformis Stretch  - 1 x daily - 7 x weekly - 3  sets - 10 reps - Seated Figure 4 Piriformis Stretch  - 1 x daily - 7 x weekly - 3 sets - 10 reps - Long Sitting Ankle Eversion with Resistance  - 2 x daily - 7 x weekly - 2 sets - 10 reps -   hold - Long Sitting Ankle Inversion with Resistance  - 2 x daily - 7 x weekly - 2 sets - 10 reps - Long Sitting Ankle Plantar Flexion with Resistance  - 2 x daily - 7 x weekly - 2 sets - 10 reps - Long Sitting Ankle Dorsiflexion with Anchored Resistance  - 2 x daily - 7 x weekly - 2 sets - 10 reps  ASSESSMENT:  CLINICAL IMPRESSION: Pt has weaned completely from boot.  She was able to tolerate addition of low level plyometrics.  She is progressing extremely well.  Patient will benefit from skilled PT to address the below impairments and improve overall function.   OBJECTIVE IMPAIRMENTS: Abnormal gait, decreased balance, decreased mobility, difficulty walking, decreased ROM, decreased strength, increased fascial restrictions, increased muscle spasms, impaired flexibility, impaired sensation, and pain.   ACTIVITY LIMITATIONS: carrying, lifting, bending, standing, squatting, stairs, transfers, toileting, dressing, and caring for others  PARTICIPATION LIMITATIONS: meal prep, cleaning, laundry, driving, shopping, community activity, occupation, and yard work  PERSONAL FACTORS: 1 comorbidity: recent lumpectomy  are also affecting patient's functional outcome.   REHAB POTENTIAL: Excellent  CLINICAL DECISION MAKING: Stable/uncomplicated  EVALUATION COMPLEXITY: Low   GOALS: Goals reviewed with patient? Yes  SHORT TERM GOALS: Target date: 07/02/2022   Patient will be independent with initial HEP  Baseline: Goal status: MET  2.  Pain report to be no greater than 4/10  Baseline: no ankle pain (06/21/22) Goal status: MET  3.  Patient to be able to ambulate in boot without crutches safely Baseline: ambulating with boot and crutches without difficulty Goal status: MET  4.  Ankle ROM to improve by 3-5  degrees in each plane of motion Baseline:  Goal status: INITIAL  5.  Patient to be able to wean from boot at home Baseline: not wearing boot (07/12/22) Goal status: MET  LONG TERM GOALS: Target date: 07/30/2022    Patient to be independent with advanced HEP  Baseline:  Goal status: INITIAL  2.  Patient to report pain no greater than 2/10  Baseline:  Goal status: IN PROGRESS  3.  Patient to be able to demonstrate normal heel to toe progression in full WB out of boot Baseline:  Goal status: MET  4.  5 times sit to stand to be less than 10 sec Baseline:  Goal status: INITIAL  5.  TUG score to be less than 10 sec Baseline:  Goal status: MET  6.  FOTO score to be at or above predicted score Baseline:  Goal status: INITIAL   PLAN:  PT FREQUENCY: 1-2x/week  PT DURATION: 8 weeks  PLANNED INTERVENTIONS: Therapeutic exercises, Therapeutic activity, Neuromuscular re-education, Balance training, Gait training, Patient/Family education, Self Care, Joint mobilization, Stair training, Orthotic/Fit training, DME instructions, Aquatic Therapy, Dry Needling, Electrical stimulation, Cryotherapy, Moist heat, Compression bandaging, scar mobilization, Splintting, Taping, Vasopneumatic device, Ionotophoresis /ml Dexamethasone, Manual therapy, and Re-evaluation  PLAN FOR NEXT SESSION: Continue Lt ankle stability, gait. Slowly add in gentle plyometrics.    Victorino Dike B. Jannessa Ogden, PT 07/14/22 12:10 PM  PHYSICAL THERAPY DISCHARGE SUMMARY  Visits from Start of Care: 11  Current functional level related to goals / functional outcomes: See above for most current PT status.     Remaining deficits: See above.     Education / Equipment: HEP   Patient agrees to discharge. Patient goals were partially met. Patient is being discharged due to not returning since the last visit.   Lorrene Reid, PT 09/14/22 7:11 AM  [facility] [address], [address] [place], [place] [address] Phone # [phone] Fax [phone]

## 2022-07-28 ENCOUNTER — Other Ambulatory Visit: Payer: Self-pay | Admitting: Surgery

## 2022-07-28 ENCOUNTER — Encounter (HOSPITAL_BASED_OUTPATIENT_CLINIC_OR_DEPARTMENT_OTHER): Payer: Self-pay | Admitting: Obstetrics & Gynecology

## 2022-07-28 DIAGNOSIS — R928 Other abnormal and inconclusive findings on diagnostic imaging of breast: Secondary | ICD-10-CM

## 2022-07-28 DIAGNOSIS — N6089 Other benign mammary dysplasias of unspecified breast: Secondary | ICD-10-CM

## 2022-07-28 DIAGNOSIS — Z803 Family history of malignant neoplasm of breast: Secondary | ICD-10-CM

## 2022-08-03 ENCOUNTER — Other Ambulatory Visit: Payer: Self-pay | Admitting: Obstetrics & Gynecology

## 2022-08-03 ENCOUNTER — Encounter (HOSPITAL_BASED_OUTPATIENT_CLINIC_OR_DEPARTMENT_OTHER): Payer: Self-pay | Admitting: *Deleted

## 2022-08-03 DIAGNOSIS — Z1231 Encounter for screening mammogram for malignant neoplasm of breast: Secondary | ICD-10-CM

## 2022-08-12 DIAGNOSIS — E039 Hypothyroidism, unspecified: Secondary | ICD-10-CM | POA: Diagnosis not present

## 2022-08-16 ENCOUNTER — Other Ambulatory Visit: Payer: Self-pay | Admitting: Internal Medicine

## 2022-08-16 DIAGNOSIS — E039 Hypothyroidism, unspecified: Secondary | ICD-10-CM

## 2022-08-25 ENCOUNTER — Encounter: Payer: Self-pay | Admitting: Internal Medicine

## 2022-08-25 DIAGNOSIS — Z1339 Encounter for screening examination for other mental health and behavioral disorders: Secondary | ICD-10-CM | POA: Diagnosis not present

## 2022-08-25 DIAGNOSIS — S82839S Other fracture of upper and lower end of unspecified fibula, sequela: Secondary | ICD-10-CM | POA: Diagnosis not present

## 2022-08-25 DIAGNOSIS — D72819 Decreased white blood cell count, unspecified: Secondary | ICD-10-CM | POA: Diagnosis not present

## 2022-08-25 DIAGNOSIS — E039 Hypothyroidism, unspecified: Secondary | ICD-10-CM | POA: Diagnosis not present

## 2022-08-25 DIAGNOSIS — Z0000 Encounter for general adult medical examination without abnormal findings: Secondary | ICD-10-CM | POA: Diagnosis not present

## 2022-08-25 DIAGNOSIS — Z1331 Encounter for screening for depression: Secondary | ICD-10-CM | POA: Diagnosis not present

## 2022-08-25 DIAGNOSIS — F341 Dysthymic disorder: Secondary | ICD-10-CM | POA: Diagnosis not present

## 2022-08-26 ENCOUNTER — Other Ambulatory Visit: Payer: Self-pay | Admitting: Internal Medicine

## 2022-08-26 DIAGNOSIS — E039 Hypothyroidism, unspecified: Secondary | ICD-10-CM

## 2022-08-26 MED ORDER — LEVOTHYROXINE SODIUM 175 MCG PO TABS: ORAL_TABLET | ORAL | 3 refills | Status: DC

## 2022-08-30 ENCOUNTER — Encounter: Payer: Self-pay | Admitting: Licensed Clinical Social Worker

## 2022-08-30 ENCOUNTER — Inpatient Hospital Stay: Payer: BC Managed Care – PPO | Attending: Genetic Counselor | Admitting: Licensed Clinical Social Worker

## 2022-08-30 ENCOUNTER — Other Ambulatory Visit: Payer: Self-pay

## 2022-08-30 ENCOUNTER — Inpatient Hospital Stay: Payer: BC Managed Care – PPO

## 2022-08-30 DIAGNOSIS — Z800 Family history of malignant neoplasm of digestive organs: Secondary | ICD-10-CM | POA: Diagnosis not present

## 2022-08-30 DIAGNOSIS — Z8041 Family history of malignant neoplasm of ovary: Secondary | ICD-10-CM

## 2022-08-30 DIAGNOSIS — Z8051 Family history of malignant neoplasm of kidney: Secondary | ICD-10-CM

## 2022-08-30 DIAGNOSIS — Z803 Family history of malignant neoplasm of breast: Secondary | ICD-10-CM | POA: Diagnosis not present

## 2022-08-30 NOTE — Progress Notes (Signed)
REFERRING PROVIDER: Jerene BearsMiller, Mary S, MD [address] [place] [id number]  PRIMARY PROVIDER:  Alysia PennaHolwerda, Scott, MD  PRIMARY REASON FOR VISIT:  1. Family history of breast cancer   2. Family history of ovarian cancer   3. Family history of stomach cancer   4. Family history of kidney cancer      HISTORY OF PRESENT ILLNESS:   Crystal Mcknight, a 47 y.o. female, was seen for a [facility] cancer genetics consultation at the request of Dr. Hyacinth MeekerMiller due to a family history of cancer.  Crystal Mcknight presents to clinic today to discuss the possibility of a hereditary predisposition to cancer, genetic testing, and to further clarify her future cancer risks, as well as potential cancer risks for family members.   CANCER HISTORY:  Crystal Mcknight is a 47 y.o. female with no personal history of cancer.    RISK FACTORS:  Menarche was at age 49-14.  First live birth at age 47.  Ovaries intact: yes.  Hysterectomy: no. - has endometriosis Menopausal status: premenopausal.  HRT use: 0 years. Colonoscopy: yes;  4 polyps . Mammogram within the last year: yes. Number of breast biopsies: 4.- recent lumpectomy for ADH Up to date with pelvic exams: yes.  Past Medical History:  Diagnosis Date   Abnormal Pap smear of cervix    Arthritis    hands,pt denies 11/11/21   Depression    Hypothyroidism    Pelvic pain    PONV (postoperative nausea and vomiting)    Substance abuse    ETOH,RECOVERY FOR 4 YEARS UPDATED 11/11/21   Thyroid disease     Past Surgical History:  Procedure Laterality Date   APPENDECTOMY     BREAST LUMPECTOMY WITH RADIOACTIVE SEED LOCALIZATION Left 03/25/2022   Procedure: LEFT BREAST LUMPECTOMY WITH RADIOACTIVE SEED LOCALIZATION;  Surgeon: Abigail MiyamotoBlackman, Douglas, MD;  Location: [facility];  Service: General;  Laterality: Left;   LAPAROSCOPY N/A 05/31/2013   Procedure: LAPAROSCOPY OPERATIVE, FULGURATION OF ENDOMETRIOSIS, REMOVAL OF BILATERAL FALLOPIAN TUBE CYST;   Surgeon: Zelphia CairoGretchen Adkins, MD;  Location: [facility];  Service: Gynecology;  Laterality: N/A;   LEEP  2013   OPEN REDUCTION INTERNAL FIXATION (ORIF) TIBIA/FIBULA FRACTURE  04/22/2022   RHINOPLASTY     WISDOM TOOTH EXTRACTION      FAMILY HISTORY:  We obtained a detailed, 4-generation family history.  Significant diagnoses are listed below: Family History  Problem Relation Age of Onset   Breast cancer Mother [id number]   Colon polyps Mother    Hyperlipidemia Mother    Colon polyps Father        >10   Hyperlipidemia Father    Hypertension Father    Diverticulosis Father    Kidney cancer Paternal Uncle        d.>50   Breast cancer Maternal Grandmother        dx 7470s   Heart disease Maternal Grandfather    Stomach cancer Paternal Grandmother    Ovarian cancer Paternal Grandmother        d. 3340s   Heart disease Paternal Grandfather    Breast cancer Other        dx 7240s   Breast cancer Maternal Great-grandmother    Breast cancer Cousin        dx 30s   Colon cancer Neg Hx    Crohn's disease Neg Hx    Esophageal cancer Neg Hx    Rectal cancer Neg Hx    Crystal Mcknight has 1 son,  14 and 1 daughter, 85. She has 1 sister, 60, no cancers.  Ms. Ordner mother had breast cancer at 104 and hysterectomy at 53 for endometriosis, she is living at 27. Maternal grandmother had breast cancer in her 25s. Maternal great aunt had breast cancer in her 63s and died in her 59s. Maternal great grandmother had breast cancer. There is possible Jewish ancestry on this side of the family.  Ms. Tapia father is living at 55 and has had >10 colon polyps. Paternal uncle had kidney cancer. Paternal cousin had breast cancer in her 30s and had bilateral mastectomy. Paternal grandmother died of stomach and ovarian cancer in her 10s.   Ms. Storm is unaware of previous family history of genetic testing for hereditary cancer risks. There is no known consanguinity.    GENETIC COUNSELING ASSESSMENT: Ms. Leick is a 47 y.o.  female with a family history of breast cancer which is somewhat suggestive of a hereditary cancer syndrome and predisposition to cancer. We, therefore, discussed and recommended the following at today's visit.   DISCUSSION: We discussed that approximately 10% of breast cancer is hereditary. Most cases of hereditary breast cancer are associated with BRCA1/BRCA2 genes, although there are other genes associated with hereditary cancer as well. Cancers and risks are gene specific. We discussed that testing is beneficial for several reasons including knowing about cancer risks, identifying potential screening and risk-reduction options that may be appropriate, and to understand if other family members could be at risk for cancer and allow them to undergo genetic testing.   We reviewed the characteristics, features and inheritance patterns of hereditary cancer syndromes. We also discussed genetic testing, including the appropriate family members to test, the process of testing, insurance coverage and turn-around-time for results. We discussed the implications of a negative, positive and/or variant of uncertain significant result. We recommended Crystal Mcknight pursue genetic testing for the [facility] CancerNext-Expanded+RNA gene panel.   Based on Crystal Mcknight family history of cancer, she meets medical criteria for genetic testing. Despite that she meets criteria, she may still have an out of pocket cost. We discussed that if her out of pocket cost for testing is over $100, the laboratory will call and confirm whether she wants to proceed with testing.  If the out of pocket cost of testing is less than $100 she will be billed by the genetic testing laboratory.   We discussed that some people do not want to undergo genetic testing due to fear of genetic discrimination.  A federal law called the Genetic Information Non-Discrimination Act (GINA) of 2008 helps protect individuals against genetic discrimination based on their  genetic test results.  It impacts both health insurance and employment.  For health insurance, it protects against increased premiums, being kicked off insurance or being forced to take a test in order to be insured.  For employment it protects against hiring, firing and promoting decisions based on genetic test results.  Health status due to a cancer diagnosis is not protected under GINA.  This law does not protect life insurance, disability insurance, or other types of insurance.   PLAN: After considering the risks, benefits, and limitations, Ms. Farhan provided informed consent to pursue genetic testing and the blood sample was sent to [facility] for analysis of the CancerNext-Expanded+RNA panel. Results should be available within approximately 2-3 weeks' time, at which point they will be disclosed by telephone to Ms. Basinger, as will any additional recommendations warranted by these results. Ms. Gazzillo will receive a summary  of her genetic counseling visit and a copy of her results once available. This information will also be available in Epic.   Ms. Bellizzi questions were answered to her satisfaction today. Our contact information was provided should additional questions or concerns arise. Thank you for the referral and allowing [place] to share in the care of your patient.   Lacy Duverney, MS, [facility] Genetic Counselor [email or web address] Phone: [phone]  The patient was seen for a total of 25 minutes in face-to-face genetic counseling.  Dr. Orlie Dakin was available for discussion regarding this case.   _______________________________________________________________________ For Office Staff:  Number of people involved in session: 1 Was an Intern/ student involved with case: no

## 2022-09-16 ENCOUNTER — Ambulatory Visit
Admission: RE | Admit: 2022-09-16 | Discharge: 2022-09-16 | Disposition: A | Discharge status: Home / Self Care | Payer: BC Managed Care – PPO | Source: Ambulatory Visit | Attending: Obstetrics & Gynecology | Admitting: Obstetrics & Gynecology

## 2022-09-16 DIAGNOSIS — Z1231 Encounter for screening mammogram for malignant neoplasm of breast: Secondary | ICD-10-CM

## 2022-09-20 DIAGNOSIS — J069 Acute upper respiratory infection, unspecified: Secondary | ICD-10-CM | POA: Diagnosis not present

## 2022-09-20 DIAGNOSIS — R051 Acute cough: Secondary | ICD-10-CM | POA: Diagnosis not present

## 2022-09-21 ENCOUNTER — Other Ambulatory Visit: Payer: Self-pay | Admitting: Obstetrics & Gynecology

## 2022-09-21 DIAGNOSIS — R928 Other abnormal and inconclusive findings on diagnostic imaging of breast: Secondary | ICD-10-CM

## 2022-09-23 ENCOUNTER — Telehealth: Payer: Self-pay | Admitting: Licensed Clinical Social Worker

## 2022-09-23 ENCOUNTER — Encounter: Payer: Self-pay | Admitting: Licensed Clinical Social Worker

## 2022-09-23 ENCOUNTER — Ambulatory Visit: Payer: Self-pay | Admitting: Licensed Clinical Social Worker

## 2022-09-23 DIAGNOSIS — Z1379 Encounter for other screening for genetic and chromosomal anomalies: Secondary | ICD-10-CM | POA: Insufficient documentation

## 2022-09-23 NOTE — Progress Notes (Signed)
HPI:   Ms. Fogelman was previously seen in the [facility] due to a family history of cancer and concerns regarding a hereditary predisposition to cancer. Please refer to our prior cancer genetics clinic note for more information regarding our discussion, assessment and recommendations, at the time. Ms. Wirkkala recent genetic test results were disclosed to her, as were recommendations warranted by these results. These results and recommendations are discussed in more detail below.  CANCER HISTORY:  Oncology History   No history exists.    FAMILY HISTORY:  We obtained a detailed, 4-generation family history.  Significant diagnoses are listed below: Family History  Problem Relation Age of Onset   Breast cancer Mother 27   Colon polyps Mother    Hyperlipidemia Mother    Colon polyps Father        >10   Hyperlipidemia Father    Hypertension Father    Diverticulosis Father    Kidney cancer Paternal Uncle        d.>50   Breast cancer Maternal Grandmother        dx 68s   Heart disease Maternal Grandfather    Stomach cancer Paternal Grandmother    Ovarian cancer Paternal Grandmother        d. 86s   Heart disease Paternal Grandfather    Breast cancer Other        dx 39s   Breast cancer Maternal Great-grandmother    Breast cancer Cousin        dx 30s   Colon cancer Neg Hx    Crohn's disease Neg Hx    Esophageal cancer Neg Hx    Rectal cancer Neg Hx     Ms. Friddle has 1 son, 49 and 1 daughter, 49. She has 1 sister, 31, no cancers.   Ms. Haydon mother had breast cancer at 49 and hysterectomy at 60 for endometriosis, she is living at 41. Maternal grandmother had breast cancer in her 37s. Maternal great aunt had breast cancer in her 30s and died in her 98s. Maternal great grandmother had breast cancer. There is possible Jewish ancestry on this side of the family.   Ms. Leavens father is living at 12 and has had >10 colon polyps. Paternal uncle had kidney  cancer. Paternal cousin had breast cancer in her 30s and had bilateral mastectomy. Paternal grandmother died of stomach and ovarian cancer in her 9s.    Ms. Loria is unaware of previous family history of genetic testing for hereditary cancer risks. There is no known consanguinity.     GENETIC TEST RESULTS:  The [facility] CancerNext-Expanded+RNA Panel found no pathogenic mutations.   The CancerNext-Expanded + RNAinsight gene panel offered by [employer] and includes sequencing and rearrangement analysis for the following 71 genes: AIP, ALK, APC*, ATM*, AXIN2, BAP1, BARD1, BMPR1A, BRCA1*, BRCA2*, BRIP1*, CDC73, CDH1*,CDK4, CDKN1B, CDKN2A, CHEK2*, CTNNA1, DICER1, FH, FLCN, KIF1B, LZTR1, MAX, MEN1, MET, MLH1*, MSH2*, MSH3, MSH6*, MUTYH*, NF1*, NF2, NTHL1, PALB2*, PHOX2B, PMS2*, POT1, PRKAR1A, PTCH1, PTEN*, RAD51C*, RAD51D*,RB1, RET, SDHA, SDHAF2, SDHB, SDHC, SDHD, SMAD4, SMARCA4, SMARCB1, SMARCE1, STK11, SUFU, TMEM127, TP53*,TSC1, TSC2, VHL; EGFR, EGLN1, HOXB13, KIT, MITF, PDGFRA, POLD1 and POLE (sequencing only); EPCAM and GREM1 (deletion/duplication only).  The test report has been scanned into EPIC and is located under the Molecular Pathology section of the Results Review tab.  A portion of the result report is included below for reference. Genetic testing reported out on 09/20/2022.      Even though a pathogenic variant was  not identified, possible explanations for the cancer in the family may include: There may be no hereditary risk for cancer in the family. The cancers in Ms. Reamer and/or her family may be sporadic/familial or due to other genetic and environmental factors. There may be a gene mutation in one of these genes that current testing methods cannot detect but that chance is small. There could be another gene that has not yet been discovered, or that we have not yet tested, that is responsible for the cancer diagnoses in the family.  It is also possible there is a hereditary cause  for the cancer in the family that Ms. Osgood did not inherit.  Therefore, it is important to remain in touch with cancer genetics in the future so that we can continue to offer Ms. Sancho the most up to date genetic testing.   ADDITIONAL GENETIC TESTING:  We discussed with Ms. Arrambide that her genetic testing was fairly extensive.  If there are additional relevant genes identified to increase cancer risk that can be analyzed in the future, we would be happy to discuss and coordinate this testing at that time.    CANCER SCREENING RECOMMENDATIONS:  Ms. Cope test result is considered negative (normal).  This means that we have not identified a hereditary cause for her family history of cancer at this time.   An individual's cancer risk and medical management are not determined by genetic test results alone. Overall cancer risk assessment incorporates additional factors, including personal medical history, family history, and any available genetic information that may result in a personalized plan for cancer prevention and surveillance. Therefore, it is recommended she continue to follow the cancer management and screening guidelines provided by her  primary healthcare provider.  Based on the reported personal and family history, specific cancer screenings for Ms. Otilio Jefferson and her family include:   Breast Cancer Screening:  The Tyrer-Cuzick model is one of multiple prediction models developed to estimate an individual's lifetime risk of developing breast cancer. The Tyrer-Cuzick model is endorsed by the [employer] (NCCN). This model includes many risk factors such as family history, endogenous estrogen exposure, and benign breast disease. The calculation is highly-dependent on the accuracy of clinical data provided by the patient and can change over time. The Tyrer-Cuzick model may be repeated to reflect new information in her personal or family history in the  future.    Ms. Blassingame Tyrer-Cuzick risk score is 43.3%.  For women with a greater than 20% lifetime risk of breast cancer, the NCCN recommends the following:    1.   Clinical encounter every 6-12 months to begin when identified as being at increased risk, but not before age 83    2.   Annual mammograms, tomosynthesis is recommended starting 10 years earlier than the youngest breast cancer diagnosis in the family or at age 40 (whichever comes first), but not before age 46     64.   Annual breast MRI starting 10 years earlier than the youngest breast cancer diagnosis in the family or at age 51 (whichever comes first), but not before age 34   Ms. Fuhrmann is already followed as high risk for breast cancer.    RECOMMENDATIONS FOR FAMILY MEMBERS:   Since she did not inherit a identifiable mutation in a cancer predisposition gene included on this panel, her children could not have inherited a known mutation from her in one of these genes. Individuals in this family might be at some  increased risk of developing cancer, over the general population risk, due to the family history of cancer.  Individuals in the family should notify their providers of the family history of cancer. We recommend women in this family have a yearly mammogram beginning at age 20, or 67 years younger than the earliest onset of cancer, an annual clinical breast exam, and perform monthly breast self-exams.  Family members should have colonoscopies by at age 66, or earlier, as recommended by their providers. Other members of the family may still carry a pathogenic variant in one of these genes that Ms. Duffy did not inherit. Based on the family history, we recommend her maternal and paternal relatives, especially those who have had cancer, have genetic counseling and testing. Ms. Whittlesey will let us know if we can be of any assistance in coordinating genetic counseling and/or testing for this family member.   FOLLOW-UP:  Lastly,  we discussed with Ms. Feider that cancer genetics is a rapidly advancing field and it is possible that new genetic tests will be appropriate for her and/or her family members in the future. We encouraged her to remain in contact with cancer genetics on an annual basis so we can update her personal and family histories and let her know of advances in cancer genetics that may benefit this family.   Our contact number was provided. Ms. Kenedy questions were answered to her satisfaction, and she knows she is welcome to call us at anytime with additional questions or concerns.    Lacy Duverney, MS, [facility] Genetic Counselor [email or web address] Phone: [phone]

## 2022-09-23 NOTE — Telephone Encounter (Signed)
I contacted Ms. Franqui to discuss her genetic testing results. No pathogenic variants were identified in the 71 genes analyzed. Detailed clinic note to follow.   The test report has been scanned into EPIC and is located under the Molecular Pathology section of the Results Review tab.  A portion of the result report is included below for reference.      Lacy Duverney, MS, [facility] Genetic Counselor [email or web address] Phone: [phone]

## 2022-09-24 ENCOUNTER — Encounter (HOSPITAL_BASED_OUTPATIENT_CLINIC_OR_DEPARTMENT_OTHER): Payer: Self-pay | Admitting: Obstetrics & Gynecology

## 2022-10-05 ENCOUNTER — Ambulatory Visit
Admission: RE | Admit: 2022-10-05 | Discharge: 2022-10-05 | Disposition: A | Discharge status: Home / Self Care | Payer: BC Managed Care – PPO | Source: Ambulatory Visit | Attending: Obstetrics & Gynecology | Admitting: Obstetrics & Gynecology

## 2022-10-05 DIAGNOSIS — R928 Other abnormal and inconclusive findings on diagnostic imaging of breast: Secondary | ICD-10-CM

## 2022-10-05 DIAGNOSIS — R9230 Dense breasts, unspecified: Secondary | ICD-10-CM | POA: Diagnosis not present

## 2022-10-05 DIAGNOSIS — R922 Inconclusive mammogram: Secondary | ICD-10-CM | POA: Diagnosis not present

## 2022-10-05 DIAGNOSIS — N6489 Other specified disorders of breast: Secondary | ICD-10-CM | POA: Diagnosis not present

## 2022-10-14 ENCOUNTER — Ambulatory Visit
Admission: RE | Admit: 2022-10-14 | Discharge: 2022-10-14 | Disposition: A | Discharge status: Home / Self Care | Payer: BC Managed Care – PPO | Source: Ambulatory Visit | Attending: Surgery | Admitting: Surgery

## 2022-10-14 DIAGNOSIS — N6489 Other specified disorders of breast: Secondary | ICD-10-CM | POA: Diagnosis not present

## 2022-10-14 DIAGNOSIS — R928 Other abnormal and inconclusive findings on diagnostic imaging of breast: Secondary | ICD-10-CM

## 2022-10-14 DIAGNOSIS — N6089 Other benign mammary dysplasias of unspecified breast: Secondary | ICD-10-CM

## 2022-10-14 DIAGNOSIS — Z803 Family history of malignant neoplasm of breast: Secondary | ICD-10-CM

## 2022-10-14 MED ORDER — GADOPICLENOL 0.5 MMOL/ML IV SOLN
7.0000 mL | Freq: Once | INTRAVENOUS | Status: AC | PRN
  Administered 2022-10-14: 7 mL via INTRAVENOUS

## 2022-10-20 ENCOUNTER — Encounter: Payer: Self-pay | Admitting: Internal Medicine

## 2022-10-20 ENCOUNTER — Other Ambulatory Visit: Payer: Self-pay

## 2022-10-20 DIAGNOSIS — E039 Hypothyroidism, unspecified: Secondary | ICD-10-CM

## 2022-10-20 MED ORDER — LEVOTHYROXINE SODIUM 175 MCG PO TABS: ORAL_TABLET | ORAL | 3 refills | Status: DC

## 2022-12-07 ENCOUNTER — Ambulatory Visit (HOSPITAL_BASED_OUTPATIENT_CLINIC_OR_DEPARTMENT_OTHER): Payer: BC Managed Care – PPO | Admitting: Obstetrics & Gynecology

## 2022-12-07 ENCOUNTER — Encounter (HOSPITAL_BASED_OUTPATIENT_CLINIC_OR_DEPARTMENT_OTHER): Payer: Self-pay | Admitting: Obstetrics & Gynecology

## 2022-12-07 VITALS — BP 121/70 | HR 77 | Ht 67.0 in | Wt 142.0 lb

## 2022-12-07 DIAGNOSIS — R102 Pelvic and perineal pain: Secondary | ICD-10-CM

## 2022-12-07 DIAGNOSIS — Z3009 Encounter for other general counseling and advice on contraception: Secondary | ICD-10-CM | POA: Diagnosis not present

## 2022-12-07 DIAGNOSIS — Z803 Family history of malignant neoplasm of breast: Secondary | ICD-10-CM | POA: Diagnosis not present

## 2022-12-07 DIAGNOSIS — Z8742 Personal history of other diseases of the female genital tract: Secondary | ICD-10-CM

## 2022-12-10 NOTE — Progress Notes (Signed)
GYNECOLOGY  VISIT  CC:   discuss surgery  HPI: 47 y.o. [id number] Married White or Caucasian female here for discussion of surgery.  Pt initially see on 06/24/2022 to discuss possible repeat laparoscopy.  Had prior laparoscopy in 2015 and endometriosis was seen and treated.  Feels she did really well and has really been pain free until this past year.  Has considered hysterectomy as well.  Did genetic testing after visit in February due to family hx of gyn related cancers. This was all negative.  She is pleased with this but this didn't help in her decision making.  We discussed laparoscopy with treatment of endometriosis including surgery and recovery as well as hysterectomy and recovery.  She does desire a shorter surgery.  We discussed removal of fallopian tubes for reduction of ovarian cancer and permanent contraception.  She does want to have this done as well.  She understands significant endometriosis could be present and this could ultimately change recommendations.  She is comfortable with just proceeding with laparoscopy with bilateral salpingectomy.     Past Medical History:  Diagnosis Date   Abnormal Pap smear of cervix    Arthritis    hands,pt denies 11/11/21   Depression    Hypothyroidism    Pelvic pain    PONV (postoperative nausea and vomiting)    Substance abuse (HCC)    ETOH,RECOVERY FOR 4 YEARS UPDATED 11/11/21   Thyroid disease     MEDS:   Current Outpatient Medications on File Prior to Visit  Medication Sig Dispense Refill   B Complex-C (B-COMPLEX WITH VITAMIN C) tablet B Complex     fluticasone (FLONASE) 50 MCG/ACT nasal spray Place into both nostrils daily.     levothyroxine (SYNTHROID) 175 MCG tablet TAKE 1 TABLET(175 MCG) BY MOUTH EVERY DAY 45 tablet 3   Multiple Vitamins-Minerals (MULTIVITAMIN ADULT EXTRA C PO) daily.     sertraline (ZOLOFT) 50 MG tablet Take 50 mg by mouth daily.     traZODone (DESYREL) 50 MG tablet Take at bedtime as directed 90 tablet 1   No  current facility-administered medications on file prior to visit.    ALLERGIES: Penicillins  SH:  married, non smoker  Review of Systems  Constitutional: Negative.   Genitourinary: Negative.     PHYSICAL EXAMINATION:    BP 121/70 (BP Location: Right Arm, Patient Position: Sitting, Cuff Size: Normal)   Pulse 77   Ht 5\' 7"  (1.702 m) Comment: Reported  Wt 142 lb (64.4 kg)   LMP 11/07/2022   BMI 22.24 kg/m     Physical Exam Constitutional:      Appearance: Normal appearance.  Neurological:     General: No focal deficit present.     Mental Status: She is alert.  Psychiatric:        Mood and Affect: Mood normal.        Behavior: Behavior normal.     Assessment/Plan: 1. Pelvic pain - will proceed with scheduling laparoscopy with possible treatment of endometriosis and bilateral salpingectomy.  Information will be sent to surgery scheduler.  2. History of endometriosis  3. Family history of breast cancer - had negative genetic testing  4. Sterilization consult

## 2022-12-14 DIAGNOSIS — L237 Allergic contact dermatitis due to plants, except food: Secondary | ICD-10-CM | POA: Diagnosis not present

## 2022-12-14 DIAGNOSIS — Z6822 Body mass index (BMI) 22.0-22.9, adult: Secondary | ICD-10-CM | POA: Diagnosis not present

## 2022-12-21 ENCOUNTER — Telehealth: Payer: Self-pay

## 2022-12-21 NOTE — Telephone Encounter (Signed)
Called patient to schedule her procedure w/ Dr. Hyacinth Meeker. Left message advising that Dr. Hyacinth Meeker first available was 01/25/23 and to return my call for scheduling. [phone].

## 2023-01-07 ENCOUNTER — Telehealth: Payer: Self-pay

## 2023-01-07 NOTE — Telephone Encounter (Signed)
Called patient to schedule procedure with Dr. Hyacinth Meeker. Patient chose to have procedure on 02/02/23 at [facility], 9 am. Patient is aware she must arrive at 7 am. Pre-Op instructions were provided by phone.

## 2023-01-10 ENCOUNTER — Other Ambulatory Visit: Payer: Self-pay

## 2023-01-10 DIAGNOSIS — E039 Hypothyroidism, unspecified: Secondary | ICD-10-CM

## 2023-01-10 MED ORDER — LEVOTHYROXINE SODIUM 175 MCG PO TABS: ORAL_TABLET | ORAL | 3 refills | Status: DC

## 2023-01-11 ENCOUNTER — Telehealth: Payer: Self-pay

## 2023-01-12 NOTE — Telephone Encounter (Signed)
Patient called to have her surgery date moved to 02/21/23. States her husband was out of town on 02/02/23. Surgery moved to 02/21/23 and new surgery time is 7:30 am and patient is aware she must arrive at 5:30 am. Location remains the same.

## 2023-01-14 ENCOUNTER — Other Ambulatory Visit: Payer: BC Managed Care – PPO

## 2023-01-14 DIAGNOSIS — E039 Hypothyroidism, unspecified: Secondary | ICD-10-CM | POA: Diagnosis not present

## 2023-01-14 LAB — T4, FREE: Free T4: 1.12 ng/dL (ref 0.60–1.60)

## 2023-01-14 LAB — TSH: TSH: 2.54 u[IU]/mL (ref 0.35–5.50)

## 2023-01-21 ENCOUNTER — Encounter (HOSPITAL_BASED_OUTPATIENT_CLINIC_OR_DEPARTMENT_OTHER): Payer: Self-pay

## 2023-02-03 ENCOUNTER — Other Ambulatory Visit (HOSPITAL_BASED_OUTPATIENT_CLINIC_OR_DEPARTMENT_OTHER): Payer: Self-pay | Admitting: Obstetrics & Gynecology

## 2023-02-03 DIAGNOSIS — Z01818 Encounter for other preprocedural examination: Secondary | ICD-10-CM

## 2023-02-17 ENCOUNTER — Encounter (HOSPITAL_BASED_OUTPATIENT_CLINIC_OR_DEPARTMENT_OTHER): Payer: Self-pay | Admitting: Obstetrics & Gynecology

## 2023-02-17 ENCOUNTER — Telehealth: Payer: Self-pay

## 2023-02-17 DIAGNOSIS — D2262 Melanocytic nevi of left upper limb, including shoulder: Secondary | ICD-10-CM | POA: Diagnosis not present

## 2023-02-17 DIAGNOSIS — D2261 Melanocytic nevi of right upper limb, including shoulder: Secondary | ICD-10-CM | POA: Diagnosis not present

## 2023-02-17 DIAGNOSIS — L821 Other seborrheic keratosis: Secondary | ICD-10-CM | POA: Diagnosis not present

## 2023-02-17 DIAGNOSIS — D225 Melanocytic nevi of trunk: Secondary | ICD-10-CM | POA: Diagnosis not present

## 2023-02-17 NOTE — Telephone Encounter (Signed)
Called patient to notify her that Dr.Miller schedule has changed for 02/21/23 and reschedule her surgery. Patient chose to have procedure on 03/01/23 at [facility] at 11:30 am. Patient is aware she must arrive at 9:30 am.

## 2023-02-21 ENCOUNTER — Ambulatory Visit (HOSPITAL_BASED_OUTPATIENT_CLINIC_OR_DEPARTMENT_OTHER): Admit: 2023-02-21 | Payer: BC Managed Care – PPO | Admitting: Obstetrics & Gynecology

## 2023-02-21 ENCOUNTER — Encounter (HOSPITAL_BASED_OUTPATIENT_CLINIC_OR_DEPARTMENT_OTHER): Payer: Self-pay

## 2023-02-21 SURGERY — LAPAROSCOPIC EXCISION OF ENDOMETRIOSIS
Anesthesia: Choice

## 2023-02-22 NOTE — Progress Notes (Signed)
Surgical Instructions   Your procedure is scheduled on Tuesday March 01, 2023. Report to [facility] Main Entrance "A" at 8:30 A.M., then check in with the Admitting office. Any questions or running late day of surgery: call [phone]  Questions prior to your surgery date: call [phone], Monday-Friday, 8am-4pm. If you experience any cold or flu symptoms such as cough, fever, chills, shortness of breath, etc. between now and your scheduled surgery, please notify us at the above number.     Remember:  Do not eat after midnight the night before your surgery   You may drink clear liquids until 7:30 the morning of your surgery.   Clear liquids allowed are: Water, Non-Citrus Juices (without pulp), Carbonated Beverages, Clear Tea, Black Coffee Only (NO MILK, CREAM OR POWDERED CREAMER of any kind), and Gatorade.  Patient Instructions  The night before surgery:  No food after midnight. ONLY clear liquids after midnight  The day of surgery (if you do NOT have diabetes):  Drink ONE (1) Pre-Surgery Clear Ensure by 7:30 the morning of surgery. Drink in one sitting. Do not sip.  This drink was given to you during your hospital  pre-op appointment visit.  Nothing else to drink after completing the  Pre-Surgery Clear Ensure.         If you have questions, please contact your surgeon's office.     Take these medicines the morning of surgery with A SIP OF WATER  levothyroxine (SYNTHROID)  sertraline (ZOLOFT)   May take these medicines IF NEEDED: fluticasone (FLONASE)  loratadine (CLARITIN)    One week prior to surgery, STOP taking any Aspirin (unless otherwise instructed by your surgeon) Aleve, Naproxen, Ibuprofen, Motrin, Advil, Goody's, BC's, all herbal medications, fish oil, and non-prescription vitamins.                     Do NOT Smoke (Tobacco/Vaping) for 24 hours prior to your procedure.  If you use a CPAP at night, you may bring your mask/headgear for your overnight  stay.   You will be asked to remove any contacts, glasses, piercing's, hearing aid's, dentures/partials prior to surgery. Please bring cases for these items if needed.    Patients discharged the day of surgery will not be allowed to drive home, and someone needs to stay with them for 24 hours.  SURGICAL WAITING ROOM VISITATION Patients may have no more than 2 support people in the waiting area - these visitors may rotate.   Pre-op nurse will coordinate an appropriate time for 1 ADULT support person, who may not rotate, to accompany patient in pre-op.  Children under the age of 51 must have an adult with them who is not the patient and must remain in the main waiting area with an adult.  If the patient needs to stay at the hospital during part of their recovery, the visitor guidelines for inpatient rooms apply.  Please refer to the [facility] website for the visitor guidelines for any additional information.   If you received a COVID test during your pre-op visit  it is requested that you wear a mask when out in public, stay away from anyone that may not be feeling well and notify your surgeon if you develop symptoms. If you have been in contact with anyone that has tested positive in the last 10 days please notify you surgeon.      Pre-operative CHG Bathing Instructions   You can play a key role in reducing the risk of  infection after surgery. Your skin needs to be as free of germs as possible. You can reduce the number of germs on your skin by washing with CHG (chlorhexidine gluconate) soap before surgery. CHG is an antiseptic soap that kills germs and continues to kill germs even after washing.   DO NOT use if you have an allergy to chlorhexidine/CHG or antibacterial soaps. If your skin becomes reddened or irritated, stop using the CHG and notify one of our RNs at [phone].              TAKE A SHOWER THE NIGHT BEFORE SURGERY AND THE DAY OF SURGERY    Please keep in mind the  following:  DO NOT shave, including legs and underarms, 48 hours prior to surgery.   You may shave your face before/day of surgery.  Place clean sheets on your bed the night before surgery Use a clean washcloth (not used since being washed) for each shower. DO NOT sleep with pet's night before surgery.  CHG Shower Instructions:  Wash your face and private area with normal soap. If you choose to wash your hair, wash first with your normal shampoo.  After you use shampoo/soap, rinse your hair and body thoroughly to remove shampoo/soap residue.  Turn the water OFF and apply half the bottle of CHG soap to a CLEAN washcloth.  Apply CHG soap ONLY FROM YOUR NECK DOWN TO YOUR TOES (washing for 3-5 minutes)  DO NOT use CHG soap on face, private areas, open wounds, or sores.  Pay special attention to the area where your surgery is being performed.  If you are having back surgery, having someone wash your back for you may be helpful. Wait 2 minutes after CHG soap is applied, then you may rinse off the CHG soap.  Pat dry with a clean towel  Put on clean pajamas    Additional instructions for the day of surgery: DO NOT APPLY any lotions, deodorants or perfumes.   Do not wear jewelry or makeup Do not wear nail polish, gel polish, artificial nails, or any other type of covering on natural nails (fingers and toes) Do not bring valuables to the hospital. [facility] is not responsible for valuables/personal belongings. Put on clean/comfortable clothes.  Please brush your teeth.  Ask your nurse before applying any prescription medications to the skin.

## 2023-02-23 ENCOUNTER — Other Ambulatory Visit: Payer: Self-pay

## 2023-02-23 ENCOUNTER — Encounter (HOSPITAL_COMMUNITY): Payer: Self-pay

## 2023-02-23 ENCOUNTER — Encounter (HOSPITAL_COMMUNITY)
Admission: RE | Admit: 2023-02-23 | Discharge: 2023-02-23 | Disposition: A | Discharge status: Home / Self Care | Payer: BC Managed Care – PPO | Source: Ambulatory Visit | Attending: Obstetrics & Gynecology | Admitting: Obstetrics & Gynecology

## 2023-02-23 VITALS — BP 103/68 | HR 80 | Temp 98.1°F | Resp 18 | Ht 67.0 in | Wt 143.7 lb

## 2023-02-23 DIAGNOSIS — Z01812 Encounter for preprocedural laboratory examination: Secondary | ICD-10-CM | POA: Diagnosis not present

## 2023-02-23 DIAGNOSIS — Z01818 Encounter for other preprocedural examination: Secondary | ICD-10-CM

## 2023-02-23 LAB — TYPE AND SCREEN
ABO/RH(D): O NEG
Antibody Screen: NEGATIVE

## 2023-02-23 LAB — CBC
HCT: 40.7 % (ref 36.0–46.0)
Hemoglobin: 13.9 g/dL (ref 12.0–15.0)
MCH: 30.9 pg (ref 26.0–34.0)
MCHC: 34.2 g/dL (ref 30.0–36.0)
MCV: 90.4 fL (ref 80.0–100.0)
Platelets: 200 10*3/uL (ref 150–400)
RBC: 4.5 MIL/uL (ref 3.87–5.11)
RDW: 12.7 % (ref 11.5–15.5)
WBC: 7.2 10*3/uL (ref 4.0–10.5)
nRBC: 0 % (ref 0.0–0.2)

## 2023-02-23 NOTE — Progress Notes (Signed)
Spoke  with Dr. Okey Dupre. Per anesthesia protocol,since pt is having periods, she should have a urine pregnancy test the day of surgery. Order placed in chart. [employer] sent to Dr. Hyacinth Meeker asking for clarification of consent order which does not match surgery posting.

## 2023-02-23 NOTE — Progress Notes (Signed)
PCP - Cletis [place] Cardiologist - denies  PPM/ICD - denies Device Orders -  Rep Notified -   Chest x-ray - na EKG - na Stress Test - 2017 ECHO - 2017  Cardiac Cath - denies  Sleep Study - denies CPAP - no  Fasting Blood Sugar - na Checks Blood Sugar _____ times a day  Last dose of GLP1 agonist-  na GLP1 instructions:   Blood Thinner Instructions:na Aspirin Instructions:na  ERAS Protcol -clear liquids until 0730 PRE-SURGERY Ensure or G2- Ensure  COVID TEST- na   Anesthesia review: no  Patient denies shortness of breath, fever, cough and chest pain at PAT appointment   All instructions explained to the patient, with a verbal understanding of the material. Patient agrees to go over the instructions while at home for a better understanding. Patient also instructed to wear a mask when out in public prior to surgery . The opportunity to ask questions was provided.

## 2023-02-23 NOTE — Progress Notes (Signed)
Left Message for Crystal Mcknight asking for clarification of consent order. Consent order does not match posting.

## 2023-02-23 NOTE — Progress Notes (Signed)
Left message at Dr. Rondel Baton office requesting clarification for consent order. Consent order does not match posting.

## 2023-02-24 ENCOUNTER — Other Ambulatory Visit (HOSPITAL_BASED_OUTPATIENT_CLINIC_OR_DEPARTMENT_OTHER): Payer: Self-pay | Admitting: Obstetrics & Gynecology

## 2023-03-01 ENCOUNTER — Ambulatory Visit (HOSPITAL_COMMUNITY): Payer: BC Managed Care – PPO | Admitting: Vascular Surgery

## 2023-03-01 ENCOUNTER — Ambulatory Visit (HOSPITAL_COMMUNITY): Payer: BC Managed Care – PPO | Admitting: Anesthesiology

## 2023-03-01 ENCOUNTER — Ambulatory Visit (HOSPITAL_COMMUNITY)
Admission: RE | Admit: 2023-03-01 | Discharge: 2023-03-01 | Disposition: A | Discharge status: Home / Self Care | Payer: BC Managed Care – PPO | Attending: Obstetrics & Gynecology | Admitting: Obstetrics & Gynecology

## 2023-03-01 ENCOUNTER — Other Ambulatory Visit: Payer: Self-pay

## 2023-03-01 ENCOUNTER — Encounter (HOSPITAL_COMMUNITY)
Admission: RE | Disposition: A | Discharge status: Home / Self Care | Payer: Self-pay | Source: Home / Self Care | Attending: Obstetrics & Gynecology

## 2023-03-01 DIAGNOSIS — N803 Endometriosis of pelvic peritoneum, unspecified: Secondary | ICD-10-CM

## 2023-03-01 DIAGNOSIS — N803C1 Endometriosis of the right uterosacral ligament, unspecified depth: Secondary | ICD-10-CM | POA: Insufficient documentation

## 2023-03-01 DIAGNOSIS — Z7722 Contact with and (suspected) exposure to environmental tobacco smoke (acute) (chronic): Secondary | ICD-10-CM | POA: Diagnosis not present

## 2023-03-01 DIAGNOSIS — N8000 Endometriosis of the uterus, unspecified: Secondary | ICD-10-CM

## 2023-03-01 DIAGNOSIS — N80399 Endometriosis of the pelvic peritoneum, other specified sites, unspecified depth: Secondary | ICD-10-CM | POA: Diagnosis not present

## 2023-03-01 DIAGNOSIS — Z01818 Encounter for other preprocedural examination: Secondary | ICD-10-CM

## 2023-03-01 DIAGNOSIS — R102 Pelvic and perineal pain: Secondary | ICD-10-CM | POA: Diagnosis not present

## 2023-03-01 DIAGNOSIS — E039 Hypothyroidism, unspecified: Secondary | ICD-10-CM | POA: Insufficient documentation

## 2023-03-01 DIAGNOSIS — F419 Anxiety disorder, unspecified: Secondary | ICD-10-CM | POA: Insufficient documentation

## 2023-03-01 DIAGNOSIS — F32A Depression, unspecified: Secondary | ICD-10-CM | POA: Insufficient documentation

## 2023-03-01 DIAGNOSIS — Z302 Encounter for sterilization: Secondary | ICD-10-CM

## 2023-03-01 HISTORY — PX: LAPAROSCOPIC BILATERAL SALPINGECTOMY: SHX5889

## 2023-03-01 LAB — POCT PREGNANCY, URINE: Preg Test, Ur: NEGATIVE

## 2023-03-01 LAB — ABO/RH: ABO/RH(D): O NEG

## 2023-03-01 SURGERY — EXCISION, ENDOMETRIOSIS, LAPAROSCOPIC
Anesthesia: General

## 2023-03-01 MED ORDER — LIDOCAINE 2% (20 MG/ML) 5 ML SYRINGE
INTRAMUSCULAR | Status: AC
  Filled 2023-03-01: qty 5

## 2023-03-01 MED ORDER — PROPOFOL 10 MG/ML IV BOLUS
INTRAVENOUS | Status: DC | PRN
  Administered 2023-03-01: 200 mg via INTRAVENOUS
  Administered 2023-03-01: 150 ug/kg/min via INTRAVENOUS

## 2023-03-01 MED ORDER — ACETAMINOPHEN 500 MG PO TABS
1000.0000 mg | ORAL_TABLET | Freq: Once | ORAL | Status: AC
  Administered 2023-03-01: 1000 mg via ORAL
  Filled 2023-03-01: qty 2

## 2023-03-01 MED ORDER — SILVER NITRATE-POT NITRATE 75-25 % EX MISC
CUTANEOUS | Status: AC
  Filled 2023-03-01: qty 10

## 2023-03-01 MED ORDER — EPHEDRINE SULFATE-NACL 50-0.9 MG/10ML-% IV SOSY: PREFILLED_SYRINGE | INTRAVENOUS | Status: DC | PRN

## 2023-03-01 MED ORDER — ONDANSETRON HCL 4 MG/2ML IJ SOLN
INTRAMUSCULAR | Status: DC | PRN
  Administered 2023-03-01: 4 mg via INTRAVENOUS

## 2023-03-01 MED ORDER — ROCURONIUM BROMIDE 10 MG/ML (PF) SYRINGE
PREFILLED_SYRINGE | INTRAVENOUS | Status: DC | PRN
  Administered 2023-03-01: 80 mg via INTRAVENOUS

## 2023-03-01 MED ORDER — PHENYLEPHRINE HCL-NACL 20-0.9 MG/250ML-% IV SOLN
INTRAVENOUS | Status: DC | PRN
  Administered 2023-03-01: 50 ug/min via INTRAVENOUS

## 2023-03-01 MED ORDER — DEXAMETHASONE SODIUM PHOSPHATE 10 MG/ML IJ SOLN
INTRAMUSCULAR | Status: DC | PRN
  Administered 2023-03-01: 10 mg via INTRAVENOUS

## 2023-03-01 MED ORDER — CHLORHEXIDINE GLUCONATE 0.12 % MT SOLN
OROMUCOSAL | Status: AC
  Administered 2023-03-01: 15 mL via OROMUCOSAL
  Filled 2023-03-01: qty 15

## 2023-03-01 MED ORDER — POVIDONE-IODINE 10 % EX SWAB
2.0000 | Freq: Once | CUTANEOUS | Status: AC
  Administered 2023-03-01: 2 via TOPICAL

## 2023-03-01 MED ORDER — KETOROLAC TROMETHAMINE 30 MG/ML IJ SOLN: 30.0000 mg | Freq: Once | INTRAMUSCULAR | Status: DC | PRN

## 2023-03-01 MED ORDER — OXYCODONE HCL 5 MG/5ML PO SOLN: 5.0000 mg | Freq: Once | ORAL | Status: DC | PRN

## 2023-03-01 MED ORDER — ROCURONIUM BROMIDE 10 MG/ML (PF) SYRINGE
PREFILLED_SYRINGE | INTRAVENOUS | Status: AC
  Filled 2023-03-01: qty 10

## 2023-03-01 MED ORDER — MIDAZOLAM HCL 2 MG/2ML IJ SOLN
INTRAMUSCULAR | Status: DC | PRN
  Administered 2023-03-01: 2 mg via INTRAVENOUS

## 2023-03-01 MED ORDER — BUPIVACAINE HCL (PF) 0.25 % IJ SOLN
INTRAMUSCULAR | Status: DC | PRN
  Administered 2023-03-01: 8 mL

## 2023-03-01 MED ORDER — PROPOFOL 10 MG/ML IV BOLUS
INTRAVENOUS | Status: AC
  Filled 2023-03-01: qty 20

## 2023-03-01 MED ORDER — KETAMINE HCL 10 MG/ML IJ SOLN
INTRAMUSCULAR | Status: DC | PRN
Start: 2023-03-01 — End: 2023-03-01
  Administered 2023-03-01: 20 mg via INTRAVENOUS

## 2023-03-01 MED ORDER — HYDROMORPHONE HCL 1 MG/ML IJ SOLN
INTRAMUSCULAR | Status: AC
  Filled 2023-03-01: qty 1

## 2023-03-01 MED ORDER — HYDROCODONE-ACETAMINOPHEN 5-325 MG PO TABS
1.0000 | ORAL_TABLET | Freq: Four times a day (QID) | ORAL | 0 refills | Status: DC | PRN
Start: 2023-03-01 — End: 2023-12-04

## 2023-03-01 MED ORDER — SCOPOLAMINE 1 MG/3DAYS TD PT72
1.0000 | MEDICATED_PATCH | TRANSDERMAL | Status: DC
  Administered 2023-03-01: 1.5 mg via TRANSDERMAL
  Filled 2023-03-01: qty 1

## 2023-03-01 MED ORDER — ORAL CARE MOUTH RINSE: 15.0000 mL | Freq: Once | OROMUCOSAL | Status: AC

## 2023-03-01 MED ORDER — LACTATED RINGERS IV SOLN
INTRAVENOUS | Status: DC | PRN
Start: 2023-03-01 — End: 2023-03-01

## 2023-03-01 MED ORDER — AMISULPRIDE (ANTIEMETIC) 5 MG/2ML IV SOLN: 10.0000 mg | Freq: Once | INTRAVENOUS | Status: DC | PRN

## 2023-03-01 MED ORDER — FENTANYL CITRATE (PF) 250 MCG/5ML IJ SOLN
INTRAMUSCULAR | Status: DC | PRN
  Administered 2023-03-01: 100 ug via INTRAVENOUS
  Administered 2023-03-01: 25 ug via INTRAVENOUS

## 2023-03-01 MED ORDER — IBUPROFEN 800 MG PO TABS: 800.0000 mg | ORAL_TABLET | Freq: Three times a day (TID) | ORAL | 0 refills | Status: DC | PRN

## 2023-03-01 MED ORDER — KETOROLAC TROMETHAMINE 30 MG/ML IJ SOLN
INTRAMUSCULAR | Status: AC
  Filled 2023-03-01: qty 1

## 2023-03-01 MED ORDER — HYDROMORPHONE HCL 1 MG/ML IJ SOLN
0.2500 mg | INTRAMUSCULAR | Status: DC | PRN
  Administered 2023-03-01 (×2): 0.5 mg via INTRAVENOUS

## 2023-03-01 MED ORDER — MIDAZOLAM HCL 2 MG/2ML IJ SOLN
INTRAMUSCULAR | Status: AC
  Filled 2023-03-01: qty 2

## 2023-03-01 MED ORDER — OXYCODONE HCL 5 MG PO TABS: 5.0000 mg | ORAL_TABLET | Freq: Once | ORAL | Status: DC | PRN

## 2023-03-01 MED ORDER — SUGAMMADEX SODIUM 200 MG/2ML IV SOLN
INTRAVENOUS | Status: DC | PRN
  Administered 2023-03-01 (×2): 100 mg via INTRAVENOUS

## 2023-03-01 MED ORDER — MEPERIDINE HCL 25 MG/ML IJ SOLN: 6.2500 mg | INTRAMUSCULAR | Status: DC | PRN

## 2023-03-01 MED ORDER — LACTATED RINGERS IV SOLN: INTRAVENOUS | Status: DC

## 2023-03-01 MED ORDER — ONDANSETRON HCL 4 MG/2ML IJ SOLN
INTRAMUSCULAR | Status: AC
  Filled 2023-03-01: qty 2

## 2023-03-01 MED ORDER — LIDOCAINE 2% (20 MG/ML) 5 ML SYRINGE
INTRAMUSCULAR | Status: DC | PRN
  Administered 2023-03-01: 60 mg via INTRAVENOUS

## 2023-03-01 MED ORDER — DEXAMETHASONE SODIUM PHOSPHATE 10 MG/ML IJ SOLN
INTRAMUSCULAR | Status: AC
  Filled 2023-03-01: qty 1

## 2023-03-01 MED ORDER — PHENYLEPHRINE 80 MCG/ML (10ML) SYRINGE FOR IV PUSH (FOR BLOOD PRESSURE SUPPORT)
PREFILLED_SYRINGE | INTRAVENOUS | Status: DC | PRN
  Administered 2023-03-01 (×2): 160 ug via INTRAVENOUS

## 2023-03-01 MED ORDER — FENTANYL CITRATE (PF) 250 MCG/5ML IJ SOLN
INTRAMUSCULAR | Status: AC
  Filled 2023-03-01: qty 5

## 2023-03-01 MED ORDER — ONDANSETRON HCL 4 MG/2ML IJ SOLN
4.0000 mg | Freq: Once | INTRAMUSCULAR | Status: AC | PRN
  Administered 2023-03-01: 4 mg via INTRAVENOUS

## 2023-03-01 MED ORDER — KETOROLAC TROMETHAMINE 30 MG/ML IJ SOLN
INTRAMUSCULAR | Status: DC | PRN
Start: 2023-03-01 — End: 2023-03-01
  Administered 2023-03-01: 30 mg via INTRAVENOUS

## 2023-03-01 MED ORDER — BUPIVACAINE HCL (PF) 0.25 % IJ SOLN
INTRAMUSCULAR | Status: AC
  Filled 2023-03-01: qty 30

## 2023-03-01 MED ORDER — CHLORHEXIDINE GLUCONATE 0.12 % MT SOLN: 15.0000 mL | Freq: Once | OROMUCOSAL | Status: AC

## 2023-03-01 MED ORDER — KETAMINE HCL 50 MG/5ML IJ SOSY
PREFILLED_SYRINGE | INTRAMUSCULAR | Status: AC
  Filled 2023-03-01: qty 5

## 2023-03-01 SURGICAL SUPPLY — 24 items
ADH SKN CLS APL DERMABOND .7 (GAUZE/BANDAGES/DRESSINGS) ×2
DERMABOND ADVANCED .7 DNX12 (GAUZE/BANDAGES/DRESSINGS) IMPLANT
DURAPREP 26ML APPLICATOR (WOUND CARE) ×2 IMPLANT
GLOVE BIOGEL PI IND STRL 7.0 (GLOVE) ×8 IMPLANT
GLOVE ECLIPSE 6.5 STRL STRAW (GLOVE) ×2 IMPLANT
GOWN STRL REUS W/ TWL LRG LVL3 (GOWN DISPOSABLE) ×2 IMPLANT
GOWN STRL REUS W/TWL LRG LVL3 (GOWN DISPOSABLE) ×2
KIT PINK PAD W/HEAD ARE REST (MISCELLANEOUS) ×2
KIT PINK PAD W/HEAD ARM REST (MISCELLANEOUS) ×2 IMPLANT
KIT TURNOVER KIT B (KITS) ×2 IMPLANT
NDL INSUFFLATION 14GA 120MM (NEEDLE) ×2 IMPLANT
NEEDLE INSUFFLATION 14GA 120MM (NEEDLE) ×2
PACK LAPAROSCOPY BASIN (CUSTOM PROCEDURE TRAY) ×2 IMPLANT
PROTECTOR NERVE ULNAR (MISCELLANEOUS) ×4 IMPLANT
SET TUBE SMOKE EVAC HIGH FLOW (TUBING) ×2 IMPLANT
SHEARS HARMONIC ACE PLUS 36CM (ENDOMECHANICALS) IMPLANT
SLEEVE ADV FIXATION 5X100MM (TROCAR) IMPLANT
SUT VICRYL 4-0 PS2 18IN ABS (SUTURE) ×2 IMPLANT
SYR 30ML LL (SYRINGE) IMPLANT
TOWEL GREEN STERILE FF (TOWEL DISPOSABLE) ×4 IMPLANT
TRAY FOLEY W/BAG SLVR 14FR (SET/KITS/TRAYS/PACK) ×2 IMPLANT
TROCAR ADV FIXATION 5X100MM (TROCAR) ×2 IMPLANT
TROCAR XCEL NON-BLD 5MMX100MML (ENDOMECHANICALS) ×2 IMPLANT
WARMER LAPAROSCOPE (MISCELLANEOUS) ×2 IMPLANT

## 2023-03-01 NOTE — Discharge Instructions (Signed)
Post-surgical Instructions, Outpatient Surgery ° °You may expect to feel dizzy, weak, and drowsy for as long as 24 hours after receiving the medicine that made you sleep (anesthetic). For the first 24 hours after your surgery:   °Do not drive a car, ride a bicycle, participate in physical activities, or take public transportation until you are done taking narcotic pain medicines or as directed by Crystal Mcknight.  °Do not drink alcohol or take tranquilizers.  °Do not take medicine that has not been prescribed by your physicians.  °Do not sign important papers or make important decisions while on narcotic pain medicines.  °Have a responsible person with you.  ° °CARE OF INCISION °If you have a bandage, you may remove it in one day.  If there are steri-strips or dermabond, just let this loosen on its own.  °You may shower on the first day after your surgery.  Do not sit in a tub bath for one week. °Avoid heavy lifting (more than 10 pounds/4.5 kilograms), pushing, or pulling.  °Avoid activities that may risk injury to your incisions.  ° °PAIN MANAGEMENT °Motrin 800mg.  (This is the same as 4-200mg over the counter tablets of Motrin or ibuprofen.)  You may take this every eight hours or as needed for cramping.   °Vicodin 5/325mg.  For more severe pain, take one or two tablets every four to six hours as needed for pain control.  (Remember that narcotic pain medications increase your risk of constipation.  If this becomes a problem, you may take an over the counter stool softener like Colace 100mg up to four times a day.) ° °DO'S AND DON'T'S °Do not take a tub bath for one week.  You may shower on the first day after your surgery °Do not do any heavy lifting for one to two weeks.  This increases the chance of bleeding. °Do move around as you feel able.  Stairs are fine.  You may begin to exercise again as you feel able.  Do not lift any weights for two weeks. °Do not put anything in the vagina for two weeks--no tampons,  intercourse, or douching.   ° °REGULAR MEDIATIONS/VITAMINS: °You may restart all of your regular medications as prescribed. °You may restart all of your vitamins as you normally take them.   ° °PLEASE CALL OR SEEK MEDICAL CARE IF: °You have persistent nausea and vomiting.  °You have trouble eating or drinking.  °You have an oral temperature above 100.5.  °You have constipation that is not helped by adjusting diet or increasing fluid intake. Pain medicines are a common cause of constipation.  °You have heavy vaginal bleeding °You have redness or drainage from your incision(s) or there is increasing pain or tenderness near or in the surgical site.  °  °

## 2023-03-01 NOTE — H&P (Signed)
Crystal Mcknight is an 47 y.o. female G12P2A1 MWF with hx of endometriosis and pelvic pain here for laparoscopic evaluation and bilateral salpingectomy for permanent contraception.  She has hx of prior laparoscopy in 2015 and had treatment of endometriosis at that time.  She was pain free until the last year or so.  Pain is similar to prior endometriosis pain.  She has contemplated hysterectomy as well.  Did undergo genetic testing which was negative.  Currently desires repeat evaluation for endometriosis and bilateral salpingectomy.  Procedure, risks, benefits and alternatives have been discussed in the office.  Reviewed procedure with pt today.  Questions answered.    Pertinent Gynecological History: Menses:  regular Contraception: none DES exposure: denies Blood transfusions: none Sexually transmitted diseases: no past history Previous GYN Procedures:  laparoscopy   Last mammogram: normal Date: 10/05/2022 Last pap: normal Date: 03/11/2022, is in [facility] History: G3, P2   Menstrual History: Patient's last menstrual period was 01/28/2023.    Past Medical History:  Diagnosis Date   Abnormal Pap smear of cervix    Arthritis    hands,pt denies 11/11/21   Depression    Hypothyroidism    Pelvic pain    PONV (postoperative nausea and vomiting)    Substance abuse (HCC)    ETOH,RECOVERY FOR 4 YEARS UPDATED 11/11/21   Thyroid disease     Past Surgical History:  Procedure Laterality Date   APPENDECTOMY     BREAST LUMPECTOMY WITH RADIOACTIVE SEED LOCALIZATION Left 03/25/2022   Procedure: LEFT BREAST LUMPECTOMY WITH RADIOACTIVE SEED LOCALIZATION;  Surgeon: Abigail Miyamoto, MD;  Location: [facility];  Service: General;  Laterality: Left;   LAPAROSCOPY N/A 05/31/2013   Procedure: LAPAROSCOPY OPERATIVE, FULGURATION OF ENDOMETRIOSIS, REMOVAL OF BILATERAL FALLOPIAN TUBE CYST;  Surgeon: Zelphia Cairo, MD;  Location: [facility];  Service: Gynecology;  Laterality: N/A;   LEEP  2013    OPEN REDUCTION INTERNAL FIXATION (ORIF) TIBIA/FIBULA FRACTURE Left 04/22/2022   RHINOPLASTY     WISDOM TOOTH EXTRACTION      Family History  Problem Relation Age of Onset   Breast cancer Mother 61   Colon polyps Mother    Hyperlipidemia Mother    Colon polyps Father        >10   Hyperlipidemia Father    Hypertension Father    Diverticulosis Father    Kidney cancer Paternal Uncle        d.>50   Breast cancer Maternal Grandmother        dx 21s   Heart disease Maternal Grandfather    Stomach cancer Paternal Grandmother    Ovarian cancer Paternal Grandmother        d. 46s   Heart disease Paternal Grandfather    Breast cancer Other        dx 66s   Breast cancer Maternal Great-grandmother    Breast cancer Cousin        dx 30s   Colon cancer Neg Hx    Crohn's disease Neg Hx    Esophageal cancer Neg Hx    Rectal cancer Neg Hx     Social History:  reports that she has never smoked. She has been exposed to tobacco smoke. She has never used smokeless tobacco. She reports that she does not currently use alcohol after a past usage of about 7.0 - 14.0 standard drinks of alcohol per week. She reports that she does not use drugs.  Allergies:  Allergies  Allergen Reactions   Penicillins Rash  When she was 47years old, had a rash when took this for strep throat    Medications Prior to Admission  Medication Sig Dispense Refill Last Dose   B Complex-C (B-COMPLEX WITH VITAMIN C) tablet Take 1 tablet by mouth daily.   Past Week   Cholecalciferol (VITAMIN D3 PO) Take 1 tablet by mouth daily.   Past Week   fluticasone (FLONASE) 50 MCG/ACT nasal spray Place 1-2 sprays into both nostrils daily as needed for allergies.   Past Week   levothyroxine (SYNTHROID) 175 MCG tablet TAKE 1 TABLET(175 MCG) BY MOUTH EVERY DAY 90 tablet 3 03/01/2023 at 0630   loratadine (CLARITIN) 10 MG tablet Take 10 mg by mouth daily as needed for allergies.   02/28/2023   Multiple Vitamins-Minerals (MULTIVITAMIN ADULT  EXTRA C PO) Take 1 tablet by mouth daily.   Past Week   RETIN-A 0.025 % cream Apply 1 Application topically 3 (three) times a week.   Past Week   sertraline (ZOLOFT) 50 MG tablet Take 50 mg by mouth daily.   02/28/2023   traZODone (DESYREL) 50 MG tablet Take at bedtime as directed (Patient taking differently: Take 50 mg by mouth at bedtime. Take at bedtime as directed) 90 tablet 1 02/28/2023    Review of Systems  Constitutional: Negative.   Genitourinary:  Positive for pelvic pain.    Blood pressure 100/63, pulse 67, temperature 98.2 F (36.8 C), resp. rate 18, height 5\' 7"  (1.702 m), weight 64.9 kg, last menstrual period 01/28/2023, SpO2 100%. Physical Exam Constitutional:      Appearance: Normal appearance.  Cardiovascular:     Rate and Rhythm: Normal rate and regular rhythm.  Pulmonary:     Effort: Pulmonary effort is normal.     Breath sounds: Normal breath sounds.  Neurological:     General: No focal deficit present.     Mental Status: She is alert.  Psychiatric:        Mood and Affect: Mood normal.     Results for orders placed or performed during the hospital encounter of 03/01/23 (from the past 24 hour(s))  ABO/Rh     Status: None (Preliminary result)   Collection Time: 03/01/23  9:10 AM  Result Value Ref Range   ABO/RH(D) PENDING     No results found.  Assessment/Plan: 47 yo G3P2 MWF with chronic pelvic pain and hx of endometriosis here for laparoscopy with bilateral salpingectomy, possible treatment of endometriosis.  Questions answered.  Pt ready to proceed.  Jerene Bears 03/01/2023, 9:50 AM

## 2023-03-01 NOTE — Anesthesia Procedure Notes (Signed)
Procedure Name: Intubation Date/Time: 03/01/2023 10:35 AM  Performed by: Earlene Plater, CRNAPre-anesthesia Checklist: Patient identified, Emergency Drugs available, Suction available and Patient being monitored Patient Re-evaluated:Patient Re-evaluated prior to induction Oxygen Delivery Method: Circle system utilized Preoxygenation: Pre-oxygenation with 100% oxygen Induction Type: IV induction Ventilation: Mask ventilation without difficulty Laryngoscope Size: Mac and 3 Grade View: Grade I Tube type: Oral Number of attempts: 1 Airway Equipment and Method: Stylet and Oral airway Placement Confirmation: ETT inserted through vocal cords under direct vision, positive ETCO2 and breath sounds checked- equal and bilateral Secured at: 21 (at the lip) cm Tube secured with: Tape Dental Injury: Teeth and Oropharynx as per pre-operative assessment  Comments: ETT placed by Roe Coombs under supervision by MD Salvadore Farber

## 2023-03-01 NOTE — Op Note (Signed)
03/01/2023  12:26 PM  PATIENT:  Crystal Mcknight  47 y.o. female  PRE-OPERATIVE DIAGNOSIS:  Pelvic Pain  POST-OPERATIVE DIAGNOSIS:  Pelvic Pain  PROCEDURE:  Procedure(s): LAPAROSCOPIC EXCISION OF ENDOMETRIOSIS LAPAROSCOPIC BILATERAL SALPINGECTOMY  SURGEON:  Jerene Bears  ASSISTANTS: Milas Hock, MD.  An experienced assistant was required given the standard of surgical care given the complexity of the case.  This assistant was needed for exposure, dissection, suctioning, retraction, instrument exchange and for overall help during the procedure.  RNFA help was also unavailable.  ANESTHESIA:   general  ESTIMATED BLOOD LOSS: 10 mL  BLOOD ADMINISTERED:none   FLUIDS: 700cc lR  UOP: 250cc clear UOP  SPECIMEN:  peritoneal sampling of tissue c/w endometriosis  DISPOSITION OF SPECIMEN:  PATHOLOGY  FINDINGS: several areas of likely endometriosis including superficially on left fallopian tube, left ovary, anterior left abdominal wall, colon, just left of cervix, right uterosacral ligament, right posterior peritoneum  DESCRIPTION OF OPERATION: Patient is taken to the operating room. She is placed in the supine position. She is a running IV in place. Informed consent was present on the chart. SCDs on her lower extremities and functioning properly. Patient was positioned while she was awake.  Her legs were placed in the low lithotomy position in [place] stirrups. Her arms were tucked by the side.  General endotracheal anesthesia was administered by the anesthesia staff without difficulty. Dr. Salvadore Farber, anesthesia, oversaw case.  Time out performed.    Clora prep was then used to prep the abdomen and Hibiclens was used to prep the inner thighs, perineum and vagina. Once 3 minutes had past the patient was draped in a normal standard fashion. The legs were lifted to the high lithotomy position. The cervix was visualized by placing a heavy weighted speculum in the posterior aspect of the vagina and  using a curved Deaver retractor to the retract anteriorly. The anterior lip of the cervix was grasped with single-tooth tenaculum.  A Hulka clamp was passed through the cervix and attached to the anterior lip of the cervix.  The tenaculum and vaginal retractors were removed.  A Foley catheter was placed to straight drain.  Clear urine was noted. Legs were lowered to the low lithotomy position and attention was turned the abdomen.  The umbilicus was everted.  Marcaine 0.25% used to anesthetize the skin.  Using #11 blade, 5mm skin incision was made.  A Veress needle was obtained. Syringe of sterile saline was placed on a open Veress needle.  With the abdomen elevated, the Veress needle was passed into the umbilicus until the pop was heard and then fluid started to drip.  Then low flow CO2 gas was attached the needle and the pneumoperitoneum was achieved without difficulty. Once four liters of gas was in the abdomen the Veress needle was removed and a 5 millimeter non-bladed Optiview trocar and port were passed directly to the abdomen. The laparoscope was then used to confirm intraperitoneal placement.  Locations for RLQ and LLQ ports were noted by transillumination of the abdominal wall.  0.25% marcaine was used to anesthetize the skin. 5mm skin incisions were made and the 5mm non bladed trochar/ports were placed under direct visualization.  All trochars were removed.  Pt was placed in Trendelenburg positioning.    Attention was turned to the left side. With uterus on stretch the left tube was excised off the ovary and mesosalpinx was cauterized and incised flush with the uterus.  There was endometriosis on this tube.  It was removed  from the abdomen.  In a similar fashion, the right tube was elevated and excised off the ovary and mesosalpinx.  Then it was cauterized and incised flush with the uterus. This was also removed from the abdomen.    Then the endometriosis that appeared superficial and on two locations  of the ovary was cauterized.  Next an area of endometriosis on the anterior abdominal wall was cauterized.  Then lesions present on the right uterosacral ligament were cauterized.  Endometriosis on the right inferior peritoneum was identified.  Ureter was seen and was near this.  Peritoneum was elevated and the peritoneum and endometriosis excised, freeing this specimen which was sent to pathology.  Care was taken to ensure only peritoneum was elevated and excised.  There was an area of endometriosis on the left colon that was not removed and a second area that was just lateral to the cervix right where the uterine artery was going into the uterus.  Due to risks for injury to these structures, this excision was not performed.    At this point, there was no additional endometriosis that required treatment.  Pelvis was irrigated.  No bleeding was noted.  CO2 pressures were lowered and, again, no bleeding was noted.  Ureters were noted and were peristalsing. he remaining instruments were removed.  The ports (except the suprapubic port) were removed under direct visualization of the laparoscope except the umbilical port.  Pneumoperitoneum was relieved.  The patient was taken out of Trendelenburg positioning.  Several deep breaths were given to the patient's trying to any gas the abdomen and finally the final port was removed.  The skin was then closed with subcuticular stitches of 3-0 Vicryl. The skin was cleansed Dermabond was applied. Attention was then turned the vagina and the Hulka clamp removed as well as the foley catheter.  Minimal bleeding was noted.  Sponge, lap, needle, instrument counts were correct x2. Patient tolerated the procedure very well. She was awakened from anesthesia, extubated and taken to recovery in stable condition.    COUNTS:  YES  PLAN OF CARE: Transfer to PACU

## 2023-03-01 NOTE — Anesthesia Preprocedure Evaluation (Addendum)
Anesthesia Evaluation  Patient identified by MRN, date of birth, ID band Patient awake    Reviewed: Allergy & Precautions, H&P , NPO status , Patient's Chart, lab work & pertinent test results  History of Anesthesia Complications (+) PONV and history of anesthetic complications (remote hx ponv, did well w/ anesthetics in 2023 (inhalational))  Airway Mallampati: II  TM Distance: >3 FB Neck ROM: Full    Dental  (+) Teeth Intact, Dental Advisory Given   Pulmonary neg pulmonary ROS   Pulmonary exam normal breath sounds clear to auscultation       Cardiovascular negative cardio ROS Normal cardiovascular exam Rhythm:Regular Rate:Normal     Neuro/Psych  PSYCHIATRIC DISORDERS Anxiety Depression    negative neurological ROS     GI/Hepatic negative GI ROS,,,(+)     substance abuse (hx etoh abuse remote past)  alcohol use  Endo/Other  Hypothyroidism    Renal/GU   negative genitourinary   Musculoskeletal  (+) Arthritis , Osteoarthritis,    Abdominal   Peds negative pediatric ROS (+)  Hematology negative hematology ROS (+) Hb 13.9   Anesthesia Other Findings   Reproductive/Obstetrics Pelvic pain                             Anesthesia Physical Anesthesia Plan  ASA: 2  Anesthesia Plan: General   Post-op Pain Management: Tylenol PO (pre-op)*, Ketamine IV* and Precedex   Induction: Intravenous  PONV Risk Score and Plan: 4 or greater and Ondansetron, Dexamethasone, Propofol infusion, TIVA, Midazolam and Treatment may vary due to age or medical condition  Airway Management Planned: Oral ETT  Additional Equipment: None  Intra-op Plan:   Post-operative Plan: Extubation in OR  Informed Consent: I have reviewed the patients History and Physical, chart, labs and discussed the procedure including the risks, benefits and alternatives for the proposed anesthesia with the patient or authorized  representative who has indicated his/her understanding and acceptance.     Dental advisory given  Plan Discussed with: CRNA  Anesthesia Plan Comments:        Anesthesia Quick Evaluation

## 2023-03-01 NOTE — Anesthesia Postprocedure Evaluation (Signed)
Anesthesia Post Note  Patient: Crystal Mcknight  Procedure(s) Performed: LAPAROSCOPIC EXCISION OF ENDOMETRIOSIS LAPAROSCOPIC BILATERAL SALPINGECTOMY (Bilateral)     Patient location during evaluation: PACU Anesthesia Type: General Level of consciousness: awake and alert, oriented and patient cooperative Pain management: pain level controlled Vital Signs Assessment: post-procedure vital signs reviewed and stable Respiratory status: spontaneous breathing, nonlabored ventilation and respiratory function stable Cardiovascular status: blood pressure returned to baseline and stable Postop Assessment: no apparent nausea or vomiting Anesthetic complications: no   No notable events documented.  Last Vitals:  Vitals:   03/01/23 1230 03/01/23 1245  BP: 103/71 105/77  Pulse: 67 68  Resp: 11 12  Temp:    SpO2: 100% 99%                  Lannie Fields

## 2023-03-01 NOTE — Transfer of Care (Signed)
Immediate Anesthesia Transfer of Care Note  Patient: Crystal Mcknight  Procedure(s) Performed: LAPAROSCOPIC EXCISION OF ENDOMETRIOSIS LAPAROSCOPIC BILATERAL SALPINGECTOMY (Bilateral)  Patient Location: PACU  Anesthesia Type:General  Level of Consciousness: awake and patient cooperative  Airway & Oxygen Therapy: Patient Spontanous Breathing  Post-op Assessment: Report given to RN and Post -op Vital signs reviewed and stable  Post vital signs: Reviewed and stable  Last Vitals:  Vitals Value Taken Time  BP 107/69 03/01/23 1148  Temp    Pulse 52 03/01/23 1157  Resp 6 03/01/23 1157  SpO2 100 % 03/01/23 1157  Vitals shown include unfiled device data.     Complications: No notable events documented.

## 2023-03-02 ENCOUNTER — Encounter (HOSPITAL_COMMUNITY): Payer: Self-pay | Admitting: Obstetrics & Gynecology

## 2023-03-03 LAB — SURGICAL PATHOLOGY

## 2023-03-07 ENCOUNTER — Encounter (HOSPITAL_BASED_OUTPATIENT_CLINIC_OR_DEPARTMENT_OTHER): Payer: Self-pay | Admitting: Obstetrics & Gynecology

## 2023-03-09 ENCOUNTER — Other Ambulatory Visit: Payer: Self-pay | Admitting: Surgery

## 2023-03-09 DIAGNOSIS — Z803 Family history of malignant neoplasm of breast: Secondary | ICD-10-CM

## 2023-03-10 ENCOUNTER — Ambulatory Visit (HOSPITAL_BASED_OUTPATIENT_CLINIC_OR_DEPARTMENT_OTHER): Payer: BC Managed Care – PPO | Admitting: Certified Nurse Midwife

## 2023-03-10 ENCOUNTER — Encounter (HOSPITAL_BASED_OUTPATIENT_CLINIC_OR_DEPARTMENT_OTHER): Payer: Self-pay | Admitting: Certified Nurse Midwife

## 2023-03-10 VITALS — BP 103/87 | HR 81 | Ht 67.0 in | Wt 145.2 lb

## 2023-03-10 DIAGNOSIS — Z9889 Other specified postprocedural states: Secondary | ICD-10-CM | POA: Insufficient documentation

## 2023-03-10 DIAGNOSIS — L239 Allergic contact dermatitis, unspecified cause: Secondary | ICD-10-CM | POA: Insufficient documentation

## 2023-03-10 NOTE — Progress Notes (Signed)
GYNECOLOGY  VISIT  CC:   Pt is 9 days post-op laparoscopic removal of endometriosis here with post-op problem  HPI: 47 y.o. [id number] Married White or Caucasian female. Pt states she started to develop some itching around her surgical incisions a few days ago. Two of her three incisions now have some erythema (umbilical incision and right incision). The incision on her left abdomen appears normal. Pt also has a scar (~49 years old) present mid-line at her pubic hair line. She states this old scar also itches and has some redness around it. Pt states "I feel like the itching is coming from the inside out". She is taking Zyrtec daily. Keeping incisions clean and dry.    Past Medical History:  Diagnosis Date   Abnormal Pap smear of cervix    Arthritis    hands,pt denies 11/11/21   Depression    Hypothyroidism    Pelvic pain    PONV (postoperative nausea and vomiting)    Substance abuse (HCC)    ETOH,RECOVERY FOR 4 YEARS UPDATED 11/11/21   Thyroid disease     MEDS:   Current Outpatient Medications on File Prior to Visit  Medication Sig Dispense Refill   B Complex-C (B-COMPLEX WITH VITAMIN C) tablet Take 1 tablet by mouth daily.     Cholecalciferol (VITAMIN D3 PO) Take 1 tablet by mouth daily.     fluticasone (FLONASE) 50 MCG/ACT nasal spray Place 1-2 sprays into both nostrils daily as needed for allergies.     HYDROcodone-acetaminophen (NORCO/VICODIN) 5-325 MG tablet Take 1-2 tablets by mouth every 6 (six) hours as needed for moderate pain. 15 tablet 0   ibuprofen (ADVIL) 800 MG tablet Take 1 tablet (800 mg total) by mouth every 8 (eight) hours as needed. 30 tablet 0   levothyroxine (SYNTHROID) 175 MCG tablet TAKE 1 TABLET(175 MCG) BY MOUTH EVERY DAY 90 tablet 3   loratadine (CLARITIN) 10 MG tablet Take 10 mg by mouth daily as needed for allergies.     Multiple Vitamins-Minerals (MULTIVITAMIN ADULT EXTRA C PO) Take 1 tablet by mouth daily.     RETIN-A 0.025 % cream Apply 1 Application  topically 3 (three) times a week.     sertraline (ZOLOFT) 50 MG tablet Take 50 mg by mouth daily.     traZODone (DESYREL) 50 MG tablet Take at bedtime as directed (Patient taking differently: Take 50 mg by mouth at bedtime. Take at bedtime as directed) 90 tablet 1   No current facility-administered medications on file prior to visit.    ALLERGIES: Penicillins  ROS No fever, no chills. Pt states bowel movements normal. No urinary complaints.   PHYSICAL EXAMINATION:    BP 103/87   Pulse 81   Ht 5\' 7"  (1.702 m)   Wt 145 lb 3.2 oz (65.9 kg)   LMP 03/03/2023 (Approximate)   BMI 22.74 kg/m     General appearance: alert, cooperative and appears stated age Abdomen: soft, non-tender. Some mild erythema noted around umbilicus as well as right abdominal incision. There is a small round 1cm around of redness around an older scar in her pubic hair line. None of her incisions are draining.    Assessment/Plan: 1. Allergic dermatitis -Continue Zyrtec or Claritin daily -Pt will apply Triamcinolone ointment to erythematous areas TID. (States she has ointment at home). She will use a pen to draw a circle around the redness and evaluate if redness is increasing. If she isn't feeling better tomorrow, pt will call office for follow-up. Toma Aran  Sherrye Puga

## 2023-03-14 ENCOUNTER — Encounter (HOSPITAL_BASED_OUTPATIENT_CLINIC_OR_DEPARTMENT_OTHER): Payer: Self-pay | Admitting: Certified Nurse Midwife

## 2023-03-14 ENCOUNTER — Telehealth (HOSPITAL_BASED_OUTPATIENT_CLINIC_OR_DEPARTMENT_OTHER): Payer: BC Managed Care – PPO | Admitting: Obstetrics & Gynecology

## 2023-03-15 ENCOUNTER — Telehealth (HOSPITAL_BASED_OUTPATIENT_CLINIC_OR_DEPARTMENT_OTHER): Payer: Self-pay | Admitting: Obstetrics & Gynecology

## 2023-03-29 ENCOUNTER — Encounter (HOSPITAL_BASED_OUTPATIENT_CLINIC_OR_DEPARTMENT_OTHER): Payer: BC Managed Care – PPO | Admitting: Obstetrics & Gynecology

## 2023-03-30 ENCOUNTER — Ambulatory Visit (HOSPITAL_BASED_OUTPATIENT_CLINIC_OR_DEPARTMENT_OTHER): Payer: BC Managed Care – PPO | Admitting: Obstetrics & Gynecology

## 2023-03-30 ENCOUNTER — Encounter (HOSPITAL_BASED_OUTPATIENT_CLINIC_OR_DEPARTMENT_OTHER): Payer: Self-pay | Admitting: Obstetrics & Gynecology

## 2023-03-30 VITALS — BP 124/82 | HR 88 | Wt 122.8 lb

## 2023-03-30 DIAGNOSIS — N946 Dysmenorrhea, unspecified: Secondary | ICD-10-CM

## 2023-03-30 DIAGNOSIS — Z9079 Acquired absence of other genital organ(s): Secondary | ICD-10-CM

## 2023-03-30 DIAGNOSIS — K7460 Unspecified cirrhosis of liver: Secondary | ICD-10-CM

## 2023-03-30 DIAGNOSIS — N809 Endometriosis, unspecified: Secondary | ICD-10-CM

## 2023-03-30 DIAGNOSIS — R102 Pelvic and perineal pain unspecified side: Secondary | ICD-10-CM

## 2023-03-30 DIAGNOSIS — D1803 Hemangioma of intra-abdominal structures: Secondary | ICD-10-CM

## 2023-03-30 DIAGNOSIS — K828 Other specified diseases of gallbladder: Secondary | ICD-10-CM

## 2023-04-02 NOTE — Progress Notes (Incomplete)
GYNECOLOGY  VISIT  CC:   post op recheck  HPI: 47 y.o. G3P0012 Married White or Caucasian female here for recheck after undergoing *** on ***.  She reports bleeding is {None/Minimal:21241}.  She has {No/  **:31982} pain.  Bowel function is {Normal/Abnormal:304960160}.  Bladder function is normal.    Pathology reviewed:  {YES/NO:21197}.  Questions answered.    MEDS:   Current Outpatient Medications on File Prior to Visit  Medication Sig Dispense Refill  . B Complex-C (B-COMPLEX WITH VITAMIN C) tablet Take 1 tablet by mouth daily.    . Cholecalciferol (VITAMIN D3 PO) Take 1 tablet by mouth daily.    . fluticasone (FLONASE) 50 MCG/ACT nasal spray Place 1-2 sprays into both nostrils daily as needed for allergies.    Marland Kitchen HYDROcodone-acetaminophen (NORCO/VICODIN) 5-325 MG tablet Take 1-2 tablets by mouth every 6 (six) hours as needed for moderate pain. 15 tablet 0  . ibuprofen (ADVIL) 800 MG tablet Take 1 tablet (800 mg total) by mouth every 8 (eight) hours as needed. 30 tablet 0  . levothyroxine (SYNTHROID) 175 MCG tablet TAKE 1 TABLET(175 MCG) BY MOUTH EVERY DAY 90 tablet 3  . loratadine (CLARITIN) 10 MG tablet Take 10 mg by mouth daily as needed for allergies.    . Multiple Vitamins-Minerals (MULTIVITAMIN ADULT EXTRA C PO) Take 1 tablet by mouth daily.    Marland Kitchen RETIN-A 0.025 % cream Apply 1 Application topically 3 (three) times a week.    . sertraline (ZOLOFT) 50 MG tablet Take 50 mg by mouth daily.    . traZODone (DESYREL) 50 MG tablet Take at bedtime as directed (Patient taking differently: Take 50 mg by mouth at bedtime. Take at bedtime as directed) 90 tablet 1   No current facility-administered medications on file prior to visit.    SH:  Smoking {YES/NO:21197}   PHYSICAL EXAMINATION:    BP 124/82 (BP Location: Right Arm, Patient Position: Sitting, Cuff Size: Normal)   Pulse 88   Wt 122 lb 12.8 oz (55.7 kg)   LMP 03/03/2023 (Approximate)   BMI 19.23 kg/m     General appearance: alert,  cooperative and appears stated age CV:  {Exam; heart brief:31539} Lungs:  {pe lungs ob:314451} Abdomen: soft, non-tender; bowel sounds normal; no masses,  no organomegaly Incisions:  C/D/I  Pelvic: External genitalia:  no lesions              Urethra:  normal appearing urethra with no masses, tenderness or lesions              Bartholins and Skenes: normal                 Vagina: normal appearing vagina with normal color and discharge, no lesions              Cervix: {CHL AMB PHY EX CERVIX NORM DEFAULT:[phone]::"no lesions"}              Bimanual Exam:  Uterus:  {CHL AMB PHY EX UTERUS NORM DEFAULT:[phone]::"normal size, contour, position, consistency, mobility, non-tender"}              Adnexa: {CHL AMB PHY EX ADNEXA NO MASS DEFAULT:[phone]::"no mass, fullness, tenderness"}  Chaperone, ***, was present for exam.  Assessment/Plan:

## 2023-04-02 NOTE — Progress Notes (Unsigned)
GYNECOLOGY  VISIT  CC:   post op recheck  HPI: 47 y.o. G3P0012 Married White or Caucasian female here for recheck after undergoing bilateral salpingectomy, biopsies of endometriosis, treatment of endometriosis on 03/01/2023.   She is doing well.  Cycle late this month.  Discussed with pt this can happen with stress of surgery.  Currently has minimal pain.  Images from surgery reviewed.  She understands not all endometriosis could be treated due to location of it.  As well, she did have some significant stool burden in colon all the was to the proximal colon.  Images of this shown as well.  Treatment with stool softener discussed.  Also, endometriosis treatment discussed including progesterones, IUD, Or[place]issa or similar type medication.  If desired hysterectomy, would want MIGS involved.    She doesn't want to start anything right now and wants to wait and see how her first cycle after the procedure is from a pain standpoint.  She is hope that even with incomplete treatment, there will be improvement as most of the endometriosis was treated.    Pathology reviewed:  Yes .  Questions answered.    Incidental surgical findings of probable scarring of liver noted.  She does have alcohol hx but no recent imaging.  H/o normal liver enzymes per her hx.  .  This is possible cirrhosis.  Imaging ordered.  May need referral or additional blood work pending these results.    MEDS:   Current Outpatient Medications on File Prior to Visit  Medication Sig Dispense Refill  . B Complex-C (B-COMPLEX WITH VITAMIN C) tablet Take 1 tablet by mouth daily.    . Cholecalciferol (VITAMIN D3 PO) Take 1 tablet by mouth daily.    . fluticasone (FLONASE) 50 MCG/ACT nasal spray Place 1-2 sprays into both nostrils daily as needed for allergies.    Marland Kitchen HYDROcodone-acetaminophen (NORCO/VICODIN) 5-325 MG tablet Take 1-2 tablets by mouth every 6 (six) hours as needed for moderate pain. 15 tablet 0  . ibuprofen (ADVIL) 800 MG tablet Take  1 tablet (800 mg total) by mouth every 8 (eight) hours as needed. 30 tablet 0  . levothyroxine (SYNTHROID) 175 MCG tablet TAKE 1 TABLET(175 MCG) BY MOUTH EVERY DAY 90 tablet 3  . loratadine (CLARITIN) 10 MG tablet Take 10 mg by mouth daily as needed for allergies.    . Multiple Vitamins-Minerals (MULTIVITAMIN ADULT EXTRA C PO) Take 1 tablet by mouth daily.    Marland Kitchen RETIN-A 0.025 % cream Apply 1 Application topically 3 (three) times a week.    . sertraline (ZOLOFT) 50 MG tablet Take 50 mg by mouth daily.    . traZODone (DESYREL) 50 MG tablet Take at bedtime as directed (Patient taking differently: Take 50 mg by mouth at bedtime. Take at bedtime as directed) 90 tablet 1   No current facility-administered medications on file prior to visit.    SH:  Smoking No    PHYSICAL EXAMINATION:    BP 124/82 (BP Location: Right Arm, Patient Position: Sitting, Cuff Size: Normal)   Pulse 88   Wt 122 lb 12.8 oz (55.7 kg)   LMP 03/03/2023 (Approximate)   BMI 19.23 kg/m     Physical Exam Constitutional:      Appearance: Normal appearance.  Abdominal:     Comments: Inc: C/D/I  Neurological:     Mental Status: She is alert.  Psychiatric:        Mood and Affect: Mood normal.    Assessment/Plan: 1. Endometriosis - treatment options  discussed.  She wants to see what happens after next cycle and will call to give update.  Currently not interested in additional surgery.  2. H/O bilateral salpingectomy  3. Pelvic pain  4. Dysmenorrhea  5. Cirrhosis of liver without ascites, unspecified hepatic cirrhosis type (HCC) - US Abdomen Limited RUQ (LIVER/GB); Future - will await results and see what else may be needed

## 2023-04-07 ENCOUNTER — Ambulatory Visit (HOSPITAL_BASED_OUTPATIENT_CLINIC_OR_DEPARTMENT_OTHER)
Admission: RE | Admit: 2023-04-07 | Discharge: 2023-04-07 | Disposition: A | Discharge status: Home / Self Care | Payer: BC Managed Care – PPO | Source: Ambulatory Visit | Attending: Obstetrics & Gynecology | Admitting: Obstetrics & Gynecology

## 2023-04-07 DIAGNOSIS — K7460 Unspecified cirrhosis of liver: Secondary | ICD-10-CM | POA: Insufficient documentation

## 2023-04-07 DIAGNOSIS — K7689 Other specified diseases of liver: Secondary | ICD-10-CM | POA: Diagnosis not present

## 2023-04-15 NOTE — Addendum Note (Signed)
Addended by: Jerene Bears on: 04/15/2023 12:34 PM   Modules accepted: Orders

## 2023-05-02 ENCOUNTER — Encounter: Payer: Self-pay | Admitting: Surgery

## 2023-05-07 ENCOUNTER — Ambulatory Visit
Admission: RE | Admit: 2023-05-07 | Discharge: 2023-05-07 | Disposition: A | Discharge status: Home / Self Care | Payer: BC Managed Care – PPO | Source: Ambulatory Visit | Attending: Surgery | Admitting: Surgery

## 2023-05-07 DIAGNOSIS — R928 Other abnormal and inconclusive findings on diagnostic imaging of breast: Secondary | ICD-10-CM | POA: Diagnosis not present

## 2023-05-07 DIAGNOSIS — Z803 Family history of malignant neoplasm of breast: Secondary | ICD-10-CM

## 2023-05-07 MED ORDER — GADOPICLENOL 0.5 MMOL/ML IV SOLN
6.0000 mL | Freq: Once | INTRAVENOUS | Status: AC | PRN
  Administered 2023-05-07: 6 mL via INTRAVENOUS

## 2023-05-10 ENCOUNTER — Encounter (HOSPITAL_BASED_OUTPATIENT_CLINIC_OR_DEPARTMENT_OTHER): Payer: Self-pay | Admitting: *Deleted

## 2023-06-08 ENCOUNTER — Ambulatory Visit: Payer: BC Managed Care – PPO | Admitting: Internal Medicine

## 2023-06-16 ENCOUNTER — Encounter: Payer: Self-pay | Admitting: Internal Medicine

## 2023-06-16 ENCOUNTER — Ambulatory Visit: Payer: BC Managed Care – PPO | Admitting: Internal Medicine

## 2023-06-16 VITALS — BP 112/66 | HR 73 | Resp 18 | Wt 144.6 lb

## 2023-06-16 DIAGNOSIS — E039 Hypothyroidism, unspecified: Secondary | ICD-10-CM | POA: Diagnosis not present

## 2023-06-16 NOTE — Progress Notes (Addendum)
Patient ID: Crystal Mcknight, female   DOB: 1976-01-04, 48 y.o.   MRN: [id number]   HPI  Crystal Mcknight is a 48 y.o.-year-old female, referred by her PCP, Alysia Penna, MD returning for follow-up for of hypothyroidism. She moved to [place] from [place] in 2009.  She was followed by endocrinology there.  Last visit with me 1 year ago.  Interim history: At today's visit, she has no complaints: No fatigue, significant weight changes, constipation, cold intolerance. She fell and fractured her L tibia when in [place] 04/15/2022 >> she was in a boot at last visit.  This healed.  She was able to ski this year. Since last OV, she had lap sx for endometriosis - 02/2023.    Reviewed and addended history: Pt. has been dx with hypothyroidism in 1990s (at 50-19 y/o). She also has a h/o postpartum thyroiditis after both pregnancies - last after the birth of her son.  She had a thyroid ultrasound before moving from [place] and it was mentioned that her thyroid was not visible anymore.  She was previously on Synthroid DAW 175 + 25 mcg, dose increased 06/21/2019.  At that time, she saw her OB/GYN Dr. and she had several symptoms including fatigue, constipation, hair loss. A TSH was checked and it was high.  2 days prior to our appointment in 06/2019 she woke up with vertigo >> saw PCP >> TFTs repeated: normal. She had vertigo 12 years ago >> investigated by MRI >> no pathology but she had appendicitis few days later so the vertigo was attributed to the acute episode.  We previously decreased the dose of her levothyroxine to 150 mcg daily as her TSH was suppressed. She felt terrible on this dose (VERY tired) >> she increased to 175 mcg daily and her fatigue improved.  Since TFTs returns normal, we continued the same dose.  However, afterwards, her TSH returned suppressed and we decrease dthe dose to 150 mcg daily.  Due to previous fatigue on this dose, she increased it back to 175 mcg daily.  Since TSH remained  suppressed, again advised her to switch 150 mcg daily.  In 07/2021, we switched to 175 alternating with 150 mcg every other day.  In 08/2022, she had a TSH close to the upper limit of the target range in PCPs office so we increased the dose to 175 daily.  She takes now takes generic levothyroxine: - in am (around 5-6 AM) - fasting - at least 30 min from b'fast - no calcium - no iron - + multivitamins, Mg, vitamin D - at night - + B complex  - 2 hours after LT4-last dose today (30 mcg of biotin) - no PPIs  Reviewed her TFTs: Lab Results  Component Value Date   TSH 2.54 01/14/2023   TSH 3.20 06/07/2022   TSH 2.01 11/18/2021   TSH 4.79 08/05/2021   TSH 1.06 02/23/2021   TSH 0.91 12/04/2020   TSH 0.22 (L) 10/15/2020   TSH 1.35 06/04/2020   TSH 0.59 01/08/2020   TSH 3.93 11/29/2019   FREET4 1.12 01/14/2023   FREET4 0.96 06/07/2022   FREET4 1.14 11/18/2021   FREET4 0.93 08/05/2021   FREET4 0.94 02/23/2021   FREET4 1.00 12/04/2020   FREET4 1.13 10/15/2020   FREET4 0.98 06/04/2020   FREET4 0.90 01/08/2020   FREET4 0.87 11/29/2019  08/2022: TSH 4.1 in PCPs office  Prev: Labs from [facility] -08/12/2020: TSH 0.349 (0.45-4.5) Vitamin D 69.4 HbA1c 5.5% Lipids: 16/92/51/88-fasting Glucose 89, BUN/creatinine 12/0.81,  GFR 91, with the rest of the CMP normal We decreased  her levothyroxine dose to 150 mcg daily. 06/21/2019: TSH 7.120 (0.45-4.5), total T4 7.2 (4.5-12) - dose increased from 175 to 200 mcg daily 07/29/2018: TSH 3.29  Her antithyroid antibodies were not elevated: Component     Latest Ref Rng & Units 07/11/2019  Thyroperoxidase Ab SerPl-aCnc     <9 IU/mL 1  Thyroglobulin Ab     < or = 1 IU/mL <1   In 2021, due to the increased levothyroxine requirements and also due to the fact that she was complaining of bloating and she had family history of autoimmune diseases, we tested her for celiac disease.  The investigation was negative: Component     Latest Ref  Rng & Units 07/11/2019  Immunoglobulin A     47 - 310 mg/dL 811  (tTG) Ab, IgG     U/mL 3  (tTG) Ab, IgA     U/mL 1  Gliadin IgA     Units 9  Deamidated Gliadin Abs, IgG     Units 1   Vitamin D level was normal: 08/12/2020: Vitamin D 69.4. Lab Results  Component Value Date   VD25OH 48.03 07/11/2019  She takes vitamin D.  In the past, she described fatigue, cold intolerance, constipation, hair loss on forearms, anxiety/depression.  She had normal menses but with increased flow and also history of endometriosis with increased cramping.  She continues to see OB/GYN.  Pt denies: - feeling nodules in neck - hoarseness - dysphagia - choking  She has + FH of thyroid disorders in: father and P aunts. Father had Myasthenia Gravis and aunts with SLE. No FH of thyroid cancer. No h/o radiation tx to head or neck. No recent steroids use.   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  ROS: + see HPI  I reviewed pt's medications, allergies, PMH, social hx, family hx, and changes were documented in the history of present illness. Otherwise, unchanged from my initial visit note.  Past Medical History:  Diagnosis Date   Abnormal Pap smear of cervix    Arthritis    hands,pt denies 11/11/21   Depression    Hypothyroidism    Pelvic pain    PONV (postoperative nausea and vomiting)    Substance abuse (HCC)    ETOH,RECOVERY FOR 4 YEARS UPDATED 11/11/21   Thyroid disease    Past Surgical History:  Procedure Laterality Date   APPENDECTOMY     BREAST LUMPECTOMY WITH RADIOACTIVE SEED LOCALIZATION Left 03/25/2022   Procedure: LEFT BREAST LUMPECTOMY WITH RADIOACTIVE SEED LOCALIZATION;  Surgeon: Abigail Miyamoto, MD;  Location: [facility];  Service: General;  Laterality: Left;   LAPAROSCOPIC BILATERAL SALPINGECTOMY Bilateral 03/01/2023   Procedure: LAPAROSCOPIC BILATERAL SALPINGECTOMY;  Surgeon: Jerene Bears, MD;  Location: [facility];   Service: Gynecology;  Laterality: Bilateral;   LAPAROSCOPY N/A 05/31/2013   Procedure: LAPAROSCOPY OPERATIVE, FULGURATION OF ENDOMETRIOSIS, REMOVAL OF BILATERAL FALLOPIAN TUBE CYST;  Surgeon: Zelphia Cairo, MD;  Location: [facility];  Service: Gynecology;  Laterality: N/A;   LEEP  2013   OPEN REDUCTION INTERNAL FIXATION (ORIF) TIBIA/FIBULA FRACTURE Left 04/22/2022   RHINOPLASTY     WISDOM TOOTH EXTRACTION     Social History   Socioeconomic History   Marital status: Married    Spouse name: Not on file   Number of children: 2   Years of education: Not on file   Highest education level:  Not on file  Occupational History   Not on file  Tobacco Use   Smoking status: Never Smoker   Smokeless tobacco: Never Used  Substance and Sexual Activity   Alcohol use: No, stopped   Drug use: No   Sexual activity: Yes    Birth control/protection: None    Comment: husband vasectomy  Other Topics Concern   Not on file  Social History Narrative   Not on file   Social Determinants of Health   Financial Resource Strain:    Difficulty of Paying Living Expenses: Not on file  Food Insecurity:    Worried About Programme researcher, broadcasting/film/video in the Last Year: Not on file   [employer] of Food in the Last Year: Not on file  Transportation Needs:    Lack of Transportation (Medical): Not on file   Lack of Transportation (Non-Medical): Not on file  Physical Activity:    Days of Exercise per Week: Not on file   Minutes of Exercise per Session: Not on file  Stress:    Feeling of Stress : Not on file  Social Connections:    Frequency of Communication with Friends and Family: Not on file   Frequency of Social Gatherings with Friends and Family: Not on file   Attends Religious Services: Not on file   Active Member of Clubs or Organizations: Not on file   Attends Banker Meetings: Not on file   Marital Status: Not on file  Intimate Partner Violence:    Fear of Current or Ex-Partner: Not on file    Emotionally Abused: Not on file   Physically Abused: Not on file   Sexually Abused: Not on file   Current Outpatient Medications  Medication Sig Dispense Refill   B Complex-C (B-COMPLEX WITH VITAMIN C) tablet Take 1 tablet by mouth daily.     Cholecalciferol (VITAMIN D3 PO) Take 1 tablet by mouth daily.     fluticasone (FLONASE) 50 MCG/ACT nasal spray Place 1-2 sprays into both nostrils daily as needed for allergies.     HYDROcodone-acetaminophen (NORCO/VICODIN) 5-325 MG tablet Take 1-2 tablets by mouth every 6 (six) hours as needed for moderate pain. 15 tablet 0   ibuprofen (ADVIL) 800 MG tablet Take 1 tablet (800 mg total) by mouth every 8 (eight) hours as needed. 30 tablet 0   levothyroxine (SYNTHROID) 175 MCG tablet TAKE 1 TABLET(175 MCG) BY MOUTH EVERY DAY 90 tablet 3   loratadine (CLARITIN) 10 MG tablet Take 10 mg by mouth daily as needed for allergies.     Multiple Vitamins-Minerals (MULTIVITAMIN ADULT EXTRA C PO) Take 1 tablet by mouth daily.     RETIN-A 0.025 % cream Apply 1 Application topically 3 (three) times a week.     sertraline (ZOLOFT) 50 MG tablet Take 50 mg by mouth daily.     traZODone (DESYREL) 50 MG tablet Take at bedtime as directed (Patient taking differently: Take 50 mg by mouth at bedtime. Take at bedtime as directed) 90 tablet 1   No current facility-administered medications for this visit.    Allergies  Allergen Reactions   Other Rash    Reaction to skin glue with laparoscopy 02/2023.    Penicillins Rash    When she was 48years old, had a rash when took this for strep throat   Family History  Problem Relation Age of Onset   Breast cancer Mother 67   Colon polyps Mother    Hyperlipidemia Mother    Colon polyps  Father        >10   Hyperlipidemia Father    Hypertension Father    Diverticulosis Father    Kidney cancer Paternal Uncle        d.>50   Breast cancer Maternal Grandmother        dx 32s   Heart disease Maternal Grandfather    Stomach cancer  Paternal Grandmother    Ovarian cancer Paternal Grandmother        d. 36s   Heart disease Paternal Grandfather    Breast cancer Other        dx 39s   Breast cancer Maternal Great-grandmother    Breast cancer Cousin        dx 30s   Colon cancer Neg Hx    Crohn's disease Neg Hx    Esophageal cancer Neg Hx    Rectal cancer Neg Hx   Also, breast cancer in mother. + see HPI  PE: BP 112/66   Pulse 73   Resp 18   Wt 144 lb 9.6 oz (65.6 kg)   SpO2 97%   BMI 22.65 kg/m  Wt Readings from Last 10 Encounters:  06/16/23 144 lb 9.6 oz (65.6 kg)  03/30/23 122 lb 12.8 oz (55.7 kg)  03/10/23 145 lb 3.2 oz (65.9 kg)  03/01/23 143 lb (64.9 kg)  02/23/23 143 lb 11.2 oz (65.2 kg)  12/07/22 142 lb (64.4 kg)  06/24/22 140 lb (63.5 kg)  06/07/22 141 lb (64 kg)  03/25/22 141 lb 12.1 oz (64.3 kg)  12/09/21 142 lb 6.4 oz (64.6 kg)   Constitutional: Normal weight, in NAD Eyes:  EOMI, no exophthalmos ENT: no neck masses, no cervical lymphadenopathy Cardiovascular: RRR, No MRG Respiratory: CTA B Musculoskeletal: no deformities Skin:no rashes Neurological: no tremor with outstretched hands  ASSESSMENT: 1. Hypothyroidism  PLAN:  1. Patient with longstanding hypothyroidism, on LT4 therapy.  She was previously on Synthroid d.a.w. but this was not covered by her insurance anymore.  She is currently on generic levothyroxine. -Her TFTs fluctuated in the past.  We adjusted her doses so she is now alternating 150 mcg with 175 mcg every other day.  However, since last visit, she had a TSH higher in the normal range in PCPs office in 08/2022, so we increased the dose to 175 mcg daily. -Her TPO and ATA antibodies were not elevated to suggest Hashimoto's hypothyroidism - latest thyroid labs reviewed with pt. >> normal: Lab Results  Component Value Date   TSH 2.54 01/14/2023  - she continues on the above dose-feeling better than on the lower dose - she is on a B complex vitamin, biotin, 30 mcg daily.   She did take this today as she forgot.  We discussed that this is quite a low dose which will probably not affect the results. - we discussed about taking the thyroid hormone every day, with water, >30 minutes before breakfast, separated by >4 hours from acid reflux medications, calcium, iron, multivitamins. Pt. is taking it correctly. - will check thyroid tests today: TSH and fT4 - If labs are abnormal, she will need to return for repeat TFTs in 1.5 months - OTW, I will see her back in a year  Orders Placed This Encounter  Procedures   TSH   T4, free   Component     Latest Ref Rng 06/16/2023  TSH     mIU/L 3.38   T4,Free(Direct)     0.8 - 1.8 ng/dL 1.3   TFTs are normal.  I will refill her levothyroxine now to prevent offset between prescription refills and visits.Carlus Pavlov, MD PhD [facility]

## 2023-06-16 NOTE — Patient Instructions (Addendum)
Please continue Levothyroxine 175 mcg every day.  Please stop at the lab.  You should have an endocrinology follow-up appointment in 1 year.

## 2023-06-17 ENCOUNTER — Encounter: Payer: Self-pay | Admitting: Internal Medicine

## 2023-06-17 LAB — T4, FREE: Free T4: 1.3 ng/dL (ref 0.8–1.8)

## 2023-06-17 LAB — TSH: TSH: 3.38 m[IU]/L

## 2023-06-17 MED ORDER — LEVOTHYROXINE SODIUM 175 MCG PO TABS: ORAL_TABLET | ORAL | 3 refills | Status: DC

## 2023-06-17 NOTE — Addendum Note (Signed)
Addended by: Carlus Pavlov on: 06/17/2023 12:06 PM   Modules accepted: Orders

## 2023-07-20 DIAGNOSIS — R051 Acute cough: Secondary | ICD-10-CM | POA: Diagnosis not present

## 2023-07-20 DIAGNOSIS — J069 Acute upper respiratory infection, unspecified: Secondary | ICD-10-CM | POA: Diagnosis not present

## 2023-08-09 ENCOUNTER — Ambulatory Visit (HOSPITAL_BASED_OUTPATIENT_CLINIC_OR_DEPARTMENT_OTHER)
Admission: RE | Admit: 2023-08-09 | Discharge: 2023-08-09 | Disposition: A | Discharge status: Home / Self Care | Payer: BC Managed Care – PPO | Source: Ambulatory Visit | Attending: Obstetrics & Gynecology | Admitting: Obstetrics & Gynecology

## 2023-08-09 DIAGNOSIS — D1803 Hemangioma of intra-abdominal structures: Secondary | ICD-10-CM | POA: Insufficient documentation

## 2023-08-09 DIAGNOSIS — R932 Abnormal findings on diagnostic imaging of liver and biliary tract: Secondary | ICD-10-CM | POA: Diagnosis not present

## 2023-08-09 DIAGNOSIS — K828 Other specified diseases of gallbladder: Secondary | ICD-10-CM | POA: Diagnosis not present

## 2023-08-10 ENCOUNTER — Other Ambulatory Visit: Payer: Self-pay | Admitting: Internal Medicine

## 2023-08-10 DIAGNOSIS — Z1231 Encounter for screening mammogram for malignant neoplasm of breast: Secondary | ICD-10-CM

## 2023-08-13 ENCOUNTER — Other Ambulatory Visit (HOSPITAL_BASED_OUTPATIENT_CLINIC_OR_DEPARTMENT_OTHER): Payer: Self-pay | Admitting: Obstetrics & Gynecology

## 2023-08-13 DIAGNOSIS — R932 Abnormal findings on diagnostic imaging of liver and biliary tract: Secondary | ICD-10-CM

## 2023-08-22 ENCOUNTER — Other Ambulatory Visit (HOSPITAL_BASED_OUTPATIENT_CLINIC_OR_DEPARTMENT_OTHER): Payer: Self-pay | Admitting: Obstetrics & Gynecology

## 2023-08-22 DIAGNOSIS — K828 Other specified diseases of gallbladder: Secondary | ICD-10-CM

## 2023-08-23 ENCOUNTER — Encounter (HOSPITAL_BASED_OUTPATIENT_CLINIC_OR_DEPARTMENT_OTHER): Payer: Self-pay | Admitting: Obstetrics & Gynecology

## 2023-08-23 DIAGNOSIS — E039 Hypothyroidism, unspecified: Secondary | ICD-10-CM | POA: Diagnosis not present

## 2023-08-24 ENCOUNTER — Encounter: Payer: Self-pay | Admitting: Internal Medicine

## 2023-08-24 ENCOUNTER — Other Ambulatory Visit: Payer: Self-pay | Admitting: Internal Medicine

## 2023-08-24 DIAGNOSIS — E039 Hypothyroidism, unspecified: Secondary | ICD-10-CM

## 2023-08-24 MED ORDER — LEVOTHYROXINE SODIUM 200 MCG PO TABS: ORAL_TABLET | ORAL | 3 refills | Status: DC

## 2023-08-25 ENCOUNTER — Other Ambulatory Visit: Payer: Self-pay

## 2023-08-31 ENCOUNTER — Encounter (HOSPITAL_BASED_OUTPATIENT_CLINIC_OR_DEPARTMENT_OTHER): Payer: Self-pay | Admitting: Obstetrics & Gynecology

## 2023-08-31 DIAGNOSIS — Z1339 Encounter for screening examination for other mental health and behavioral disorders: Secondary | ICD-10-CM | POA: Diagnosis not present

## 2023-08-31 DIAGNOSIS — E039 Hypothyroidism, unspecified: Secondary | ICD-10-CM | POA: Diagnosis not present

## 2023-08-31 DIAGNOSIS — Z1331 Encounter for screening for depression: Secondary | ICD-10-CM | POA: Diagnosis not present

## 2023-08-31 DIAGNOSIS — Z0000 Encounter for general adult medical examination without abnormal findings: Secondary | ICD-10-CM | POA: Diagnosis not present

## 2023-09-13 ENCOUNTER — Other Ambulatory Visit: Payer: Self-pay

## 2023-09-19 ENCOUNTER — Ambulatory Visit
Admission: RE | Admit: 2023-09-19 | Discharge: 2023-09-19 | Disposition: A | Discharge status: Home / Self Care | Source: Ambulatory Visit | Attending: Internal Medicine | Admitting: Internal Medicine

## 2023-09-19 DIAGNOSIS — Z1231 Encounter for screening mammogram for malignant neoplasm of breast: Secondary | ICD-10-CM | POA: Diagnosis not present

## 2023-09-20 DIAGNOSIS — D135 Benign neoplasm of extrahepatic bile ducts: Secondary | ICD-10-CM | POA: Diagnosis not present

## 2023-09-21 ENCOUNTER — Other Ambulatory Visit: Payer: Self-pay | Admitting: General Surgery

## 2023-09-21 DIAGNOSIS — D135 Benign neoplasm of extrahepatic bile ducts: Secondary | ICD-10-CM

## 2023-09-26 ENCOUNTER — Ambulatory Visit: Admitting: Physician Assistant

## 2023-10-18 ENCOUNTER — Ambulatory Visit
Admission: RE | Admit: 2023-10-18 | Discharge: 2023-10-18 | Disposition: A | Discharge status: Home / Self Care | Source: Ambulatory Visit | Attending: General Surgery | Admitting: General Surgery

## 2023-10-18 DIAGNOSIS — D135 Benign neoplasm of extrahepatic bile ducts: Secondary | ICD-10-CM

## 2023-10-18 DIAGNOSIS — K828 Other specified diseases of gallbladder: Secondary | ICD-10-CM | POA: Diagnosis not present

## 2023-10-18 MED ORDER — GADOPICLENOL 0.5 MMOL/ML IV SOLN
6.0000 mL | Freq: Once | INTRAVENOUS | Status: AC | PRN
  Administered 2023-10-18: 6 mL via INTRAVENOUS

## 2023-12-01 ENCOUNTER — Other Ambulatory Visit (HOSPITAL_COMMUNITY)
Admission: RE | Admit: 2023-12-01 | Discharge: 2023-12-01 | Disposition: A | Discharge status: Home / Self Care | Source: Ambulatory Visit | Attending: Obstetrics & Gynecology | Admitting: Obstetrics & Gynecology

## 2023-12-01 ENCOUNTER — Ambulatory Visit (HOSPITAL_BASED_OUTPATIENT_CLINIC_OR_DEPARTMENT_OTHER): Admitting: Obstetrics & Gynecology

## 2023-12-01 VITALS — BP 94/69 | HR 93

## 2023-12-01 DIAGNOSIS — T8149XA Infection following a procedure, other surgical site, initial encounter: Secondary | ICD-10-CM

## 2023-12-01 DIAGNOSIS — N930 Postcoital and contact bleeding: Secondary | ICD-10-CM

## 2023-12-01 DIAGNOSIS — Z01411 Encounter for gynecological examination (general) (routine) with abnormal findings: Secondary | ICD-10-CM | POA: Diagnosis not present

## 2023-12-01 DIAGNOSIS — Z01419 Encounter for gynecological examination (general) (routine) without abnormal findings: Secondary | ICD-10-CM

## 2023-12-01 DIAGNOSIS — N809 Endometriosis, unspecified: Secondary | ICD-10-CM

## 2023-12-01 MED ORDER — TRIAMCINOLONE ACETONIDE 0.5 % EX OINT: 1.0000 | TOPICAL_OINTMENT | Freq: Two times a day (BID) | CUTANEOUS | 1 refills | Status: DC

## 2023-12-01 MED ORDER — NORETHINDRONE ACETATE 5 MG PO TABS: 5.0000 mg | ORAL_TABLET | Freq: Every day | ORAL | 1 refills | Status: DC

## 2023-12-01 NOTE — Progress Notes (Signed)
 ANNUAL EXAM Patient name: Crystal Mcknight MRN [id number]  Date of birth: 1975-07-06 Chief Complaint:   AEX  History of Present Illness:   Crystal Mcknight is a 48 y.o. G51P0012 Caucasian female being seen today for a routine annual exam. H/o diagnostic laparoscopy last fall.  Pt reports pain is improved but still with some symptoms.  She knows not all endometriosis was removed due to location.  Treatment options reviewed including oral progesterone, Mirena IUD, GNRH antagonist therapy.  Would like to start progesterone.    Has noted some post coital bleeding.  This is new.    Had abnormal appearing gallbladder at time of surgery.  Has undergone ultrasound, consult with general surgeon and MRI.  Final diagnosis adenomyomatosis of gall bladder.  No additional follow up needed.    Has itching from incisions that alternate depending on the month.    Dr. Brigitte drew her [facility].  This was 28.  Cycles are still regular.     Last pap 03/11/2022. Results were: NILM w/ HRHPV negative.  Last mammogram: 09/19/2023. Results were: normal. Family h/o breast cancer: yes mother Last colonoscopy: 11/2021. Results were: abnormal tubular adenomas, f/uu 3 years. Family h/o colorectal cancer: no     03/30/2023    2:15 PM 12/07/2022    3:45 PM 06/24/2022    1:33 PM 08/12/2020    2:22 PM 07/09/2019    4:09 PM  Depression screen PHQ 2/9  Decreased Interest 0 0 0 0 2  Down, Depressed, Hopeless 0 0 0 0 1  PHQ - 2 Score 0 0 0 0 3  Altered sleeping     1  Tired, decreased energy     3  Change in appetite     2  Feeling bad or failure about yourself      1  Trouble concentrating     1  Moving slowly or fidgety/restless     1  Suicidal thoughts     0  PHQ-9 Score     12    Review of Systems:   Pertinent items are noted in HPI Denies any bladder or bowel changes Pertinent History Reviewed:  Reviewed past medical,surgical, social and family history.  Reviewed problem list, medications and allergies. Physical  Assessment:   Vitals:   12/01/23 1030  BP: 94/69  Pulse: 93  SpO2: 98%  There is no height or weight on file to calculate BMI.        Physical Examination:   General appearance - well appearing, and in no distress  Mental status - alert, oriented to person, place, and time  Psych:  She has a normal mood and affect  Skin - warm and dry, normal color, no suspicious lesions noted  Chest - effort normal, all lung fields clear to auscultation bilaterally  Heart - normal rate and regular rhythm  Neck:  midline trachea, no thyromegaly or nodules  Breasts - breasts appear normal, no suspicious masses, no skin or nipple changes or  axillary nodes  Abdomen - soft, nontender, nondistended, no masses or organomegaly  Pelvic - VULVA: normal appearing vulva with no masses, tenderness or lesions, inferior incision just above pubic hair is erythematous today   VAGINA: normal appearing vagina with normal color and discharge, no lesions   CERVIX: normal appearing cervix without discharge or lesions, no CMT  Thin prep pap is obtained without HR HPV  UTERUS: uterus is felt to be normal size, shape, consistency and nontender   ADNEXA: No adnexal masses  or tenderness noted.  Rectal - normal rectal, good sphincter tone, no masses felt.  Extremities:  No swelling or varicosities noted  Chaperone present for exam  Assessment & Plan:  1. Well woman exam with routine gynecological exam (Primary) - Pap smear obtained today due to postcoital bleeding - Mammogram 09/21/2023 - Colonoscopy 12/09/2021 - Bone mineral density not indicated yet - lab work done with PCP, Dr. Larnell - vaccines reviewed/updated  2. Endometriosis - will start oral progesterone and plan follow up 4 months for recheck - norethindrone  (AYGESTIN ) 5 MG tablet; Take 1 tablet (5 mg total) by mouth daily.  Dispense: 90 tablet; Refill: 1  3. PCB (post coital bleeding) - Cytology - PAP( [facility]) - if negative, plan ultrasound  4.  Inflammation of operative incision - will treat with triamcinolone  ointment to see if this helps.    Meds:  Meds ordered this encounter  Medications   norethindrone  (AYGESTIN ) 5 MG tablet    Sig: Take 1 tablet (5 mg total) by mouth daily.    Dispense:  90 tablet    Refill:  1   triamcinolone  ointment (KENALOG ) 0.5 %    Sig: Apply 1 Application topically 2 (two) times daily.    Dispense:  30 g    Refill:  1    Follow-up: Return in about 4 months (around 04/02/2024) for h/o endometriosis.  Ronal GORMAN Pinal, MD 12/04/2023 9:35 PM

## 2023-12-02 DIAGNOSIS — J069 Acute upper respiratory infection, unspecified: Secondary | ICD-10-CM | POA: Diagnosis not present

## 2023-12-04 ENCOUNTER — Encounter (HOSPITAL_BASED_OUTPATIENT_CLINIC_OR_DEPARTMENT_OTHER): Payer: Self-pay | Admitting: Obstetrics & Gynecology

## 2023-12-07 ENCOUNTER — Ambulatory Visit (HOSPITAL_BASED_OUTPATIENT_CLINIC_OR_DEPARTMENT_OTHER): Payer: Self-pay | Admitting: Obstetrics & Gynecology

## 2023-12-07 LAB — CYTOLOGY - PAP
Adequacy: ABSENT
Comment: NEGATIVE
Diagnosis: NEGATIVE
High risk HPV: NEGATIVE

## 2023-12-26 ENCOUNTER — Encounter (HOSPITAL_BASED_OUTPATIENT_CLINIC_OR_DEPARTMENT_OTHER): Payer: Self-pay | Admitting: Obstetrics & Gynecology

## 2023-12-27 ENCOUNTER — Encounter (HOSPITAL_BASED_OUTPATIENT_CLINIC_OR_DEPARTMENT_OTHER): Payer: Self-pay | Admitting: Obstetrics & Gynecology

## 2024-01-25 ENCOUNTER — Other Ambulatory Visit

## 2024-01-25 DIAGNOSIS — E039 Hypothyroidism, unspecified: Secondary | ICD-10-CM | POA: Diagnosis not present

## 2024-01-26 ENCOUNTER — Ambulatory Visit: Payer: Self-pay | Admitting: Internal Medicine

## 2024-01-26 LAB — T4, FREE: Free T4: 1.6 ng/dL (ref 0.8–1.8)

## 2024-01-26 LAB — TSH: TSH: 0.57 m[IU]/L

## 2024-02-08 DIAGNOSIS — L821 Other seborrheic keratosis: Secondary | ICD-10-CM | POA: Diagnosis not present

## 2024-02-08 DIAGNOSIS — D225 Melanocytic nevi of trunk: Secondary | ICD-10-CM | POA: Diagnosis not present

## 2024-02-08 DIAGNOSIS — D2262 Melanocytic nevi of left upper limb, including shoulder: Secondary | ICD-10-CM | POA: Diagnosis not present

## 2024-02-08 DIAGNOSIS — D2271 Melanocytic nevi of right lower limb, including hip: Secondary | ICD-10-CM | POA: Diagnosis not present

## 2024-02-22 ENCOUNTER — Other Ambulatory Visit: Payer: Self-pay

## 2024-02-22 DIAGNOSIS — E039 Hypothyroidism, unspecified: Secondary | ICD-10-CM

## 2024-02-22 MED ORDER — LEVOTHYROXINE SODIUM 200 MCG PO TABS: ORAL_TABLET | ORAL | 3 refills | Status: DC

## 2024-04-03 ENCOUNTER — Encounter (HOSPITAL_BASED_OUTPATIENT_CLINIC_OR_DEPARTMENT_OTHER): Payer: Self-pay | Admitting: Obstetrics & Gynecology

## 2024-04-03 ENCOUNTER — Ambulatory Visit (HOSPITAL_BASED_OUTPATIENT_CLINIC_OR_DEPARTMENT_OTHER): Admitting: Obstetrics & Gynecology

## 2024-04-03 VITALS — BP 115/89 | HR 84 | Wt 144.0 lb

## 2024-04-03 DIAGNOSIS — Z9189 Other specified personal risk factors, not elsewhere classified: Secondary | ICD-10-CM | POA: Diagnosis not present

## 2024-04-03 DIAGNOSIS — Z803 Family history of malignant neoplasm of breast: Secondary | ICD-10-CM

## 2024-04-03 DIAGNOSIS — N809 Endometriosis, unspecified: Secondary | ICD-10-CM | POA: Diagnosis not present

## 2024-04-03 MED ORDER — VENLAFAXINE HCL ER 37.5 MG PO CP24: 37.5000 mg | ORAL_CAPSULE | Freq: Every day | ORAL | 2 refills | Status: AC

## 2024-04-03 NOTE — Progress Notes (Signed)
   GYNECOLOGY  VISIT  CC:   f/u after starting norethindrone    HPI: 48 y.o. G3P0012 Married White or Caucasian female here for follow up on medication regimen. Patient reports having irregular periods/spotting while on medication. She is no longer taking norethindrone . Stopped taking medication about two months ago. She reports that periods have been regular since stopping medication.  Report pain is manageable.  She is taking ibuprofen .  Reports she did have more symptoms during her luteal phase when she was on the progesterone.  For now, does not want to continue or restart this.  We did discuss additional breast imaging this next time it is due with contrast enhanced mammogram due to her person increased risks of breast.  We have discussed MRI in the past.  She is open to having this done for her screening this next year.  Also feels could benefit from starting Effexor.  Dosing reviewed.    LMP: 03/20/2024  Past Medical History:  Diagnosis Date   Abnormal Pap smear of cervix    Arthritis    hands,pt denies 11/11/21   Depression    Endometriosis    Hypothyroidism    Pelvic pain    PONV (postoperative nausea and vomiting)    Substance abuse (HCC)    ETOH,RECOVERY FOR 4 YEARS UPDATED 11/11/21   Thyroid  disease     MEDS:  Reviewed in EPIC  ALLERGIES: Other and Penicillins  SH:  married, non smoker  Review of Systems  Constitutional: Negative.   Genitourinary:        Pelvic pain    PHYSICAL EXAMINATION:    BP 115/89 (BP Location: Left Arm, Patient Position: Sitting, Cuff Size: Normal)   Pulse 84   Wt 144 lb (65.3 kg)   LMP 03/20/2024 (Exact Date)   SpO2 100%   BMI 22.55 kg/m     Physical Exam Constitutional:      Appearance: Normal appearance.  Neurological:     General: No focal deficit present.  Psychiatric:        Mood and Affect: Mood normal.      Assessment/Plan: 1. Family history of breast cancer (Primary) - contrast enhanced mammogram order for next  screening.  Scheduling discussed.  She understands this is typically not done until right before the month it is due. - MM 2D DIAG BILAT WITH CONTRAST BCG ONLY; Future  2. Increased risk of breast cancer  3. Endometriosis - for now will hold on any hormonal therapy due to side effects with norethindrone  - will start effexor 37.5mg .  Advised to give update in 1 month to see if increased dosage needed.

## 2024-05-25 ENCOUNTER — Encounter: Payer: Self-pay | Admitting: Internal Medicine

## 2024-05-25 DIAGNOSIS — E039 Hypothyroidism, unspecified: Secondary | ICD-10-CM

## 2024-05-25 MED ORDER — LEVOTHYROXINE SODIUM 200 MCG PO TABS: ORAL_TABLET | ORAL | 1 refills | Status: DC

## 2024-06-15 ENCOUNTER — Ambulatory Visit: Payer: BC Managed Care – PPO | Admitting: Internal Medicine

## 2024-06-21 ENCOUNTER — Encounter: Payer: Self-pay | Admitting: Internal Medicine

## 2024-06-21 ENCOUNTER — Ambulatory Visit: Admitting: Internal Medicine

## 2024-06-21 ENCOUNTER — Other Ambulatory Visit

## 2024-06-21 VITALS — BP 112/60 | HR 93 | Ht 67.0 in | Wt 141.6 lb

## 2024-06-21 DIAGNOSIS — E039 Hypothyroidism, unspecified: Secondary | ICD-10-CM

## 2024-06-21 NOTE — Progress Notes (Addendum)
 Patient ID: Crystal Mcknight, female   DOB: Mar 19, 1976, 49 y.o.   MRN: [id number]   HPI  Crystal Mcknight is a 49 y.o.-year-old female, referred by her PCP, Larnell Hamilton, MD returning for follow-up for of hypothyroidism. She moved to [place] from [place]  in 2009.  She was followed by endocrinology there.  Last visit with me 1 year ago.  Interim history: Since last visit, she feels that she developed perimenopause.  No significant hot flushes but has aches and pains, brain fog, insomnia. She has mild chronic constipation. She has changed from Zoloft  to Effexor  to hopefully help with the above symptoms. Since she has monthly menses (although at irregular intervals), she was suggested progesterone only by OB/GYN.  She tried this but it stopped her menstrual cycles and she did not feel good on it so she stopped it.  Reviewed and addended history: Pt. has been dx with hypothyroidism in 1990s (at 67-21 y/o). She also has a h/o postpartum thyroiditis after both pregnancies - last after the birth of her son.  She had a thyroid  ultrasound before moving from [place]  and it was mentioned that her thyroid  was not visible anymore.  She was previously on Synthroid  DAW 175 + 25 mcg, dose increased 06/21/2019.  At that time, she saw her OB/GYN Dr. and she had several symptoms including fatigue, constipation, hair loss. A TSH was checked and it was high.  2 days prior to our appointment in 06/2019 she woke up with vertigo >> saw PCP >> TFTs repeated: normal. She had vertigo 12 years ago >> investigated by MRI >> no pathology but she had appendicitis few days later so the vertigo was attributed to the acute episode.  We previously decreased the dose of her levothyroxine  to 150 mcg daily as her TSH was suppressed. She felt terrible on this dose (VERY tired) >> she increased to 175 mcg daily and her fatigue improved.  Since TFTs returns normal, we continued the same dose.  However, afterwards, her TSH returned  suppressed and we decrease dthe dose to 150 mcg daily.  Due to previous fatigue on this dose, she increased it back to 175 mcg daily.  Since TSH remained suppressed, again advised her to switch 150 mcg daily.  In 07/2021, we switched to 175 alternating with 150 mcg every other day.  In 08/2022, she had a TSH close to the upper limit of the target range in PCPs office so we increased the dose to 175 daily.  In 08/2023, we increased the dose further to 200 mcg daily.  She now takes generic levothyroxine : - in am (around 5-6 AM), dissolved in mouth - fasting - at least 30 min from b'fast - no calcium - no iron - + multivitamins, Mg, vitamin D  - at night - + B complex - last dose 2 days ago (30 mcg of biotin) - no PPIs  Reviewed her TFTs: Lab Results  Component Value Date   TSH 0.57 01/25/2024   TSH 3.38 06/16/2023   TSH 2.54 01/14/2023   TSH 3.20 06/07/2022   TSH 2.01 11/18/2021   TSH 4.79 08/05/2021   TSH 1.06 02/23/2021   TSH 0.91 12/04/2020   TSH 0.22 (L) 10/15/2020   TSH 1.35 06/04/2020   FREET4 1.6 01/25/2024   FREET4 1.3 06/16/2023   FREET4 1.12 01/14/2023   FREET4 0.96 06/07/2022   FREET4 1.14 11/18/2021   FREET4 0.93 08/05/2021   FREET4 0.94 02/23/2021   FREET4 1.00 12/04/2020   FREET4 1.13 10/15/2020  FREET4 0.98 06/04/2020  08/2023: TSH 5.02 (0.45-4.5) in PCPs office 08/2022: TSH 4.1 in PCPs office  Prev: Labs from [facility] -08/12/2020: TSH 0.349 (0.45-4.5) Vitamin D  69.4 HbA1c 5.5% Lipids: 16/92/51/88-fasting Glucose 89, BUN/creatinine 12/0.81, GFR 91, with the rest of the CMP normal We decreased  her levothyroxine  dose to 150 mcg daily. 06/21/2019: TSH 7.120 (0.45-4.5), total T4 7.2 (4.5-12) - dose increased from 175 to 200 mcg daily 07/29/2018: TSH 3.29  Her antithyroid antibodies were not elevated: Component     Latest Ref Rng & Units 07/11/2019  Thyroperoxidase Ab SerPl-aCnc     <9 IU/mL 1  Thyroglobulin Ab     < or = 1 IU/mL <1   In  2021, due to the increased levothyroxine  requirements and also due to the fact that she was complaining of bloating and she had family history of autoimmune diseases, we tested her for celiac disease.  The investigation was negative: Component     Latest Ref Rng & Units 07/11/2019  Immunoglobulin A     47 - 310 mg/dL 840  (tTG) Ab, IgG     U/mL 3  (tTG) Ab, IgA     U/mL 1  Gliadin IgA     Units 9  Deamidated Gliadin Abs, IgG     Units 1   In the past, she described fatigue, cold intolerance, constipation, hair loss on forearms, anxiety/depression.  She had normal menses but with increased flow and also history of endometriosis with increased cramping.  She continues to see OB/GYN.  Pt denies: - feeling nodules in neck - hoarseness - dysphagia - choking  She has + FH of thyroid  disorders in: father and P aunts. Father had Myasthenia Gravis and aunts with SLE. No FH of thyroid  cancer. No h/o radiation tx to head or neck. No recent steroids use.   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 She fell and fractured her L tibia when in [place] 04/15/2022 but this healed well and she was able to ski afterwards. She had lap sx for endometriosis - 02/2023.    ROS: + see HPI  I reviewed pt's medications, allergies, PMH, social hx, family hx, and changes were documented in the history of present illness. Otherwise, unchanged from my initial visit note.  Past Medical History:  Diagnosis Date   Abnormal Pap smear of cervix    Arthritis    hands,pt denies 11/11/21   Depression    Endometriosis    Hypothyroidism    Pelvic pain    PONV (postoperative nausea and vomiting)    Substance abuse (HCC)    ETOH,RECOVERY FOR 4 YEARS UPDATED 11/11/21   Thyroid  disease    Past Surgical History:  Procedure Laterality Date   APPENDECTOMY     BREAST LUMPECTOMY WITH RADIOACTIVE SEED LOCALIZATION Left 03/25/2022   Procedure: LEFT BREAST LUMPECTOMY WITH  RADIOACTIVE SEED LOCALIZATION;  Surgeon: Vernetta Berg, MD;  Location: [facility];  Service: General;  Laterality: Left;   LAPAROSCOPIC BILATERAL SALPINGECTOMY Bilateral 03/01/2023   Procedure: LAPAROSCOPIC BILATERAL SALPINGECTOMY;  Surgeon: Cleotilde Ronal RAMAN, MD;  Location: [facility] OR;  Service: Gynecology;  Laterality: Bilateral;   LAPAROSCOPY N/A 05/31/2013   Procedure: LAPAROSCOPY OPERATIVE, FULGURATION OF ENDOMETRIOSIS, REMOVAL OF BILATERAL FALLOPIAN TUBE CYST;  Surgeon: Truman Corona, MD;  Location: [facility];  Service: Gynecology;  Laterality: N/A;   LEEP  2013   OPEN REDUCTION INTERNAL FIXATION (ORIF) TIBIA/FIBULA FRACTURE Left 04/22/2022   RHINOPLASTY  WISDOM TOOTH EXTRACTION     Social History   Socioeconomic History   Marital status: Married    Spouse name: Not on file   Number of children: 2   Years of education: Not on file   Highest education level: Not on file  Occupational History   Not on file  Tobacco Use   Smoking status: Never Smoker   Smokeless tobacco: Never Used  Substance and Sexual Activity   Alcohol  use: No, stopped   Drug use: No   Sexual activity: Yes    Birth control/protection: None    Comment: husband vasectomy  Other Topics Concern   Not on file  Social History Narrative   Not on file   Social Determinants of Health   Financial Resource Strain:    Difficulty of Paying Living Expenses: Not on file  Food Insecurity:    Worried About Programme Researcher, Broadcasting/film/video in the Last Year: Not on file   [employer] of Food in the Last Year: Not on file  Transportation Needs:    Lack of Transportation (Medical): Not on file   Lack of Transportation (Non-Medical): Not on file  Physical Activity:    Days of Exercise per Week: Not on file   Minutes of Exercise per Session: Not on file  Stress:    Feeling of Stress : Not on file  Social Connections:    Frequency of Communication with Friends and Family: Not on file   Frequency of Social Gatherings with  Friends and Family: Not on file   Attends Religious Services: Not on file   Active Member of Clubs or Organizations: Not on file   Attends Banker Meetings: Not on file   Marital Status: Not on file  Intimate Partner Violence:    Fear of Current or Ex-Partner: Not on file   Emotionally Abused: Not on file   Physically Abused: Not on file   Sexually Abused: Not on file   Current Outpatient Medications  Medication Sig Dispense Refill   B Complex-C (B-COMPLEX WITH VITAMIN C) tablet Take 1 tablet by mouth daily.     Cholecalciferol (VITAMIN D3 PO) Take 1 tablet by mouth daily.     fluticasone (FLONASE) 50 MCG/ACT nasal spray Place 1-2 sprays into both nostrils daily as needed for allergies.     levothyroxine  (SYNTHROID ) 200 MCG tablet TAKE 1 TABLET(175 MCG) BY MOUTH EVERY DAY 90 tablet 1   loratadine (CLARITIN) 10 MG tablet Take 10 mg by mouth daily as needed for allergies.     magnesium 30 MG tablet Take 30 mg by mouth 2 (two) times daily.     Multiple Vitamins-Minerals (MULTIVITAMIN ADULT EXTRA C PO) Take 1 tablet by mouth daily.     norethindrone  (AYGESTIN ) 5 MG tablet Take 1 tablet (5 mg total) by mouth daily. (Patient not taking: Reported on 04/03/2024) 90 tablet 1   RETIN-A 0.025 % cream Apply 1 Application topically 3 (three) times a week. (Patient not taking: Reported on 12/01/2023)     traZODone  (DESYREL ) 50 MG tablet Take at bedtime as directed 90 tablet 1   triamcinolone  ointment (KENALOG ) 0.5 % Apply 1 Application topically 2 (two) times daily. 30 g 1   venlafaxine  XR (EFFEXOR -XR) 37.5 MG 24 hr capsule Take 1 capsule (37.5 mg total) by mouth daily. 30 capsule 2   No current facility-administered medications for this visit.    Allergies  Allergen Reactions   Other Rash    Reaction to skin glue with  laparoscopy 02/2023.    Penicillins Rash    When she was 49years old, had a rash when took this for strep throat   Family History  Problem Relation Age of Onset    Breast cancer Mother 8   Colon polyps Mother    Hyperlipidemia Mother    Colon polyps Father        >10   Hyperlipidemia Father    Hypertension Father    Diverticulosis Father    Kidney cancer Paternal Uncle        d.>50   Breast cancer Maternal Grandmother        dx 20s   Heart disease Maternal Grandfather    Stomach cancer Paternal Grandmother    Ovarian cancer Paternal Grandmother        d. 79s   Heart disease Paternal Grandfather    Breast cancer Other        dx 57s   Breast cancer Maternal Great-grandmother    Breast cancer Cousin        dx 30s   Colon cancer Neg Hx    Crohn's disease Neg Hx    Esophageal cancer Neg Hx    Rectal cancer Neg Hx   Also, breast cancer in mother. + see HPI  PE: BP 112/60   Pulse 93   Ht 5' 7 (1.702 m)   Wt 141 lb 9.6 oz (64.2 kg)   SpO2 97%   BMI 22.18 kg/m  Wt Readings from Last 10 Encounters:  06/21/24 141 lb 9.6 oz (64.2 kg)  04/03/24 144 lb (65.3 kg)  06/16/23 144 lb 9.6 oz (65.6 kg)  03/30/23 122 lb 12.8 oz (55.7 kg)  03/10/23 145 lb 3.2 oz (65.9 kg)  03/01/23 143 lb (64.9 kg)  02/23/23 143 lb 11.2 oz (65.2 kg)  12/07/22 142 lb (64.4 kg)  06/24/22 140 lb (63.5 kg)  06/07/22 141 lb (64 kg)   Constitutional: Normal weight, in NAD Eyes:  EOMI, no exophthalmos ENT: no neck masses, no cervical lymphadenopathy Cardiovascular: RRR, no tachycardia at the time of the exam, No MRG Respiratory: CTA B Musculoskeletal: no deformities Skin:no rashes Neurological: no tremor with outstretched hands  ASSESSMENT: 1. Hypothyroidism  PLAN:  1. Patient with longstanding hypothyroidism on levothyroxine  therapy.  She was previously on brand-name Synthroid  but this was not covered by her insurance anymore so she is currently on generic levothyroxine . - Her TPO and ATA antibodies were not elevated to suggest Hashimoto's hypothyroidism - Her TFTs were fluctuating in the past but the TSH was normal at last check: Lab Results  Component  Value Date   TSH 0.57 01/25/2024  - she continues on LT4 200 mcg daily - pt feels good on this dose, but does have some symptoms that could be related to perimenopause (see HPI). - we discussed about taking the thyroid  hormone every day, with water, >30 minutes before breakfast, separated by >4 hours from acid reflux medications, calcium, iron, multivitamins. Pt. is taking it correctly. - she is on a B complex with 30 mcg of biotin daily.  We discussed that this is quite a low dose that likely does not affect the thyroid  results. - will check thyroid  tests today: TSH and fT4 - If labs are abnormal, she will need to return for repeat TFTs in 1.5 months - OTW, I will see her back in a year  Needs refills to reset her prescription for another year.  Component     Latest Ref Rng 06/21/2024  TSH  mIU/L 0.44   T4,Free(Direct)     0.8 - 1.8 ng/dL 1.7   TFTs are normal.  The TSH is close to the lower limit of normal so I will advise her to return for labs in 6 months.  Lela Fendt, MD PhD [facility]

## 2024-06-21 NOTE — Patient Instructions (Signed)
 Please continue Levothyroxine  200 mcg every day.  Take the thyroid  hormone every day, with water, at least 30 minutes before breakfast, separated by at least 4 hours from: - acid reflux medications - calcium - iron - multivitamins  Please stop at the lab.  You should have an endocrinology follow-up appointment in 1 year.

## 2024-06-22 ENCOUNTER — Ambulatory Visit: Payer: Self-pay | Admitting: Internal Medicine

## 2024-06-22 LAB — TSH: TSH: 0.44 m[IU]/L

## 2024-06-22 LAB — T4, FREE: Free T4: 1.7 ng/dL (ref 0.8–1.8)

## 2024-06-22 MED ORDER — LEVOTHYROXINE SODIUM 200 MCG PO TABS: ORAL_TABLET | ORAL | 3 refills | Status: AC

## 2024-06-22 NOTE — Addendum Note (Signed)
 Addended by: TRIXIE FILE on: 06/22/2024 12:38 PM   Modules accepted: Orders

## 2024-06-26 ENCOUNTER — Other Ambulatory Visit

## 2024-12-10 ENCOUNTER — Ambulatory Visit: Admitting: Internal Medicine

## 2025-06-20 ENCOUNTER — Ambulatory Visit: Admitting: Internal Medicine
